# Patient Record
Sex: Female | Born: 1960 | Race: White | Hispanic: No | Smoking: Current every day smoker
Health system: Southern US, Community
[De-identification: ages and names within clinical notes are randomized; demographics above are authoritative.]

## PROBLEM LIST (undated history)

## (undated) DIAGNOSIS — M549 Dorsalgia, unspecified: Secondary | ICD-10-CM

## (undated) DIAGNOSIS — M1990 Unspecified osteoarthritis, unspecified site: Secondary | ICD-10-CM

## (undated) DIAGNOSIS — M79604 Pain in right leg: Secondary | ICD-10-CM

## (undated) DIAGNOSIS — E785 Hyperlipidemia, unspecified: Secondary | ICD-10-CM

## (undated) DIAGNOSIS — J449 Chronic obstructive pulmonary disease, unspecified: Secondary | ICD-10-CM

## (undated) DIAGNOSIS — M25559 Pain in unspecified hip: Secondary | ICD-10-CM

## (undated) DIAGNOSIS — F419 Anxiety disorder, unspecified: Secondary | ICD-10-CM

## (undated) DIAGNOSIS — R0600 Dyspnea, unspecified: Secondary | ICD-10-CM

## (undated) DIAGNOSIS — F329 Major depressive disorder, single episode, unspecified: Secondary | ICD-10-CM

## (undated) DIAGNOSIS — B009 Herpesviral infection, unspecified: Secondary | ICD-10-CM

## (undated) DIAGNOSIS — Z720 Tobacco use: Secondary | ICD-10-CM

## (undated) DIAGNOSIS — M5430 Sciatica, unspecified side: Secondary | ICD-10-CM

## (undated) DIAGNOSIS — F32A Depression, unspecified: Secondary | ICD-10-CM

## (undated) HISTORY — DX: Anxiety disorder, unspecified: F41.9

## (undated) HISTORY — DX: Hyperlipidemia, unspecified: E78.5

## (undated) HISTORY — DX: Dorsalgia, unspecified: M54.9

## (undated) HISTORY — DX: Herpesviral infection, unspecified: B00.9

## (undated) HISTORY — PX: BUNIONECTOMY: SHX129

## (undated) HISTORY — PX: COLONOSCOPY W/ POLYPECTOMY: SHX1380

## (undated) HISTORY — DX: Depression, unspecified: F32.A

## (undated) HISTORY — DX: Pain in unspecified hip: M25.559

## (undated) HISTORY — PX: ELBOW SURGERY: SHX618

## (undated) HISTORY — DX: Pain in right leg: M79.604

## (undated) HISTORY — DX: Tobacco use: Z72.0

## (undated) HISTORY — PX: NOVASURE ABLATION: SHX5394

## (undated) HISTORY — DX: Sciatica, unspecified side: M54.30

## (undated) HISTORY — DX: Major depressive disorder, single episode, unspecified: F32.9

## (undated) HISTORY — PX: RHYTIDECTOMY NECK / CHEEK / CHIN: SUR1286

## (undated) HISTORY — PX: TONSILLECTOMY: SUR1361

## (undated) HISTORY — PX: TUBAL LIGATION: SHX77

## (undated) HISTORY — PX: CATARACT EXTRACTION: SUR2

---

## 1998-01-04 ENCOUNTER — Other Ambulatory Visit: Admission: RE | Admit: 1998-01-04 | Discharge: 1998-01-04 | Payer: Self-pay | Admitting: Family Medicine

## 1998-11-09 ENCOUNTER — Other Ambulatory Visit: Admission: RE | Admit: 1998-11-09 | Discharge: 1998-11-09 | Payer: Self-pay | Admitting: Podiatry

## 2001-03-05 ENCOUNTER — Other Ambulatory Visit: Admission: RE | Admit: 2001-03-05 | Discharge: 2001-03-05 | Payer: Self-pay | Admitting: Family Medicine

## 2001-03-24 ENCOUNTER — Encounter: Payer: Self-pay | Admitting: Obstetrics and Gynecology

## 2001-03-24 ENCOUNTER — Ambulatory Visit (HOSPITAL_COMMUNITY): Admission: RE | Admit: 2001-03-24 | Discharge: 2001-03-24 | Payer: Self-pay | Admitting: Obstetrics and Gynecology

## 2001-05-07 ENCOUNTER — Encounter (INDEPENDENT_AMBULATORY_CARE_PROVIDER_SITE_OTHER): Payer: Self-pay | Admitting: Specialist

## 2001-05-07 ENCOUNTER — Ambulatory Visit (HOSPITAL_COMMUNITY): Admission: RE | Admit: 2001-05-07 | Discharge: 2001-05-07 | Payer: Self-pay | Admitting: Obstetrics and Gynecology

## 2002-05-06 ENCOUNTER — Other Ambulatory Visit: Admission: RE | Admit: 2002-05-06 | Discharge: 2002-05-06 | Payer: Self-pay | Admitting: Obstetrics and Gynecology

## 2002-05-19 ENCOUNTER — Encounter: Payer: Self-pay | Admitting: Obstetrics and Gynecology

## 2002-05-19 ENCOUNTER — Ambulatory Visit (HOSPITAL_COMMUNITY): Admission: RE | Admit: 2002-05-19 | Discharge: 2002-05-19 | Payer: Self-pay | Admitting: Obstetrics and Gynecology

## 2002-05-26 ENCOUNTER — Encounter: Payer: Self-pay | Admitting: Obstetrics and Gynecology

## 2002-05-26 ENCOUNTER — Encounter: Admission: RE | Admit: 2002-05-26 | Discharge: 2002-05-26 | Payer: Self-pay | Admitting: Obstetrics and Gynecology

## 2005-12-29 ENCOUNTER — Emergency Department (HOSPITAL_COMMUNITY): Admission: EM | Admit: 2005-12-29 | Discharge: 2005-12-29 | Payer: Self-pay | Admitting: Emergency Medicine

## 2006-06-30 ENCOUNTER — Emergency Department (HOSPITAL_COMMUNITY): Admission: EM | Admit: 2006-06-30 | Discharge: 2006-06-30 | Payer: Self-pay | Admitting: Emergency Medicine

## 2006-09-20 ENCOUNTER — Encounter (INDEPENDENT_AMBULATORY_CARE_PROVIDER_SITE_OTHER): Payer: Self-pay | Admitting: Obstetrics and Gynecology

## 2006-09-20 ENCOUNTER — Ambulatory Visit (HOSPITAL_COMMUNITY): Admission: RE | Admit: 2006-09-20 | Discharge: 2006-09-20 | Payer: Self-pay | Admitting: Obstetrics and Gynecology

## 2007-07-28 ENCOUNTER — Emergency Department (HOSPITAL_COMMUNITY): Admission: EM | Admit: 2007-07-28 | Discharge: 2007-07-29 | Payer: Self-pay | Admitting: Emergency Medicine

## 2007-07-30 ENCOUNTER — Ambulatory Visit: Payer: Self-pay | Admitting: Cardiovascular Disease

## 2007-08-18 ENCOUNTER — Encounter: Payer: Self-pay | Admitting: Cardiovascular Disease

## 2007-08-18 ENCOUNTER — Ambulatory Visit: Payer: Self-pay

## 2008-06-18 ENCOUNTER — Encounter: Payer: Self-pay | Admitting: Internal Medicine

## 2009-03-12 ENCOUNTER — Emergency Department (HOSPITAL_COMMUNITY): Admission: EM | Admit: 2009-03-12 | Discharge: 2009-03-12 | Payer: Self-pay | Admitting: Emergency Medicine

## 2009-05-17 ENCOUNTER — Encounter (INDEPENDENT_AMBULATORY_CARE_PROVIDER_SITE_OTHER): Payer: Self-pay | Admitting: *Deleted

## 2009-06-16 ENCOUNTER — Ambulatory Visit: Payer: Self-pay | Admitting: Gastroenterology

## 2009-06-16 DIAGNOSIS — K625 Hemorrhage of anus and rectum: Secondary | ICD-10-CM | POA: Insufficient documentation

## 2010-01-31 ENCOUNTER — Encounter (INDEPENDENT_AMBULATORY_CARE_PROVIDER_SITE_OTHER): Payer: Self-pay | Admitting: *Deleted

## 2010-02-12 ENCOUNTER — Encounter: Payer: Self-pay | Admitting: Obstetrics and Gynecology

## 2010-02-21 NOTE — Letter (Signed)
Summary: [facility] Instructions  [facility]  [address] [place], [place] [address]   Phone: [phone]  Fax: [phone]       [name]    49-Nov-1962    MRN: [id number]        Procedure Day /Date:FRIDAY 07/01/2009     Arrival Time:8AM     Procedure Time:9AM     Location of Procedure:                    X   [facility] (4th Floor)   PREPARATION FOR COLONOSCOPY WITH MOVIPREP   Starting 5 days prior to your procedure 06/26/2009 do not eat nuts, seeds, popcorn, corn, beans, peas,  salads, or any raw vegetables.  Do not take any fiber supplements (e.g. Metamucil, Citrucel, and Benefiber).  THE DAY BEFORE YOUR PROCEDURE         DATE:06/30/2009  LKG:[id number]  1.  Drink clear liquids the entire day-NO SOLID FOOD  2.  Do not drink anything colored red or purple.  Avoid juices with pulp.  No orange juice.  3.  Drink at least 64 oz. (8 glasses) of fluid/clear liquids during the day to prevent dehydration and help the prep work efficiently.  CLEAR LIQUIDS INCLUDE: Water Jello Ice Popsicles Tea (sugar ok, no milk/cream) Powdered fruit flavored drinks Coffee (sugar ok, no milk/cream) Gatorade Juice: apple, white grape, white cranberry  Lemonade Clear bullion, consomm, broth Carbonated beverages (any kind) Strained chicken noodle soup Hard Candy                             4.  In the morning, mix first dose of MoviPrep solution:    Empty 1 Pouch A and 1 Pouch B into the disposable container    Add lukewarm drinking water to the top line of the container. Mix to dissolve    Refrigerate (mixed solution should be used within 24 hrs)  5.  Begin drinking the prep at 5:00 p.m. The MoviPrep container is divided by 4 marks.   Every 15 minutes drink the solution down to the next mark (approximately 8 oz) until the full liter is complete.   6.  Follow completed prep with 16 oz of clear liquid of your choice (Nothing red or purple).  Continue to  drink clear liquids until bedtime.  7.  Before going to bed, mix second dose of MoviPrep solution:    Empty 1 Pouch A and 1 Pouch B into the disposable container    Add lukewarm drinking water to the top line of the container. Mix to dissolve    Refrigerate  THE DAY OF YOUR PROCEDURE      DATE: 07/01/2009 DAY: FRIDAY  Beginning at 4a.m. (5 hours before procedure):         1. Every 15 minutes, drink the solution down to the next mark (approx 8 oz) until the full liter is complete.  2. Follow completed prep with 16 oz. of clear liquid of your choice.    3. You may drink clear liquids until 7AM (2 HOURS BEFORE PROCEDURE).   MEDICATION INSTRUCTIONS  Unless otherwise instructed, you should take regular prescription medications with a small sip of water   as early as possible the morning of your procedure.        OTHER INSTRUCTIONS  You will need a responsible adult at least 49 years of age to accompany you and drive you  home.   This person must remain in the waiting room during your procedure.  Wear loose fitting clothing that is easily removed.  Leave jewelry and other valuables at home.  However, you may wish to bring a book to read or  an iPod/MP3 player to listen to music as you wait for your procedure to start.  Remove all body piercing jewelry and leave at home.  Total time from sign-in until discharge is approximately 2-3 hours.  You should go home directly after your procedure and rest.  You can resume normal activities the  day after your procedure.  The day of your procedure you should not:   Drive   Make legal decisions   Operate machinery   Drink alcohol   Return to work  You will receive specific instructions about eating, activities and medications before you leave.    The above instructions have been reviewed and explained to me by   _______________________    I fully understand and can verbalize these instructions  _____________________________ Date _________

## 2010-02-21 NOTE — Letter (Signed)
Summary: New Patient letter  [facility]  [address] [place], [place] [address]   Phone: [phone]  Fax: [phone]       05/17/2009 MRN: [id number]  [name] [address] [place], [place]  [address]  Dear Ms. Hoos,  Welcome to the Gastroenterology Division at [facility].    You are scheduled to see Dr. Arlyce Dice on 06/15/2009 at 9:00AM on the 3rd floor at [facility], [address].  We ask that you try to arrive at our office 15 minutes prior to your appointment time to allow for check-in.  We would like you to complete the enclosed self-administered evaluation form prior to your visit and bring it with you on the day of your appointment.  We will review it with you.  Also, please bring a complete list of all your medications or, if you prefer, bring the medication bottles and we will list them.  Please bring your insurance card so that we may make a copy of it.  If your insurance requires a referral to see a specialist, please bring your referral form from your primary care physician.  Co-payments are due at the time of your visit and may be paid by cash, check or credit card. *CO-PAY FOR MEDICAID IS $3.00     Your office visit will consist of a consult with your physician (includes a physical exam), any laboratory testing he/she may order, scheduling of any necessary diagnostic testing (e.g. x-ray, ultrasound, CT-scan), and scheduling of a procedure (e.g. Endoscopy, Colonoscopy) if required.  Please allow enough time on your schedule to allow for any/all of these possibilities.    If you cannot keep your appointment, please call [phone] to cancel or reschedule prior to your appointment date.  This allows [place] the opportunity to schedule an appointment for another patient in need of care.  If you do not cancel or reschedule by 5 p.m. the business day prior to your appointment date, you will be charged a $50.00 late cancellation/no-show fee.     Thank you for choosing [facility] for your medical needs.  We appreciate the opportunity to care for you.  Please visit [place] at our website  to learn more about our practice.                     Sincerely,                                                             The Gastroenterology Division

## 2010-02-21 NOTE — Letter (Signed)
Summary: Pre Visit No Show Letter  [facility]  [address] [place], [place] [address]   Phone: [phone]  Fax: [phone]        Jun 18, 2008 MRN: [id number]    [name] [address] [place], [place]  [address]    Dear Ms. Rigsby,   We have been unable to reach you by phone concerning the pre-procedure visit that you missed on 06/18/2008. For this reason,your procedure scheduled on 07/02/2008 has been cancelled. Our scheduling staff will gladly assist you with rescheduling your appointments at a more convenient time. Please call our office at [phone] between the hours of 8:00am and 5:00pm, press option #2 to reach an appointment scheduler. Please consider updating your contact numbers at this time so that we can reach you by phone in the future with schedule changes or results.    Thank you,    Wyona Almas RN [facility]

## 2010-02-21 NOTE — Assessment & Plan Note (Signed)
Summary: discuss colon//weight loss--ch.   History of Present Illness Visit Type: new patient  Primary GI MD: Melvia Heaps MD [facility] Primary Provider: [facility]  Requesting Provider: na Chief Complaint: Consult colon. Pt c/o weight loss and rectal bleeding  History of Present Illness:   [name] is a 50 year old white female referred at the request of Dr. Christell Constant for colonoscopy.  Family history is pertinent for her 54 year old sister who had colon cancer.  [name] has noted episodes of painless bright red blood per rectum consistenting of blood mixed with the stools and on the toilet tissue,  which she attributes to hemorrhoids.  She denies change in bowel habits, abdominal or rectal pain.   GI Review of Systems    Reports bloating, chest pain, and  loss of appetite.      Denies abdominal pain, acid reflux, belching, dysphagia with liquids, dysphagia with solids, heartburn, nausea, vomiting, vomiting blood, weight loss, and  weight gain.      Reports hemorrhoids and  rectal pain.     Denies anal fissure, black tarry stools, change in bowel habit, constipation, diarrhea, diverticulosis, fecal incontinence, heme positive stool, irritable bowel syndrome, jaundice, light color stool, liver problems, and  rectal bleeding.    Current Medications (verified): 1)  Simvastatin 80 Mg Tabs (Simvastatin) .... 1/2 Tablet At Bedtime 2)  Zoloft 100 Mg Tabs (Sertraline Hcl) .... 1/2 Once Daily 3)  Xanax 1 Mg Tabs (Alprazolam) .... 1/2 Every 8-12 Hours As Needed 4)  Aspirin 81 Mg Tbec (Aspirin) .... One Tablet By Mouth Once Daily  Allergies (verified): 1)  ! Codeine 2)  ! Demerol  Past History:  Past Medical History: Anxiety Disorder Depression Hyperlipidemia  Past Surgical History: 2 C-Section Right Arm Surgery  Tonsillectomy Surgery on Mouth x 2   Family History: Family History of Colon Cancer: SIster  Family History of Irritable Bowel Syndrome:Sister    Family History of Diabetes: Paternal and Maternal Side of family  Social History: Occupation: Unemployed Divorced 2 childern Patient currently smokes.  Alcohol Use - no Daily Caffeine Use: 12 glasses  daily  Illicit Drug Use - no Smoking Status:  current Drug Use:  no  Review of Systems       The patient complains of anxiety-new, blood in urine, change in vision, fatigue, headaches-new, hearing problems, itching, night sweats, shortness of breath, skin rash, sleeping problems, sore throat, thirst - excessive, urination - excessive, urine leakage, and vision changes.  The patient denies allergy/sinus, anemia, arthritis/joint pain, back pain, breast changes/lumps, confusion, cough, coughing up blood, depression-new, fainting, fever, heart murmur, heart rhythm changes, menstrual pain, muscle pains/cramps, nosebleeds, pregnancy symptoms, swelling of feet/legs, swollen lymph glands, thirst - excessive , urination - excessive , urination changes/pain, and voice change.         All other systems were reviewed and were negative   Vital Signs:  Patient profile:   50 year old female Height:      63 inches Weight:      137 pounds BMI:     24.36 BSA:     1.65 Pulse rate:   76 / minute Pulse rhythm:   regular BP sitting:   98 / 64  (left arm) Cuff size:   regular  Vitals Entered By: Ok Anis CMA (Jun 16, 2009 9:36 AM)  Physical Exam  Additional Exam:  on physical exam she is a healthy-appearing female  skin: anicteric HEENT: normocephalic; PEERLA; no nasal or pharyngeal abnormalities neck: supple nodes:  no cervical lymphadenopathy chest: clear to ausculatation and percussion heart: no murmurs, gallops, or rubs abd: soft, nontender; BS normoactive; no abdominal masses, tenderness, organomegaly rectal: deferred ext: no cynanosis, clubbing, edema skeletal: no deformities neuro: oriented x 3; no focal abnormalities    Impression & Recommendations:  Problem # 1:  FM HX  MALIGNANT NEOPLASM GASTROINTESTINAL TRACT (ICD-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  Patient's questions were answered.  Orders: Colonoscopy (Colon)  Problem # 2:  HEMORRHAGE OF RECTUM AND ANUS (ICD-569.3)  By history this is probably hemorrhoidal bleeding.  A more proximal colonic bleeding source should be ruled out.  Recommendations #1 colonoscopy  Orders: Colonoscopy (Colon)  Patient Instructions: 1)  Colonoscopy and Flexible Sigmoidoscopy brochure given.  2)  Conscious Sedation brochure given.  3)  Your Colonoscopy is scheduled for 07/01/2009 at 9am on the 4th floor 4)  We are sending in your MoviPrep to your pharmacy today 5)  CC Dr. Vernon Prey 6)  The medication list was reviewed and reconciled.  All changed / newly prescribed medications were explained.  A complete medication list was provided to the patient / caregiver. Prescriptions: MOVIPREP 100 GM  SOLR (PEG-KCL-NACL-NASULF-NA ASC-C) As per prep instructions.  #1 x 0   Entered by:   Merri Ray CMA (AAMA)   Authorized by:   Louis Meckel MD   Signed by:   Merri Ray CMA (AAMA) on 06/16/2009   Method used:   Electronically to        [facility] [phone]* (retail)       [address]       [place], [place]  [address]       Ph: [phone] or [phone]       Fax: [phone]   RxID:   [id number]

## 2010-02-21 NOTE — Letter (Signed)
Summary: Results Letter  [facility]  [address] [place], [place] [address]   Phone: [phone]  Fax: [phone]        Jun 16, 2009 MRN: [id number]    [name] [address] [place], [place]  [address]    Dear [name],  It is my pleasure to have treated you recently as a new patient in my office. I appreciate your confidence and the opportunity to participate in your care.  Since I do have a busy inpatient endoscopy schedule and office schedule, my office hours vary weekly. I am, however, available for emergency calls everyday through my office. If I am not available for an urgent office appointment, another one of our gastroenterologist will be able to assist you.  My well-trained staff are prepared to help you at all times. For emergencies after office hours, a physician from our Gastroenterology section is always available through my 24 hour answering service  Once again I welcome you as a new patient and I look forward to a happy and healthy relationship             Sincerely,  Louis Meckel MD  This letter has been electronically signed by your physician.  Appended Document: Results Letter letter mailed

## 2010-02-23 ENCOUNTER — Encounter: Payer: Self-pay | Admitting: Gastroenterology

## 2010-02-23 ENCOUNTER — Ambulatory Visit: Admit: 2010-02-23 | Payer: Self-pay | Admitting: Gastroenterology

## 2010-02-23 NOTE — Letter (Signed)
Summary: Pre Visit Letter Revised  [facility]  [address] [place], [place] [address]   Phone: [phone]  Fax: [phone]        01/31/2010 MRN: [id number] [name] [address] [place], [place]  [address]             Procedure Date:  03-09-10   Welcome to the Gastroenterology Division at [facility].    You are scheduled to see a nurse for your pre-procedure visit on 02-23-10 at 1:30p.m. on the 3rd floor at [facility], [address].  We ask that you try to arrive at our office 15 minutes prior to your appointment time to allow for check-in.  Please take a minute to review the attached form.  If you answer "Yes" to one or more of the questions on the first page, we ask that you call the person listed at your earliest opportunity.  If you answer "No" to all of the questions, please complete the rest of the form and bring it to your appointment.    Your nurse visit will consist of discussing your medical and surgical history, your immediate family medical history, and your medications.   If you are unable to list all of your medications on the form, please bring the medication bottles to your appointment and we will list them.  We will need to be aware of both prescribed and over the counter drugs.  We will need to know exact dosage information as well.    Please be prepared to read and sign documents such as consent forms, a financial agreement, and acknowledgement forms.  If necessary, and with your consent, a friend or relative is welcome to sit-in on the nurse visit with you.  Please bring your insurance card so that we may make a copy of it.  If your insurance requires a referral to see a specialist, please bring your referral form from your primary care physician.  No co-pay is required for this nurse visit.     If you cannot keep your appointment, please call [phone] to cancel or reschedule prior to your appointment date.  This allows  [place] the opportunity to schedule an appointment for another patient in need of care.    Thank you for choosing [facility] for your medical needs.  We appreciate the opportunity to care for you.  Please visit [place] at our website  to learn more about our practice.  Sincerely, The Gastroenterology Division

## 2010-03-01 NOTE — Letter (Signed)
Summary: Pre Visit Letter Revised  [facility]  [address] [place], [place] [address]   Phone: [phone]  Fax: [phone]        02/23/2010 MRN: [id number]  [name] [address] [place], [place]  [address]             Procedure Date:  03-23-10 10am            Dr Arlyce Dice - Direct Colon   Welcome to the Gastroenterology Division at [facility].    You are scheduled to see a nurse for your pre-procedure visit on 03-06-10 at 1pm on the 3rd floor at [facility], [address].  We ask that you try to arrive at our office 15 minutes prior to your appointment time to allow for check-in.  Please take a minute to review the attached form.  If you answer "Yes" to one or more of the questions on the first page, we ask that you call the person listed at your earliest opportunity.  If you answer "No" to all of the questions, please complete the rest of the form and bring it to your appointment.    Your nurse visit will consist of discussing your medical and surgical history, your immediate family medical history, and your medications.   If you are unable to list all of your medications on the form, please bring the medication bottles to your appointment and we will list them.  We will need to be aware of both prescribed and over the counter drugs.  We will need to know exact dosage information as well.    Please be prepared to read and sign documents such as consent forms, a financial agreement, and acknowledgement forms.  If necessary, and with your consent, a friend or relative is welcome to sit-in on the nurse visit with you.  Please bring your insurance card so that we may make a copy of it.  If your insurance requires a referral to see a specialist, please bring your referral form from your primary care physician.  No co-pay is required for this nurse visit.     If you cannot keep your appointment, please call [phone] to cancel or reschedule prior  to your appointment date.  This allows [place] the opportunity to schedule an appointment for another patient in need of care.    Thank you for choosing [facility] for your medical needs.  We appreciate the opportunity to care for you.  Please visit [place] at our website  to learn more about our practice.  Sincerely, The Gastroenterology Division

## 2010-03-06 ENCOUNTER — Encounter (INDEPENDENT_AMBULATORY_CARE_PROVIDER_SITE_OTHER): Payer: Self-pay | Admitting: *Deleted

## 2010-03-08 ENCOUNTER — Encounter (INDEPENDENT_AMBULATORY_CARE_PROVIDER_SITE_OTHER): Payer: Self-pay | Admitting: *Deleted

## 2010-03-09 ENCOUNTER — Other Ambulatory Visit: Payer: Self-pay | Admitting: Gastroenterology

## 2010-03-09 ENCOUNTER — Encounter: Payer: Self-pay | Admitting: Gastroenterology

## 2010-03-15 NOTE — Letter (Signed)
Summary: [facility] Instructions  [facility]  [address] [place], [place] [address]   Phone: [phone]  Fax: [phone]       [name]    1960/02/18    MRN: [id number]        Procedure Day Dorna Bloom:  Lenor Coffin  03/23/10     Arrival Time:  8:00AM     Procedure Time:  9:00AM     Location of Procedure:                    _ X_   [facility]oscopy Center (4th Floor)                      PREPARATION FOR COLONOSCOPY WITH MOVIPREP   Starting 5 days prior to your procedure 03/18/10 do not eat nuts, seeds, popcorn, corn, beans, peas,  salads, or any raw vegetables.  Do not take any fiber supplements (e.g. Metamucil, Citrucel, and Benefiber).  THE DAY BEFORE YOUR PROCEDURE         DATE: 03/22/10  DAY: WEDNESDAY  1.  Drink clear liquids the entire day-NO SOLID FOOD  2.  Do not drink anything colored red or purple.  Avoid juices with pulp.  No orange juice.  3.  Drink at least 64 oz. (8 glasses) of fluid/clear liquids during the day to prevent dehydration and help the prep work efficiently.  CLEAR LIQUIDS INCLUDE: Water Jello Ice Popsicles Tea (sugar ok, no milk/cream) Powdered fruit flavored drinks Coffee (sugar ok, no milk/cream) Gatorade Juice: apple, white grape, white cranberry  Lemonade Clear bullion, consomm, broth Carbonated beverages (any kind) Strained chicken noodle soup Hard Candy                             4.  In the morning, mix first dose of MoviPrep solution:    Empty 1 Pouch A and 1 Pouch B into the disposable container    Add lukewarm drinking water to the top line of the container. Mix to dissolve    Refrigerate (mixed solution should be used within 24 hrs)  5.  Begin drinking the prep at 5:00 p.m. The MoviPrep container is divided by 4 marks.   Every 15 minutes drink the solution down to the next mark (approximately 8 oz) until the full liter is complete.   6.  Follow completed prep with 16 oz of clear liquid of your choice (Nothing  red or purple).  Continue to drink clear liquids until bedtime.  7.  Before going to bed, mix second dose of MoviPrep solution:    Empty 1 Pouch A and 1 Pouch B into the disposable container    Add lukewarm drinking water to the top line of the container. Mix to dissolve    Refrigerate  THE DAY OF YOUR PROCEDURE      DATE: 03/23/10  DAY: THURSDAY  Beginning at 4:00AM (5 hours before procedure):         1. Every 15 minutes, drink the solution down to the next mark (approx 8 oz) until the full liter is complete.  2. Follow completed prep with 16 oz. of clear liquid of your choice.    3. You may drink clear liquids until 7:00AM (2 HOURS BEFORE PROCEDURE).   MEDICATION INSTRUCTIONS  Unless otherwise instructed, you should take regular prescription medications with a small sip of water   as early as possible the morning of your  procedure.        OTHER INSTRUCTIONS  You will need a responsible adult at least 49 years of age to accompany you and drive you home.   This person must remain in the waiting room during your procedure.  Wear loose fitting clothing that is easily removed.  Leave jewelry and other valuables at home.  However, you may wish to bring a book to read or  an iPod/MP3 player to listen to music as you wait for your procedure to start.  Remove all body piercing jewelry and leave at home.  Total time from sign-in until discharge is approximately 2-3 hours.  You should go home directly after your procedure and rest.  You can resume normal activities the  day after your procedure.  The day of your procedure you should not:   Drive   Make legal decisions   Operate machinery   Drink alcohol   Return to work  You will receive specific instructions about eating, activities and medications before you leave.    The above instructions have been reviewed and explained to me by   Karl Bales RN  March 09, 2010 10:47 AM    I fully understand and  can verbalize these instructions _____________________________ Date _________

## 2010-03-15 NOTE — Letter (Signed)
Summary: Pre Visit No Show Letter  [facility]  [address] [place], [place] [address]   Phone: [phone]  Fax: [phone]        March 06, 2010 MRN: [id number]    [name] [address] [place], [place]  [address]    Dear [name],   We have been unable to reach you by phone concerning the pre-procedure visit that you missed on Monday 03-06-10 . For this reason,your procedure scheduled on Thursday 03-23-10 has been cancelled. Our scheduling staff will gladly assist you with rescheduling your appointments at a more convenient time. Please call our office at [phone] between the hours of 8:00am and 5:00pm, press option #2 to reach an appointment scheduler. Please consider updating your contact numbers at this time so that we can reach you by phone in the future with schedule changes or results.    Thank you,    Sherren Kerns RN [facility]

## 2010-03-15 NOTE — Miscellaneous (Signed)
Summary: [facility] previsit.  Clinical Lists Changes  Observations: Added new observation of ALLERGY REV: Done (03/09/2010 10:17)    Pt already has Moviprep from prior cancelled procedure.  No rx sent to pharmacy

## 2010-03-23 ENCOUNTER — Other Ambulatory Visit: Payer: Self-pay | Admitting: Gastroenterology

## 2010-03-23 ENCOUNTER — Other Ambulatory Visit (AMBULATORY_SURGERY_CENTER): Payer: Medicaid Other | Admitting: Gastroenterology

## 2010-03-23 DIAGNOSIS — D126 Benign neoplasm of colon, unspecified: Secondary | ICD-10-CM

## 2010-03-23 DIAGNOSIS — Z800 Family history of malignant neoplasm of digestive organs: Secondary | ICD-10-CM

## 2010-03-23 DIAGNOSIS — K5730 Diverticulosis of large intestine without perforation or abscess without bleeding: Secondary | ICD-10-CM

## 2010-03-23 DIAGNOSIS — Z1211 Encounter for screening for malignant neoplasm of colon: Secondary | ICD-10-CM

## 2010-03-30 ENCOUNTER — Encounter: Payer: Self-pay | Admitting: Gastroenterology

## 2010-03-30 NOTE — Procedures (Addendum)
Summary: Colonoscopy  Patient: [name] Note: All result statuses are Final unless otherwise noted.  Tests: (1) Colonoscopy (COL)   COL Colonoscopy           DONE     [facility]     [address]     [place], [place]  [address]          COLONOSCOPY PROCEDURE REPORT          PATIENT:  Julie, Salazar  MR#:  [id number]     BIRTHDATE:  [birth year], 49 yrs. old  GENDER:  female          ENDOSCOPIST:  Barbette Hair. Arlyce Dice, MD     Referred by:  Rudi Heap, M.D.          PROCEDURE DATE:  03/23/2010     PROCEDURE:  Colonoscopy with snare polypectomy     ASA CLASS:  Class I     INDICATIONS:  1) Elevated Risk Screening  2) family history of     colon cancer Sister with colon Ca in 40's          MEDICATIONS:   Fentanyl 50 mcg IV, Versed 7 mg IV, Benadryl 25 mg     IV          DESCRIPTION OF PROCEDURE:   After the risks benefits and     alternatives of the procedure were thoroughly explained, informed     consent was obtained.  Digital rectal exam was performed and     revealed no abnormalities.   The [id number] [id number] endoscope was     introduced through the anus and advanced to the cecum, which was     identified by both the appendix and ileocecal valve, without     limitations.  The quality of the prep was excellent, using     MoviPrep.  The instrument was then slowly withdrawn as the colon     was fully examined.     <<PROCEDUREIMAGES>>          FINDINGS:  A sessile polyp was found in the descending colon. It     was 3 mm in size. Flat polyp Polyp was snared without cautery.     Retrieval was successful (see image7). snare polyp  Mild     diverticulosis was found in the sigmoid colon (see image8).  This     was otherwise a normal examination of the colon (see image2,     image4, image10, and image12).   Retroflexed views in the rectum     revealed no abnormalities.    The time to cecum =  5.0  minutes.     The scope was then withdrawn (time =  8.75  min) from the  patient     and the procedure completed.          COMPLICATIONS:  None          ENDOSCOPIC IMPRESSION:     1) 3 mm sessile polyp in the descending colon     2) Mild diverticulosis in the sigmoid colon     3) Otherwise normal examination     RECOMMENDATIONS:     1) Given your significant family history of colon cancer, you     should have a repeat colonoscopy in 5 years     2) Sedation with MAC for future procedures          REPEAT EXAM:   5 year(s) Colonoscopy  ______________________________     Barbette Hair Arlyce Dice, MD          CC:          n.     eSIGNED:   Barbette Hair. Rondia Higginbotham at 03/23/2010 09:59 AM          Zena Amos, [id number]  Note: An exclamation mark (!) indicates a result that was not dispersed into the flowsheet. Document Creation Date: 03/23/2010 10:01 AM _______________________________________________________________________  (1) Order result status: Final Collection or observation date-time: 03/23/2010 09:52 Requested date-time:  Receipt date-time:  Reported date-time:  Referring Physician:   Ordering Physician: Melvia Heaps [phone]) Specimen Source:  Source: Launa Grill Order Number: [id number] Lab site:   Appended Document: Colonoscopy     Procedures Next Due Date:    Colonoscopy: 03/2015

## 2010-04-04 NOTE — Letter (Signed)
Summary: Patient Notice- Polyp Results  [facility]  [address] [place], [place] [address]   Phone: [phone]  Fax: [phone]        March 30, 2010 MRN: [id number]    [name] [address] [place], [place]  [address]    Dear [name],  I am pleased to inform you that the colon polyp(s) removed during your recent colonoscopy was (were) found to be benign (no cancer detected) upon pathologic examination.  I recommend you have a repeat colonoscopy examination in 5_ years to look for recurrent polyps, as having colon polyps increases your risk for having recurrent polyps or even colon cancer in the future.  Should you develop new or worsening symptoms of abdominal pain, bowel habit changes or bleeding from the rectum or bowels, please schedule an evaluation with either your primary care physician or with me.  Additional information/recommendations:  __ No further action with gastroenterology is needed at this time. Please      follow-up with your primary care physician for your other healthcare      needs.  __ Please call [phone] to schedule a return visit to review your      situation.  __ Please keep your follow-up visit as already scheduled.  _x_ Continue treatment plan as outlined the day of your exam.  Please call us if you are having persistent problems or have questions about your condition that have not been fully answered at this time.  Sincerely,  Louis Meckel MD  This letter has been electronically signed by your physician.  Appended Document: Patient Notice- Polyp Results letter mailed

## 2010-06-06 NOTE — Consult Note (Signed)
NAMEJANESSA, [name] NO.:  [id number]   MEDICAL RECORD NO.:  [id number]          PATIENT TYPE:  EMS   LOCATION:  [facility]JO                         FACILITY:  [facility]   PHYSICIAN:  Vania Rea, M.D. DATE OF BIRTH:  September 21, 1960   DATE OF CONSULTATION:  07/29/2007  DATE OF DISCHARGE:                                 CONSULTATION   PHYSICIANS REQUESTING CONSULT:  Orlene Och, MD, and Paula Libra, MD.   REASON FOR CONSULTATION:  Chest pain:   IMPRESSION:  Atypical Chest Pain, resolved.  Normal Cardiac Enzymes and Normal EKG 9hrs after onset of pain.  Tobacco abuse.   RECOMMENDATION:  If patient cannot stay for inpatient evaluation, it is safe to discharge  her home with an early appointment for cardiology evaluation.   HISTORY OF PRESENT ILLNESS:  This is a 50 year old Caucasian lady with  no significant past medical history but who does smoke about 1 pack per  day and has done so for about 23 years.  Presented to the emergency room  with a history of sudden onset of chest pain 2-3 hours prior to arrival  which lasted about 10 minutes, radiated into her epigastrium, her jaw,  both axillae and her neck.  She described it as a sharp stabbing,  aggravated by exertion, not relieved by rest, associated with dizziness  and dyspnea.  She was treated prior to EMS evaluation with aspirin and  oxygen, resulting in stabilization.  In the emergency room the patient  was evaluated.  She had EKG which was normal sinus rhythm and her  cardiac enzyme was completely normal.  The original intention was to  admit the patient for cardiac rule-out; however, the patient was held in  the emergency room for 7 hours and she insisted that she wanted to go  home.  Her cardiac enzymes were there repeated, which was approximately  9 hours after the onset of the pain, and they were completely normal.  Since her EKG is normal, her cardiac enzymes are normal and she is  completely symptom-free  without any further treatment, we have advised  her to follow up with her cardiologist for further testing.  Additionally, the patient reports no pain with exertion.  She reports no  orthopnea or dyspnea, no PND.  The patient said she has had a similar  pain before but it was in her left lower quadrant.   PAST MEDICAL HISTORY:  None.   MEDICATIONS:  None.   ALLERGIES:  CODEINE causes itching, also DEMEROL.   FAMILY HISTORY:  Significant for cancer in her brothers but no family  history of coronary artery disease.   SOCIAL HISTORY:  As noted above, she denies alcohol use.   REVIEW OF SYSTEMS:  A 10-point review of systems other than noted above  was negative.   PHYSICAL EXAMINATION:  A pleasant Caucasian lady reclining in the  stretcher in no acute distress.  She has had no pain since she has been  in the emergency room.  VITAL SIGNS:  Her temperature is 98.9, pulse 63, respirations 18, blood  pressure 117/71.  She  is saturating at 99% on room air.  Her mucous membranes are pink and anicteric.  She is not dehydrated.  She has no cervical lymphadenopathy or thyromegaly.  CHEST:  Clear to auscultation bilaterally.  CARDIOVASCULAR:  Regular rhythm without murmur.  ABDOMEN:  Soft and nontender.  EXTREMITIES:  Without edema.   LABS:  Her white count is 13.5, hemoglobin 15.2, platelets 359.  Her  sodium is 138, potassium 4.0, chloride 104, BUN 8, creatinine 1.0,  glucose 98.      Vania Rea, M.D.  Electronically Signed     LC/MEDQ  D:  07/29/2007  T:  07/29/2007  Job:  [id number]   cc:   Paula Libra, MD  Orlene Och, MD

## 2010-06-06 NOTE — Op Note (Signed)
[name], [name]               ACCOUNT NO.:  [id number]   MEDICAL RECORD NO.:  [id number]          PATIENT TYPE:  AMB   LOCATION:  SDC                           FACILITY:  [facility]   PHYSICIAN:  Dois Davenport A. Rivard, M.D. DATE OF BIRTH:  April 12, 1960   DATE OF PROCEDURE:  09/20/2006  DATE OF DISCHARGE:                               OPERATIVE REPORT   PREOPERATIVE DIAGNOSIS:  Dysfunctional uterine bleeding.   POSTOPERATIVE DIAGNOSIS:  Dysfunctional uterine bleeding.   ANESTHESIA:  General, Dr. Jean Rosenthal, and paracervical block, Dr. Estanislado Pandy.   PROCEDURE:  Hysteroscopy D&C, endometrial ablation with NovaSure.   SURGEON:  Dr. Estanislado Pandy; no assistant.   ESTIMATED BLOOD LOSS:  Minimal.   PROCEDURE:  After being informed of the planned procedure with possible  complications including bleeding, infection and injury to uterus,  informed consent was obtained.  The patient was taken to OR #4 and given  general anesthesia with laryngeal mask.  She is placed in the lithotomy  position, prepped and draped in a sterile fashion, and her bladder is  emptied with an in-and-out Foley catheter.  GYN exam reveals an  anteverted uterus normal in size and shape and 2 normal adnexa.  A  weighted speculum is inserted.  Anterior lip of the cervix is grasped  with a tenaculum forceps, and we proceed with a paracervical block using  Novocain 1% 18 mL in the usual fashion.  Uterus is sounded at 9 cm, and  cervix is sounded at 3 cm for a cavity length of 6 cm.  The cervix is  then dilated using Hegar dilator until #29 which allows easy entry of a  Optiscope hysteroscope. The cavity is distended with sorbitol 3% at a  maximum pressure of 90 mmHg.  The cavity appears to be normal.  Both  tubal ostia are visualized and normal.  We do not identify an  endometrial polyp.  Hysteroscope is removed, and a sharp curette is used  to do endometrial curettings, but before endometrial curettings, we  washed out the cavity with LR  for 2 minutes.  The NovaSure is then  inserted inside the cavity and deployed.  The cavity width is 3 cm.  Testing is adequate for cavity integrity, and we proceed with NovaSure  endometrial ablation at a power of 99 for a time of 1 minute 15 seconds.  NovaSure is then retracted and removed.  Hysteroscope is reinserted to  visualize a completely blanched cavity.  Instruments are then removed,  and we note a laceration on the cervix at the site of the tenaculum  forceps, which was repaired with a figure-of-eight stitch of 3-0  chromic.  Instrument and sponge count is complete x2.  Estimated blood  loss is minimal.  Water deficit is 20 mL.  The procedure is very well  tolerated by the patient who is taken to recovery room in a well and  stable condition.   SPECIMEN:  Endometrial curettings sent to pathology.     Crist Fat Rivard, M.D.  Electronically Signed    SAR/MEDQ  D:  09/20/2006  T:  09/21/2006  Job:  [id number] 

## 2010-06-06 NOTE — Assessment & Plan Note (Signed)
[facility]                            CARDIOLOGY OFFICE NOTE   NAME:[name], [name]                      MRN:          [id number]  DATE:07/30/2007                            DOB:          [birth year]    A 50 year old patient referred from [facility] and [facility] for chest pain.   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  The patient was seen in the emergency room 2-3 days ago for chest pain.  The chest pain was atypical.  It started in her epigastric area, rose up  into her chest, and went all the way up into her jaw and her right arm.  There was some associated diaphoresis.  The pain lasted 10 or 15  minutes.  She went to the ER, she ruled out.  There were no acute  changes on her chest x-ray or EKG.  There was a question about some  gallbladder problems and about 2 months ago, she had a CT scan  apparently.  I do not have these results.  She does not describe typical  biliary colic; however, at the time of her chest pain, she did describe  nausea.  There was 1 episode of vomiting.  There was no diarrhea.  There  were no one else sick in the area.  There were no other flu symptoms.   She does describe some myalgias.  Interestingly, some of her symptoms  seem to coincide with the starting of an appetite suppressant.   It is not clear to me what it is.  She said that it is a drug that they  put cancer patients on.  She will call her son at home and see if he can  read the name of the drug over the phone.   The patient has not had a previous cardiac workup or stress test.  There  is no previous history of coronary disease.  She is a nondiabetic.  No  evidence of hypertension.  She does smoke a pack a day since her early  67s.   PAST MEDICAL HISTORY:  Fairly benign.  She smokes.  She has had  abdominal pain of unknown etiology in the past.  She has not had  previous surgery.   FAMILY  HISTORY:  Unremarkable.   The patient is divorced.  She has 2 children; 12 and 16.  She currently  is not working.  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  She is active  during the day.   She smokes a pack a day and denies alcohol intake.   ALLERGIES AND MEDICATIONS:  She has no known allergies and takes no  regular meds.   PHYSICAL EXAMINATION:  GENERAL:  Her exam is remarkable for a bronchitic  female in no distress.  VITAL SIGNS:  Blood pressure is 130/70; pulse 69, regular; respiratory  rate 14, afebrile; affect appropriate.  HEENT:  Unremarkable.  NECK:  Carotids are normal without bruits.  No lymphadenopathy.  No  thyromegaly.  No JVP elevation.  LUNGS:  Good diaphragmatic motion.  No wheezing.  S1 and S2.  HEART:  Normal heart sounds.  PMI normal.  ABDOMEN:  Benign.  Bowel sounds positive.  No right upper quadrant pain.  No hepatosplenomegaly or hepatojugular reflux.  EXTREMITIES:  Distal pulses are intact.  No edema.  NEURO:  Nonfocal.  SKIN:  Warm and dry.  MUSCULOSKELETAL:  No muscular weakness.   DIAGNOSTIC IMAGING:  EKG shows sinus rhythm with right superior axis  consistent with probable mild COPD-type changes.   IMPRESSION:  1. Chest pain, atypical, and a long-time smoker.  She would be a good      candidate for stress echo.  We will schedule this for later in the      week or early next week.  2. Smoking cessation.  I spent less than 10 minutes counseling the      patient regarding her risks of lung cancer.  She already appears to      have some vocal cord irritation from her long smoking.  She will      follow up with [facility].  She would      appear to be a reasonable candidate for Wellbutrin or Chantix.  3. Health maintenance.  The patient needs to follow up with the      [facility] to get a fasting lipid profile.      I would also wonder if she needs a gallbladder ultrasound or      further workup for  biliary colic.  4. Recent institution of a weight pill.  I suspect that a lot of her      symptoms are started with this, and of note, she has had abdominal      cramping with it.  We will get the results of this medicine.      However, I advised her to stop taking it since a lot of her      symptoms seem to coincide with the initiation of this drug.   As long as the patient's stress echo is normal, we will see her on an as-  needed basis.     Noralyn Pick. Eden Emms, MD, [facility]  Electronically Signed    PCN/MedQ  DD: 07/30/2007  DT: 07/30/2007  Job #: [id number]

## 2010-06-09 NOTE — Op Note (Signed)
[facility]  Patient:    [name], [name] Visit Number: [id number] MRN: [id number]          Service Type: DSU Location: [facility] Attending Physician:  Esmeralda Arthur Dictated by:   Silverio Lay, M.D. Proc. Date: 05/07/01 Admit Date:  05/07/2001                             Operative Report  PREOPERATIVE DIAGNOSIS:       Dysfunctional uterine bleeding with endometrial polyp on sonohystogram.  POSTOPERATIVE DIAGNOSIS:      Dysfunctional uterine bleeding with endometrial polyp on sonohystogram.  PROCEDURE:                    Hysteroscopy with resection of polyp, dilation and curettage.  SURGEON:                      Silverio Lay, M.D.  ANESTHESIA:                   General.  ESTIMATED BLOOD LOSS:         Minimal.  DESCRIPTION OF PROCEDURE:     After being informed of the planned procedure with the possible complications including bleeding, infection, uterine perforation with the possibility of organ injury and the necessity of laparoscopy or laparotomy, informed consent was obtained.  The patient was taken to OR #3 and given general anesthesia with laryngeal mask.  She was placed in the lithotomy position, prepped and draped in sterile fashion and her bladder was emptied with an in-and-out catheter.  GYN exam revealed an anteverted, normal volume uterus and two normal adnexa.  A weighted speculum was inserted.  The anterior lip of the cervix was grasped with tenaculum forceps.  The uterus was sounded at 9.5 cm.  We proceeded with dilation of the cervix with Hegar dilator at #33, which allowed easy entry of the operative hysteroscope under direct visualization with perfusion of sorbitol at 90 mmHg maximum pressure.  Visualization of the intrauterine cavity revealed a thickened endometrium overall with a right cornual polyp measuring approximately 1.5 cm.  The tubal ostia were impossible to visualize.  Because of the fullness of the endometrium,  we had to remove the hysteroscope, proceed with a first pass curettage using a sharp curet and continue with the procedure.  We were then able to visualize the cavity and resect the polyp using the resectoscope double looped.  At the end of the procedure, pressure was reduced to 60 mmHg to visualize any bleeding sites, which were cauterized. We were then able to visualize both tubal ostia.  The overall view of the cavity was then completely normal.  The instruments were removed.  Instrument and sponge counts were complete x2.  Estimated blood loss was minimal.  Fluid deficit was -40 cc.  The procedure was very well tolerated by the patient, who was taken to the recovery room in well and stable condition. Dictated by:   Silverio Lay, M.D. Attending Physician:  Esmeralda Arthur DD:  05/07/01 TD:  05/07/01 Job: [id number]

## 2010-10-19 LAB — CBC
HCT: 44.1
Hemoglobin: 15.2 — ABNORMAL HIGH
MCHC: 34.5
MCV: 97
Platelets: 359
RBC: 4.55
RDW: 15
WBC: 13.5 — ABNORMAL HIGH

## 2010-10-19 LAB — DIFFERENTIAL
Basophils Absolute: 0.1
Basophils Relative: 1
Eosinophils Absolute: 0.1
Eosinophils Relative: 1
Lymphocytes Relative: 28
Lymphs Abs: 3.7
Monocytes Absolute: 0.9
Monocytes Relative: 7
Neutro Abs: 8.7 — ABNORMAL HIGH
Neutrophils Relative %: 64

## 2010-10-19 LAB — POCT I-STAT, CHEM 8
BUN: 8
Calcium, Ion: 1.16
Chloride: 104
Creatinine, Ser: 1
Glucose, Bld: 98
HCT: 46
Hemoglobin: 15.6 — ABNORMAL HIGH
Potassium: 4
Sodium: 138
TCO2: 27

## 2010-10-19 LAB — CK TOTAL AND CKMB (NOT AT ARMC)
CK, MB: 1.3
Relative Index: INVALID
Total CK: 85

## 2010-10-19 LAB — TROPONIN I: Troponin I: <0.01

## 2010-10-19 LAB — POCT CARDIAC MARKERS
CKMB, poc: <1 — ABNORMAL LOW
Myoglobin, poc: 34.3
Operator id: 277751
Troponin i, poc: <0.05

## 2010-11-03 LAB — CBC
HCT: 39.3
Hemoglobin: 13.8
MCHC: 35
MCV: 94
Platelets: 324
RBC: 4.19
RDW: 13.8
WBC: 10.6 — ABNORMAL HIGH

## 2012-04-01 ENCOUNTER — Encounter: Payer: Medicaid Other | Admitting: Obstetrics & Gynecology

## 2012-04-08 ENCOUNTER — Encounter: Payer: Medicaid Other | Admitting: Obstetrics & Gynecology

## 2012-04-14 ENCOUNTER — Other Ambulatory Visit: Payer: Self-pay | Admitting: Obstetrics & Gynecology

## 2012-04-14 ENCOUNTER — Ambulatory Visit (INDEPENDENT_AMBULATORY_CARE_PROVIDER_SITE_OTHER): Payer: Medicaid Other | Admitting: General Practice

## 2012-04-14 ENCOUNTER — Other Ambulatory Visit: Payer: Self-pay | Admitting: General Practice

## 2012-04-14 ENCOUNTER — Ambulatory Visit (INDEPENDENT_AMBULATORY_CARE_PROVIDER_SITE_OTHER): Payer: Medicaid Other

## 2012-04-14 ENCOUNTER — Encounter: Payer: Self-pay | Admitting: General Practice

## 2012-04-14 VITALS — BP 113/68 | HR 60 | Temp 96.7°F | Ht 64.0 in | Wt 158.0 lb

## 2012-04-14 DIAGNOSIS — R109 Unspecified abdominal pain: Secondary | ICD-10-CM

## 2012-04-14 DIAGNOSIS — R52 Pain, unspecified: Secondary | ICD-10-CM

## 2012-04-14 DIAGNOSIS — F411 Generalized anxiety disorder: Secondary | ICD-10-CM

## 2012-04-14 DIAGNOSIS — M5430 Sciatica, unspecified side: Secondary | ICD-10-CM

## 2012-04-14 DIAGNOSIS — M5431 Sciatica, right side: Secondary | ICD-10-CM

## 2012-04-14 DIAGNOSIS — R300 Dysuria: Secondary | ICD-10-CM

## 2012-04-14 LAB — POCT UA - MICROSCOPIC ONLY
Bacteria, U Microscopic: NEGATIVE
Casts, Ur, LPF, POC: NEGATIVE
Crystals, Ur, HPF, POC: NEGATIVE
Mucus, UA: NEGATIVE
WBC, Ur, HPF, POC: NEGATIVE
Yeast, UA: NEGATIVE

## 2012-04-14 MED ORDER — KETOROLAC TROMETHAMINE 30 MG/ML IJ SOLN: 30.0000 mg | Freq: Once | INTRAMUSCULAR | Status: DC

## 2012-04-14 MED ORDER — ALPRAZOLAM 1 MG PO TABS: 1.0000 mg | ORAL_TABLET | Freq: Two times a day (BID) | ORAL | Status: DC | PRN

## 2012-04-14 MED ORDER — KETOROLAC TROMETHAMINE 30 MG/ML IJ SOLN
30.0000 mg | Freq: Once | INTRAMUSCULAR | Status: AC
  Administered 2012-04-14: 30 mg via INTRAMUSCULAR

## 2012-04-14 NOTE — Patient Instructions (Addendum)
Sciatica Sciatica is pain, weakness, numbness, or tingling along the path of the sciatic nerve. The nerve starts in the lower back and runs down the back of each leg. The nerve controls the muscles in the lower leg and in the back of the knee, while also providing sensation to the back of the thigh, lower leg, and the sole of your foot. Sciatica is a symptom of another medical condition. For instance, nerve damage or certain conditions, such as a herniated disk or bone spur on the spine, pinch or put pressure on the sciatic nerve. This causes the pain, weakness, or other sensations normally associated with sciatica. Generally, sciatica only affects one side of the body. CAUSES   Herniated or slipped disc.  Degenerative disk disease.  A pain disorder involving the narrow muscle in the buttocks (piriformis syndrome).  Pelvic injury or fracture.  Pregnancy.  Tumor (rare). SYMPTOMS  Symptoms can vary from mild to very severe. The symptoms usually travel from the low back to the buttocks and down the back of the leg. Symptoms can include:  Mild tingling or dull aches in the lower back, leg, or hip.  Numbness in the back of the calf or sole of the foot.  Burning sensations in the lower back, leg, or hip.  Sharp pains in the lower back, leg, or hip.  Leg weakness.  Severe back pain inhibiting movement. These symptoms may get worse with coughing, sneezing, laughing, or prolonged sitting or standing. Also, being overweight may worsen symptoms. DIAGNOSIS  Your caregiver will perform a physical exam to look for common symptoms of sciatica. He or she may ask you to do certain movements or activities that would trigger sciatic nerve pain. Other tests may be performed to find the cause of the sciatica. These may include:  Blood tests.  X-rays.  Imaging tests, such as an MRI or CT scan. TREATMENT  Treatment is directed at the cause of the sciatic pain. Sometimes, treatment is not necessary  and the pain and discomfort goes away on its own. If treatment is needed, your caregiver may suggest:  Over-the-counter medicines to relieve pain.  Prescription medicines, such as anti-inflammatory medicine, muscle relaxants, or narcotics.  Applying heat or ice to the painful area.  Steroid injections to lessen pain, irritation, and inflammation around the nerve.  Reducing activity during periods of pain.  Exercising and stretching to strengthen your abdomen and improve flexibility of your spine. Your caregiver may suggest losing weight if the extra weight makes the back pain worse.  Physical therapy.  Surgery to eliminate what is pressing or pinching the nerve, such as a bone spur or part of a herniated disk. HOME CARE INSTRUCTIONS   Only take over-the-counter or prescription medicines for pain or discomfort as directed by your caregiver.  Apply ice to the affected area for 20 minutes, 3 4 times a day for the first 48 72 hours. Then try heat in the same way.  Exercise, stretch, or perform your usual activities if these do not aggravate your pain.  Attend physical therapy sessions as directed by your caregiver.  Keep all follow-up appointments as directed by your caregiver.  Do not wear high heels or shoes that do not provide proper support.  Check your mattress to see if it is too soft. A firm mattress may lessen your pain and discomfort. SEEK IMMEDIATE MEDICAL CARE IF:   You lose control of your bowel or bladder (incontinence).  You have increasing weakness in the lower back,   pelvis, buttocks, or legs.  You have redness or swelling of your back.  You have a burning sensation when you urinate.  You have pain that gets worse when you lie down or awakens you at night.  Your pain is worse than you have experienced in the past.  Your pain is lasting longer than 4 weeks.  You are suddenly losing weight without reason. MAKE SURE YOU:  Understand these  instructions.  Will watch your condition.  Will get help right away if you are not doing well or get worse. Document Released: 01/02/2001 Document Revised: 07/10/2011 Document Reviewed: 05/20/2011 [facility] Patient Information 2013 [place], [place]. Dysuria Dysuria is the medical term for pain with urination. There are many causes for dysuria, but urinary tract infection is the most common. If a urinalysis was performed it can show that there is a urinary tract infection. A urine culture confirms that you or your child is sick. You will need to follow up with a healthcare provider because:  If a urine culture was done you will need to know the culture results and treatment recommendations.  If the urine culture was positive, you or your child will need to be put on antibiotics or know if the antibiotics prescribed are the right antibiotics for your urinary tract infection.  If the urine culture is negative (no urinary tract infection), then other causes may need to be explored or antibiotics need to be stopped. Today laboratory work may have been done and there does not seem to be an infection. If cultures were done they will take at least 24 to 48 hours to be completed. Today x-rays may have been taken and they read as normal. No cause can be found for the problems. The x-rays may be re-read by a radiologist and you will be contacted if additional findings are made. You or your child may have been put on medications to help with this problem until you can see your primary caregiver. If the problems get better, see your primary caregiver if the problems return. If you were given antibiotics (medications which kill germs), take all of the mediations as directed for the full course of treatment.  If laboratory work was done, you need to find the results. Leave a telephone number where you can be reached. If this is not possible, make sure you find out how you are to get test results. HOME CARE  INSTRUCTIONS   Drink lots of fluids. For adults, drink eight, 8 ounce glasses of clear juice or water a day. For children, replace fluids as suggested by your caregiver.  Empty the bladder often. Avoid holding urine for long periods of time.  After a bowel movement, women should cleanse front to back, using each tissue only once.  Empty your bladder before and after sexual intercourse.  Take all the medicine given to you until it is gone. You may feel better in a few days, but TAKE ALL MEDICINE.  Avoid caffeine, tea, alcohol and carbonated beverages, because they tend to irritate the bladder.  In men, alcohol may irritate the prostate.  Only take over-the-counter or prescription medicines for pain, discomfort, or fever as directed by your caregiver.  If your caregiver has given you a follow-up appointment, it is very important to keep that appointment. Not keeping the appointment could result in a chronic or permanent injury, pain, and disability. If there is any problem keeping the appointment, you must call back to this facility for assistance. SEEK IMMEDIATE MEDICAL CARE  IF:   Back pain develops.  A fever develops.  There is nausea (feeling sick to your stomach) or vomiting (throwing up).  Problems are no better with medications or are getting worse. MAKE SURE YOU:   Understand these instructions.  Will watch your condition.  Will get help right away if you are not doing well or get worse. Document Released: 10/07/2003 Document Revised: 04/02/2011 Document Reviewed: 08/14/2007 [facility] Patient Information 2013 [place], [place].

## 2012-04-14 NOTE — Progress Notes (Signed)
  Subjective:    Patient ID: [name], female    DOB: June 23, 1960, 52 y.o.   MRN: [id number]  HPI Patient presents today with burning, stinging, sharp pain that radiates from lower back down right leg. Reports taking tylenol or applying aleve, tylenol and applying a topical cream without relief. Also having a burning in neck area. Patient reports having this similar pain before. Denies pain any similar pain in other parts of body.  Patient also reports pelvic area pain, which has been present for one month. Patient was given one dose of medication without relief. Patient was scheduled for annual pap, but missed appointment. Patient reports she is going today for rescheduling.   Patient reports feeling very anxious and that people are starring at her all the time. Reports increased nervousness since xanax decreased to 0.5mg  every 12-24 hours prn. Reports when taking 1mg  q 12-24 hours, prn the anxiety and nervousness was relieved. Denies thoughts of suicidal ideations.    Review of Systems  Constitutional: Negative for fever and chills.  HENT: Positive for neck pain.   Respiratory: Negative for chest tightness and shortness of breath.   Cardiovascular: Negative for chest pain and palpitations.  Gastrointestinal: Negative for diarrhea and abdominal distention.  Genitourinary: Positive for dysuria, flank pain, vaginal pain and pelvic pain. Negative for hematuria, vaginal discharge and difficulty urinating.       Pain with sexual intercourse, no bleeding  Neurological: Negative for dizziness and light-headedness.  Psychiatric/Behavioral: The patient is nervous/anxious.        Objective:   Physical Exam  Constitutional: She appears well-developed and well-nourished.  HENT:  Head: Normocephalic and atraumatic.  Neck:  Decreased range of motion due to neck pain  Cardiovascular: Normal rate, regular rhythm and normal heart sounds.   No murmur heard. Pulmonary/Chest: Effort normal and  breath sounds normal.  Abdominal: Soft. Bowel sounds are normal. She exhibits no distension.  Left pelvic pain upon palpation  Lymphadenopathy:    She has no cervical adenopathy.   Results for orders placed in visit on 04/14/12  POCT UA - MICROSCOPIC ONLY      Result Value Range   WBC, Ur, HPF, POC neg     RBC, urine, microscopic 5-10     Bacteria, U Microscopic neg     Mucus, UA neg     Epithelial cells, urine per micros occ     Crystals, Ur, HPF, POC neg     Casts, Ur, LPF, POC neg     Yeast, UA neg     [facility] reading (PRIMARY) by Ruthell Rummage, FNP-C, moderate stool noted.                                       Assessment & Plan:  Take medications as prescribed Increase fluid intake Reschedule appointment for pap  Raymon Mutton, FNP-C

## 2012-04-15 ENCOUNTER — Telehealth: Payer: Self-pay | Admitting: General Practice

## 2012-04-15 ENCOUNTER — Encounter: Payer: Self-pay | Admitting: Obstetrics & Gynecology

## 2012-04-15 ENCOUNTER — Ambulatory Visit (INDEPENDENT_AMBULATORY_CARE_PROVIDER_SITE_OTHER): Payer: Medicaid Other | Admitting: Obstetrics & Gynecology

## 2012-04-15 VITALS — BP 109/71 | HR 84 | Resp 16 | Ht 63.0 in | Wt 160.0 lb

## 2012-04-15 DIAGNOSIS — N952 Postmenopausal atrophic vaginitis: Secondary | ICD-10-CM | POA: Insufficient documentation

## 2012-04-15 DIAGNOSIS — N949 Unspecified condition associated with female genital organs and menstrual cycle: Secondary | ICD-10-CM

## 2012-04-15 DIAGNOSIS — R102 Pelvic and perineal pain unspecified side: Secondary | ICD-10-CM | POA: Insufficient documentation

## 2012-04-15 DIAGNOSIS — Z124 Encounter for screening for malignant neoplasm of cervix: Secondary | ICD-10-CM

## 2012-04-15 DIAGNOSIS — Z1151 Encounter for screening for human papillomavirus (HPV): Secondary | ICD-10-CM

## 2012-04-15 DIAGNOSIS — Z01419 Encounter for gynecological examination (general) (routine) without abnormal findings: Secondary | ICD-10-CM

## 2012-04-15 MED ORDER — ESTRADIOL-NORETHINDRONE ACET 0.05-0.14 MG/DAY TD PTTW: 1.0000 | MEDICATED_PATCH | TRANSDERMAL | Status: DC

## 2012-04-15 NOTE — Telephone Encounter (Signed)
Wants results of x ray taken yesterday.

## 2012-04-15 NOTE — Progress Notes (Signed)
Patient ID: [name], female   DOB: [birth year], 52 y.o.   MRN: [id number]  Chief Complaint  Patient presents with  . Gynecologic Exam    Pt has pain LLQ-dysuria-painful intercourse   HPI [name] is a 52 y.o. female.  Hot flashes for 1 year,  Vaginal burning, and LLQ pain for a few weeks.No obstetric history on file. G3P3, No LMP recorded. Patient has had an ablation.  HPI  Past Medical History  Diagnosis Date  . Anxiety   . Depression   . Hyperlipidemia     Past Surgical History  Procedure Laterality Date  . Tubal ligation    . Cataract extraction    . Tonsillectomy    . Cesarean section    . Bunionectomy    . Elbow surgery    . Rhytidectomy neck / cheek / chin    . Novasure ablation    . Colonoscopy w/ polypectomy    ablation 5 years ago  Family History  Problem Relation Age of Onset  . Brain cancer Mother   . Cancer - Other Father     Abdominal   . Colon cancer Sister   . Colon cancer Paternal Grandmother     Social History History  Substance Use Topics  . Smoking status: Current Every Day Smoker -- 1.00 packs/day    Types: Cigarettes    Start date: 01/23/1976  . Smokeless tobacco: Not on file  . Alcohol Use: No    Allergies  Allergen Reactions  . Codeine   . Meperidine Hcl     Current Outpatient Prescriptions  Medication Sig Dispense Refill  . ALPRAZolam (XANAX) 1 MG tablet Take 1 tablet (1 mg total) by mouth 2 (two) times daily as needed for anxiety.  60 tablet  2  . omeprazole (PRILOSEC) 40 MG capsule Take 40 mg by mouth daily.      . simvastatin (ZOCOR) 40 MG tablet Take 40 mg by mouth every evening.       No current facility-administered medications for this visit.    Review of Systems Review of Systems  Constitutional: Negative.   Gastrointestinal: Positive for abdominal pain (llq) and constipation. Negative for nausea, diarrhea, blood in stool and abdominal distention.  Genitourinary: Positive for vaginal pain ( burning, esp  with intercourse). Negative for vaginal bleeding, vaginal discharge and menstrual problem.    Blood pressure 109/71, pulse 84, resp. rate 16, height 5\' 3"  (1.6 m), weight 160 lb (72.576 kg).  Physical Exam Physical Exam  Constitutional: She is oriented to person, place, and time. She appears well-developed. No distress.  Pulmonary/Chest:  Breasts NT no masses  Abdominal: Soft. She exhibits no mass. There is tenderness (mild llq). There is no guarding.  Genitourinary: No vaginal discharge found.  Mild vaginal atrophy, cervix normal, pap done, uterus nl, no mass  Neurological: She is alert and oriented to person, place, and time.  Skin: Skin is warm and dry.  Psychiatric: She has a normal mood and affect. Her behavior is normal.    Data Reviewed   Assessment    LLQ pain, menopausal sx, vaginal atrophy      Plan    Requests hormone patch-Combipatch. Pelvic US, RTC to review    screening mammogram    Kristiane Morsch 04/15/2012, 1:50 PM

## 2012-04-15 NOTE — Patient Instructions (Signed)

## 2012-04-16 ENCOUNTER — Telehealth: Payer: Self-pay | Admitting: *Deleted

## 2012-04-16 ENCOUNTER — Other Ambulatory Visit: Payer: Self-pay | Admitting: General Practice

## 2012-04-16 DIAGNOSIS — M5431 Sciatica, right side: Secondary | ICD-10-CM

## 2012-04-16 MED ORDER — TRAMADOL HCL 50 MG PO TABS: 50.0000 mg | ORAL_TABLET | Freq: Three times a day (TID) | ORAL | Status: DC | PRN

## 2012-04-16 NOTE — Telephone Encounter (Signed)
Notified patient of normal results. Patient saw her gyn yesterday and they ordered an [place] for abd pain. Would like referral to neuro for her hip pain. Also wants to know if she can have some type of low dose pain med until she sees neuro.

## 2012-04-18 ENCOUNTER — Ambulatory Visit (HOSPITAL_COMMUNITY): Payer: Medicaid Other

## 2012-04-18 ENCOUNTER — Other Ambulatory Visit (HOSPITAL_COMMUNITY): Payer: Medicaid Other

## 2012-04-18 LAB — PAP IG (IMAGE GUIDED)

## 2012-04-18 LAB — HERPES SIMPLEX VIRUS(HSV) DNA BY PCR
HSV 1 DNA: NOT DETECTED
HSV 2 DNA: NOT DETECTED

## 2012-04-21 ENCOUNTER — Ambulatory Visit (HOSPITAL_COMMUNITY)
Admission: RE | Admit: 2012-04-21 | Discharge: 2012-04-21 | Disposition: A | Discharge status: Home / Self Care | Payer: Medicaid Other | Source: Ambulatory Visit | Attending: Obstetrics & Gynecology | Admitting: Obstetrics & Gynecology

## 2012-04-21 ENCOUNTER — Telehealth: Payer: Self-pay | Admitting: General Practice

## 2012-04-21 ENCOUNTER — Ambulatory Visit (HOSPITAL_COMMUNITY): Payer: Medicaid Other

## 2012-04-21 DIAGNOSIS — R102 Pelvic and perineal pain: Secondary | ICD-10-CM

## 2012-04-21 DIAGNOSIS — Z01419 Encounter for gynecological examination (general) (routine) without abnormal findings: Secondary | ICD-10-CM

## 2012-04-21 DIAGNOSIS — N949 Unspecified condition associated with female genital organs and menstrual cycle: Secondary | ICD-10-CM | POA: Insufficient documentation

## 2012-04-21 DIAGNOSIS — N952 Postmenopausal atrophic vaginitis: Secondary | ICD-10-CM

## 2012-04-21 DIAGNOSIS — IMO0002 Reserved for concepts with insufficient information to code with codable children: Secondary | ICD-10-CM | POA: Insufficient documentation

## 2012-04-21 DIAGNOSIS — Z1231 Encounter for screening mammogram for malignant neoplasm of breast: Secondary | ICD-10-CM | POA: Insufficient documentation

## 2012-04-21 NOTE — Telephone Encounter (Signed)
Debbi, Can you take care of this? Thank you

## 2012-04-21 NOTE — Telephone Encounter (Signed)
Referral was sent to [facility].  Pending appt.

## 2012-04-22 LAB — STD SCREENING PANEL/HIGH RISK
Candida species: NOT DETECTED
Chlamydia trachomatis, NAA: NOT DETECTED
Gardnerella vaginalis: DETECTED — AB
Mycoplasma genitalium, NAA: NOT DETECTED
Neisseria gonorrhoeae, NAA: NOT DETECTED
Trichomonas vaginalis: NOT DETECTED

## 2012-04-29 ENCOUNTER — Encounter: Payer: Self-pay | Admitting: *Deleted

## 2012-04-29 ENCOUNTER — Ambulatory Visit: Payer: Medicaid Other | Admitting: Obstetrics & Gynecology

## 2012-05-06 ENCOUNTER — Encounter: Payer: Self-pay | Admitting: Obstetrics & Gynecology

## 2012-05-06 ENCOUNTER — Ambulatory Visit (INDEPENDENT_AMBULATORY_CARE_PROVIDER_SITE_OTHER): Payer: Medicaid Other | Admitting: Obstetrics & Gynecology

## 2012-05-06 ENCOUNTER — Encounter: Payer: Self-pay | Admitting: Neurology

## 2012-05-06 ENCOUNTER — Ambulatory Visit (INDEPENDENT_AMBULATORY_CARE_PROVIDER_SITE_OTHER): Payer: Medicaid Other | Admitting: Neurology

## 2012-05-06 VITALS — BP 113/72 | HR 71 | Resp 16 | Ht 64.0 in | Wt 159.0 lb

## 2012-05-06 VITALS — BP 107/77 | HR 73 | Ht 64.0 in | Wt 159.0 lb

## 2012-05-06 DIAGNOSIS — F419 Anxiety disorder, unspecified: Secondary | ICD-10-CM | POA: Insufficient documentation

## 2012-05-06 DIAGNOSIS — M5430 Sciatica, unspecified side: Secondary | ICD-10-CM | POA: Insufficient documentation

## 2012-05-06 DIAGNOSIS — F32A Depression, unspecified: Secondary | ICD-10-CM | POA: Insufficient documentation

## 2012-05-06 DIAGNOSIS — E785 Hyperlipidemia, unspecified: Secondary | ICD-10-CM

## 2012-05-06 DIAGNOSIS — F411 Generalized anxiety disorder: Secondary | ICD-10-CM

## 2012-05-06 DIAGNOSIS — N949 Unspecified condition associated with female genital organs and menstrual cycle: Secondary | ICD-10-CM

## 2012-05-06 DIAGNOSIS — F329 Major depressive disorder, single episode, unspecified: Secondary | ICD-10-CM

## 2012-05-06 DIAGNOSIS — M25559 Pain in unspecified hip: Secondary | ICD-10-CM | POA: Insufficient documentation

## 2012-05-06 DIAGNOSIS — R102 Pelvic and perineal pain: Secondary | ICD-10-CM

## 2012-05-06 DIAGNOSIS — M79604 Pain in right leg: Secondary | ICD-10-CM | POA: Insufficient documentation

## 2012-05-06 DIAGNOSIS — M79609 Pain in unspecified limb: Secondary | ICD-10-CM

## 2012-05-06 DIAGNOSIS — M549 Dorsalgia, unspecified: Secondary | ICD-10-CM

## 2012-05-06 LAB — POCT URINALYSIS DIPSTICK
Bilirubin, UA: NEGATIVE
Glucose, UA: NEGATIVE
Ketones, UA: NEGATIVE
Leukocytes, UA: NEGATIVE
Nitrite, UA: NEGATIVE
Spec Grav, UA: 1.01
Urobilinogen, UA: 0.2
pH, UA: 6.5

## 2012-05-06 MED ORDER — MELOXICAM 7.5 MG PO TABS: 7.5000 mg | ORAL_TABLET | Freq: Two times a day (BID) | ORAL | Status: DC

## 2012-05-06 NOTE — Progress Notes (Signed)
[name] is a 52 years old right-handed Caucasian female, referred by her primary care physician Dr. Rudi Heap, for evaluation of low back pain, right hip pain  She had past medical history of depression anxiety, 5 weeks ago, without clear trigger event, she began to suffer severe right hip pain, the pain is so intense, she is to the point of throwing up, she also complains of right low back pain, radiating pain to the right hip, radiating pain to her right posterior thigh and, involving the right first and second toes she denies left leg similar involvement,  She denies weakness, she denies bowel and bladder incontinence, she has some gait difficulty because of the pain,  Review of Systems  Out of a complete 14 system review, the patient complains of only the following symptoms, and all other reviewed systems are negative.   Constitutional:   Fatigue Cardiovascular:  Palpation Ear/Nose/Throat:  ringing in ears Skin: Rash, birthmark, itching, moles Eyes: Blurry vision Respiratory: Shortness of breath snoring, Gastroitestinal: N/A    Hematology/Lymphatic:  N/A Endocrine:  Feeling hot, cold, increased thirst  Musculoskeletal: Joint pain, achy muscles  Allergy/Immunology: N/A Neurological: Headaches, numbness, weakness, slurred speech, dizziness, snoring, restless leg  Psychiatric:    Anxiety, decreased energy, change in appetite  PHYSICAL EXAMINATOINS:  Generalized: In no acute distress  Neck: Supple, no carotid bruits   Cardiac: Regular rate rhythm  Pulmonary: Clear to auscultation bilaterally  Musculoskeletal: No deformity  Neurological examination  Mentation: Alert oriented to time, place, history taking, and causual conversation  Cranial nerve II-XII: Pupils were equal round reactive to light extraocular movements were full, visual field were full on confrontational test. facial sensation and strength were normal. hearing was intact to finger rubbing bilaterally. Uvula  tongue midline.  head turning and shoulder shrug and were normal and symmetric.Tongue protrusion into cheek strength was normal.  Motor: normal tone, bulk and strength. Straight leg testing was positive on the right leg  Sensory: Intact to fine touch, pinprick, preserved vibratory sensation, and proprioception at toes.  Coordination: Normal finger to nose, heel-to-shin bilaterally there was no truncal ataxia  Gait: Rising up from seated position without assistance, normal stance, without trunk ataxia, moderate stride, good arm swing, smooth turning, able to perform tiptoe, and heel walking without difficulty.   Romberg signs: Negative,   Deep tendon reflexes: Brachioradialis 2/2, biceps 2/2, triceps 2/2, patellar 2/2, Achilles 2/2, plantar responses were flexor bilaterally.  Assessment and plan  52 years old right-handed Caucasian female, with five-weeks history of right-sided low back pain, radiating pain to right hip, right posterior neck, most consistent with a right L5 radiculopathy. Straight leg testing was positive.  1. MRI of lumbar 2. EMG nerve conduction study. 3. Mobic as needed

## 2012-05-06 NOTE — Patient Instructions (Signed)
Pelvic Pain, Female Female pelvic pain can be caused by many different things and start from a variety of places. Pelvic pain refers to pain that is located in the lower half of the abdomen and between your hips. The pain may occur over a short period of time (acute) or may be reoccurring (chronic). The cause of pelvic pain may be related to disorders affecting the female reproductive organs (gynecologic), but it may also be related to the bladder, kidney stones, an intestinal complication, or muscle or skeletal problems. Getting help right away for pelvic pain is important, especially if there has been severe, sharp, or a sudden onset of unusual pain. It is also important to get help right away because some types of pelvic pain can be life threatening.  CAUSES  Below are only some of the causes of pelvic pain. The causes of pelvic pain can be in one of several categories.   Gynecologic.  Pelvic inflammatory disease.  Sexually transmitted infection.  Ovarian cyst or a twisted ovarian ligament (ovarian torsion).  Uterine lining that grows outside the uterus (endometriosis).  Fibroids, cysts, or tumors.  Ovulation.  Pregnancy.  Pregnancy that occurs outside the uterus (ectopic pregnancy).  Miscarriage.  Labor.  Abruption of the placenta or ruptured uterus.  Infection.  Uterine infection (endometritis).  Bladder infection.  Diverticulitis.  Miscarriage related to a uterine infection (septic abortion).  Bladder.  Inflammation of the bladder (cystitis).  Kidney stone(s).  Gastrointenstinal.  Constipation.  Diverticulitis.  Neurologic.  Trauma.  Feeling pelvic pain because of mental or emotional causes (psychosomatic).  Cancers of the bowel or pelvis. EVALUATION  Your caregiver will want to take a careful history of your concerns. This includes recent changes in your health, a careful gynecologic history of your periods (menses), and a sexual history. Obtaining  your family history and medical history is also important. Your caregiver may suggest a pelvic exam. A pelvic exam will help identify the location and severity of the pain. It also helps in the evaluation of which organ system may be involved. In order to identify the cause of the pelvic pain and be properly treated, your caregiver may order tests. These tests may include:   A pregnancy test.  Pelvic ultrasonography.  An X-ray exam of the abdomen.  A urinalysis or evaluation of vaginal discharge.  Blood tests. HOME CARE INSTRUCTIONS   Only take over-the-counter or prescription medicines for pain, discomfort, or fever as directed by your caregiver.   Rest as directed by your caregiver.   Eat a balanced diet.   Drink enough fluids to make your urine clear or pale yellow, or as directed.   Avoid sexual intercourse if it causes pain.   Apply warm or cold compresses to the lower abdomen depending on which one helps the pain.   Avoid stressful situations.   Keep a journal of your pelvic pain. Write down when it started, where the pain is located, and if there are things that seem to be associated with the pain, such as food or your menstrual cycle.  Follow up with your caregiver as directed.  SEEK MEDICAL CARE IF:  Your medicine does not help your pain.  You have abnormal vaginal discharge. SEEK IMMEDIATE MEDICAL CARE IF:   You have heavy bleeding from the vagina.   Your pelvic pain increases.   You feel lightheaded or faint.   You have chills.   You have pain with urination or blood in your urine.   You have uncontrolled   diarrhea or vomiting.   You have a fever or persistent symptoms for more than 3 days.  You have a fever and your symptoms suddenly get worse.   You are being physically or sexually abused.  MAKE SURE YOU:  Understand these instructions.  Will watch your condition.  Will get help if you are not doing well or get worse. Document  Released: 12/06/2003 Document Revised: 07/10/2011 Document Reviewed: 04/30/2011 [employer] Patient Information 2013 [employer].  

## 2012-05-06 NOTE — Progress Notes (Signed)
  Subjective:    Patient ID: [name], female    DOB: Feb 22, 1960, 52 y.o.   MRN: [id number]  HPI LLQ pain is better but still some burning and she wants her urine checked.     Review of Systems  Constitutional: Negative for fever.  Genitourinary: Positive for urgency. Negative for hematuria and vaginal discharge.   Some dysuria    Objective:   Physical Exam  Constitutional: She appears well-nourished. No distress.  Psychiatric: She has a normal mood and affect. Her behavior is normal.         RADIOLOGY REPORT*  Clinical Data: Pelvic pain with painful intercourse. Prior  ablation. Tubal ligation.  TRANSABDOMINAL AND TRANSVAGINAL ULTRASOUND OF PELVIS  Technique: Both transabdominal and transvaginal ultrasound  examinations of the pelvis were performed. Transabdominal technique  was performed for global imaging of the pelvis including uterus,  ovaries, adnexal regions, and pelvic cul-de-sac.  It was necessary to proceed with endovaginal exam following the  transabdominal exam to visualize the uterus, ovaries, and adnexa .  Comparison: CT 04/22/2007; prior report not available  Findings:  Uterus: 6.9 x 3.6 x 4.4 cm. Normal in morphology.  Endometrium: Partially obscured by overlying bowel gas. Normal in  its visualized portions, 4 mm.  Right ovary: 2.8 x 1.1 x 1.5 cm. Normal in morphology.  Left ovary: 3.2 x 1.1 x 1.6 cm. Only seen transabdominally. Normal  in morphology.  Other findings: Trace free pelvic fluid is likely physiologic.  IMPRESSION:  No acute process or explanation for pelvic pain.  Original Report Authenticated By: Jeronimo Greaves, M.D.   Assessment & Plan:  Nl [place], pelvic sx may be urinary. UA today. Consider urology f/u if sx persist   Adam Phenix, MD 05/06/2012

## 2012-05-06 NOTE — Addendum Note (Signed)
Addended by: Arne Cleveland on: 05/06/2012 03:33 PM   Modules accepted: Orders

## 2012-05-08 ENCOUNTER — Encounter: Payer: Medicaid Other | Admitting: Neurology

## 2012-05-08 LAB — CULTURE, URINE COMPREHENSIVE
Colony Count: NO GROWTH
Organism ID, Bacteria: NO GROWTH

## 2012-05-15 ENCOUNTER — Ambulatory Visit (INDEPENDENT_AMBULATORY_CARE_PROVIDER_SITE_OTHER): Payer: Medicaid Other | Admitting: Neurology

## 2012-05-15 ENCOUNTER — Encounter (INDEPENDENT_AMBULATORY_CARE_PROVIDER_SITE_OTHER): Payer: Medicaid Other

## 2012-05-15 DIAGNOSIS — F32A Depression, unspecified: Secondary | ICD-10-CM

## 2012-05-15 DIAGNOSIS — R209 Unspecified disturbances of skin sensation: Secondary | ICD-10-CM

## 2012-05-15 DIAGNOSIS — F329 Major depressive disorder, single episode, unspecified: Secondary | ICD-10-CM

## 2012-05-15 DIAGNOSIS — M79604 Pain in right leg: Secondary | ICD-10-CM

## 2012-05-15 DIAGNOSIS — E785 Hyperlipidemia, unspecified: Secondary | ICD-10-CM

## 2012-05-15 DIAGNOSIS — F419 Anxiety disorder, unspecified: Secondary | ICD-10-CM

## 2012-05-15 DIAGNOSIS — Z0289 Encounter for other administrative examinations: Secondary | ICD-10-CM

## 2012-05-15 DIAGNOSIS — M549 Dorsalgia, unspecified: Secondary | ICD-10-CM

## 2012-05-15 DIAGNOSIS — M79609 Pain in unspecified limb: Secondary | ICD-10-CM

## 2012-05-15 MED ORDER — HYDROCODONE-IBUPROFEN 5-200 MG PO TABS: 1.0000 | ORAL_TABLET | Freq: Three times a day (TID) | ORAL | Status: DC | PRN

## 2012-05-15 NOTE — Procedures (Signed)
History: 52 year old female, without trigger presenting with acute right-sided low back pain, radiating pain into right buttock, shooting along the right lateral thigh, lateral, top of her right foot, involving first 2 toes,  Examination: Bilateral lower extremity motor strength is normal, deep tendon reflexes were normal and symmetric, including bilateral ankle reflexes,  Nerve conduction study:  Bilateral peroneal sensory responses were normal. Bilateral peroneal EDB motor responses were normal. Bilateral tibial motor responses were normal. Bilateral tibial H. reflexes were present and symmetric.  Electromyography:  Selected needle examination was performed at right lower extremity muscles, and right lumbosacral paraspinal muscles.  Needle examination of right tibialis anterior, tibialis posterior, medial gastrocnemius, peroneal longus, vastus lateralis, flexor digitorum longus, biceps femoris short head,gluteus medius  was normal.  There was no spontaneous activity at right lumbosacral paraspinal muscles, right L3,  L4, 5, S1.   IN CONCLUSION: This is a normal study. There is no electrodiagnostic evidence of large fiber peripheral neuropathy, right lower extremity neuropathy, or right lumbosacral radiculopathy. But the symptoms suggest acute right L5 radiculopathy, MRI lumbar is pending.

## 2012-05-19 ENCOUNTER — Telehealth: Payer: Self-pay

## 2012-05-19 NOTE — Telephone Encounter (Signed)
Ok or come in for xananx sample

## 2012-05-19 NOTE — Telephone Encounter (Signed)
Patient called front desk requesting that a Rx for [facility].  Okay to prescribe?  Please Advise.  Thank you.

## 2012-05-19 NOTE — Telephone Encounter (Signed)
Message copied by Lucille Passy C on Mon May 19, 2012 10:30 AM ------      Message from: Philipp Ovens R      Created: Mon May 19, 2012 10:25 AM      Contact: patient       Pt needs Xanax for [facility] tomorrow. [facility] [place] ------

## 2012-05-20 DIAGNOSIS — M545 Low back pain, unspecified: Secondary | ICD-10-CM

## 2012-05-23 ENCOUNTER — Telehealth: Payer: Self-pay | Admitting: Neurology

## 2012-05-23 DIAGNOSIS — M5416 Radiculopathy, lumbar region: Secondary | ICD-10-CM

## 2012-05-23 NOTE — Telephone Encounter (Signed)
I have called her, MRI lumbar showed L5-S1: disc bulging, severe right facet arthropathy, with severe right foraminal stenosis; potential impingement upon the exiting right L5 nerve root   Refer to pt, and neurosurgeon.

## 2012-06-04 ENCOUNTER — Telehealth: Payer: Self-pay | Admitting: Neurology

## 2012-06-04 MED ORDER — GABAPENTIN 300 MG PO CAPS: 600.0000 mg | ORAL_CAPSULE | Freq: Three times a day (TID) | ORAL | Status: DC

## 2012-06-04 NOTE — Telephone Encounter (Signed)
Called patient back about (Mobic). Patient states she is taking twice daily. And Mobic is not helping. Patient went to [place] today neurosurgical today and they told her to call Dr.Yan about Mobic.

## 2012-06-04 NOTE — Telephone Encounter (Signed)
I have called her, she still complains of right low back pain, right hip pain, buttock pain, burning, takes her breath away, mobic prn does not help.  She was seen by neurosurgeon today, epidural injection is planned first, if not effective then considering surgery.  MRI lumbar showed   1. At L5-S1: disc bulging, severe right facet arthropathy, with severe right foraminal stenosis; potential impingement upon the exiting right L5 nerve root. 2. Note enumeration scheme. Lumbarization of S1 with transitional features. There is a S1-2 intervertebral disc on sagittal views. Consider correlation with plain film xray if surgical intervention is planned.  Her symptoms is suggestive of right L5 radiculopathy,   I called in gabapentin 300mg  ii tid.

## 2012-06-04 NOTE — Telephone Encounter (Signed)
Called patient back and to see about RX. (Mobic) PRN. Patient states

## 2012-06-05 ENCOUNTER — Ambulatory Visit: Payer: Medicaid Other

## 2012-06-19 ENCOUNTER — Ambulatory Visit: Payer: Self-pay | Admitting: Neurology

## 2012-06-26 NOTE — Telephone Encounter (Signed)
Informational only 

## 2012-06-30 ENCOUNTER — Telehealth: Payer: Self-pay

## 2012-06-30 NOTE — Telephone Encounter (Signed)
Message copied by Faythe Casa on Mon Jun 30, 2012 10:49 AM ------      Message from: Adam Phenix      Created: Sat Jun 28, 2012  3:10 PM       LSIL pap, needs colposcopy ------

## 2012-07-02 ENCOUNTER — Telehealth: Payer: Self-pay | Admitting: *Deleted

## 2012-07-02 NOTE — Telephone Encounter (Signed)
Spoke to pt adv abnormal PAP - need Colpo - made appt for [place] office since no Colpo machine in [place] - she will be in for Colpo on 07/16/12 at 9:30am

## 2012-07-07 ENCOUNTER — Ambulatory Visit: Payer: Self-pay | Admitting: Neurology

## 2012-07-16 ENCOUNTER — Encounter: Payer: Medicaid Other | Admitting: Obstetrics & Gynecology

## 2012-07-23 ENCOUNTER — Encounter: Payer: Medicaid Other | Admitting: Obstetrics & Gynecology

## 2012-07-24 ENCOUNTER — Telehealth: Payer: Self-pay | Admitting: *Deleted

## 2012-07-24 NOTE — Telephone Encounter (Signed)
Called Pt she adv her sister is in bad health and has had to take care of her but has not had time to schedule - I explained importance of making appt soon - Pt scheduled appt for August 06, 2012 - she understands she needs to have this done and will make back up plans if her sister needs assistance during this time.

## 2012-08-05 ENCOUNTER — Telehealth: Payer: Self-pay

## 2012-08-05 NOTE — Telephone Encounter (Signed)
Pt. Aware of appointment Aug. 6 at 1:15p with Dr. Marice Potter for colpo at the [facility] clinic in [place].

## 2012-08-05 NOTE — Telephone Encounter (Signed)
Pt. Called to cancel colpo appointment due to caring for her sister.Pt. Would like to be seen at the [facility] office instead of [place] due to its closer for her. Patient goes on vacation and will not be back until august 1st. Once appointment is made for [place] office will let her know.

## 2012-08-06 ENCOUNTER — Encounter: Payer: Medicaid Other | Admitting: Obstetrics & Gynecology

## 2012-08-08 ENCOUNTER — Other Ambulatory Visit: Payer: Self-pay | Admitting: Family Medicine

## 2012-08-08 DIAGNOSIS — Z760 Encounter for issue of repeat prescription: Secondary | ICD-10-CM

## 2012-08-08 MED ORDER — ALPRAZOLAM 1 MG PO TABS: 1.0000 mg | ORAL_TABLET | Freq: Two times a day (BID) | ORAL | Status: DC | PRN

## 2012-08-19 ENCOUNTER — Ambulatory Visit (INDEPENDENT_AMBULATORY_CARE_PROVIDER_SITE_OTHER): Payer: Medicaid Other | Admitting: Family Medicine

## 2012-08-19 VITALS — BP 124/64 | HR 72 | Temp 97.1°F | Ht 63.0 in | Wt 160.0 lb

## 2012-08-19 DIAGNOSIS — F411 Generalized anxiety disorder: Secondary | ICD-10-CM

## 2012-08-19 DIAGNOSIS — Z760 Encounter for issue of repeat prescription: Secondary | ICD-10-CM

## 2012-08-19 DIAGNOSIS — B852 Pediculosis, unspecified: Secondary | ICD-10-CM

## 2012-08-19 MED ORDER — ALPRAZOLAM 1 MG PO TABS: 1.0000 mg | ORAL_TABLET | Freq: Two times a day (BID) | ORAL | Status: DC | PRN

## 2012-08-19 MED ORDER — PERMETHRIN 1 % EX LOTN: TOPICAL_LOTION | Freq: Once | CUTANEOUS | Status: DC

## 2012-08-19 NOTE — Patient Instructions (Signed)

## 2012-08-19 NOTE — Progress Notes (Signed)
  Subjective:    Patient ID: [name], female    DOB: 08/01/1960, 52 y.o.   MRN: [id number]  HPI This 52 y.o. female presents for evaluation of lice exposure.  She states that Her boyfriends family has come from [place] and there are 9 of them and each Of them have lice.  She has not been experiencing any itching or noticed nits .   Review of Systems C/o anxiety No chest pain, SOB, HA, dizziness, vision change, N/V, diarrhea, constipation, dysuria, urinary urgency or frequency, myalgias, arthralgias or rash.     Objective:   Physical Exam Vital signs noted  Well developed well nourished female.  HEENT - Head atraumatic Normocephalic, no                 Eyes - PERRLA, Conjuctiva - clear Sclera- Clear EOMI                Ears - EAC's Wnl TM's Wnl Gross Hearing WNL                Nose - Nares patent                 Throat - oropharanx wnl Respiratory - Lungs CTA bilateral Cardiac - RRR S1 and S2 without murmur GI - Abdomen soft Nontender and bowel sounds active x 4 Extremities - No edema. Neuro - Grossly intact.       Assessment & Plan:  Medicine refill - Plan: ALPRAZolam (XANAX) 1 MG tablet, One to two po bid prn anxiety for next few days And then she needs to wean herself back down to where she is only taking one half of the xanax and then  Even weaning down to using xanax for anxiety prn.  Discussed she also would do better if she were to start A long acting SSRI like serrtraline and she wants to wait for now. Discussed if not getting any better then follow up.  Lice - Plan: permethrin (PERMETHRIN LICE TREATMENT) 1 % lotion and apply from head to toe for 10 minutes then Rinse of.  Anxiety state, unspecified - Plan: ALPRAZolam (XANAX) 1 MG tablet as above then titrate down.

## 2012-08-27 ENCOUNTER — Other Ambulatory Visit (HOSPITAL_COMMUNITY)
Admission: RE | Admit: 2012-08-27 | Discharge: 2012-08-27 | Disposition: A | Discharge status: Home / Self Care | Payer: Medicaid Other | Source: Ambulatory Visit | Attending: Obstetrics & Gynecology | Admitting: Obstetrics & Gynecology

## 2012-08-27 ENCOUNTER — Encounter: Payer: Self-pay | Admitting: Obstetrics & Gynecology

## 2012-08-27 ENCOUNTER — Ambulatory Visit (INDEPENDENT_AMBULATORY_CARE_PROVIDER_SITE_OTHER): Payer: Medicaid Other | Admitting: Obstetrics & Gynecology

## 2012-08-27 VITALS — BP 108/76 | HR 78 | Temp 97.5°F | Ht 62.0 in | Wt 155.3 lb

## 2012-08-27 DIAGNOSIS — F17200 Nicotine dependence, unspecified, uncomplicated: Secondary | ICD-10-CM

## 2012-08-27 DIAGNOSIS — R87612 Low grade squamous intraepithelial lesion on cytologic smear of cervix (LGSIL): Secondary | ICD-10-CM | POA: Insufficient documentation

## 2012-08-27 DIAGNOSIS — R319 Hematuria, unspecified: Secondary | ICD-10-CM

## 2012-08-27 LAB — POCT URINALYSIS DIP (DEVICE)
Bilirubin Urine: NEGATIVE
Glucose, UA: NEGATIVE mg/dL
Ketones, ur: NEGATIVE mg/dL
Leukocytes, UA: NEGATIVE
Nitrite: NEGATIVE
Protein, ur: NEGATIVE mg/dL
Specific Gravity, Urine: 1.01 (ref 1.005–1.030)
Urobilinogen, UA: 1 mg/dL (ref 0.0–1.0)
pH: 6 (ref 5.0–8.0)

## 2012-08-27 LAB — POCT PREGNANCY, URINE: Preg Test, Ur: NEGATIVE

## 2012-08-27 NOTE — Progress Notes (Signed)
  Subjective:    Patient ID: [name], female    DOB: 1960-11-27, 52 y.o.   MRN: [id number]  HPI  52 y o SW smoker who had a LGSIL pap last month at the [place] office. She is here today for a colposcopy.    Review of Systems     Objective:   Physical Exam  UPT negative, consent signed, time out done Cervix prepped with acetic acid. Transformation zone seen in its entirety. Colpo adequate with the use of the endocervical speculum. Entirely normal portio. ECC obtained. She tolerated the procedure well.       Assessment & Plan:  LGSIL with normal colpo. If ECC negative, repeat pap in 6 months. I have encouraged her to decrease or stop smoking due to its risks including cervical cancer.

## 2012-08-27 NOTE — Addendum Note (Signed)
Addended by: Allie Bossier on: 08/27/2012 01:50 PM   Modules accepted: Orders

## 2012-09-02 ENCOUNTER — Encounter: Payer: Self-pay | Admitting: *Deleted

## 2012-09-09 ENCOUNTER — Telehealth: Payer: Self-pay | Admitting: *Deleted

## 2012-09-09 NOTE — Telephone Encounter (Signed)
Pt left message requesting test results.  I returned the call and left message that her test results were normal. She may call back if she has additional questions.

## 2012-09-12 ENCOUNTER — Ambulatory Visit: Payer: Self-pay | Admitting: Neurology

## 2012-10-13 ENCOUNTER — Ambulatory Visit (INDEPENDENT_AMBULATORY_CARE_PROVIDER_SITE_OTHER): Payer: Medicaid Other | Admitting: Family Medicine

## 2012-10-13 VITALS — BP 94/65 | HR 61 | Temp 97.6°F | Ht 63.0 in | Wt 150.0 lb

## 2012-10-13 DIAGNOSIS — J029 Acute pharyngitis, unspecified: Secondary | ICD-10-CM

## 2012-10-13 DIAGNOSIS — M5430 Sciatica, unspecified side: Secondary | ICD-10-CM

## 2012-10-13 DIAGNOSIS — M5432 Sciatica, left side: Secondary | ICD-10-CM

## 2012-10-13 DIAGNOSIS — F17200 Nicotine dependence, unspecified, uncomplicated: Secondary | ICD-10-CM

## 2012-10-13 DIAGNOSIS — Z720 Tobacco use: Secondary | ICD-10-CM

## 2012-10-13 DIAGNOSIS — B009 Herpesviral infection, unspecified: Secondary | ICD-10-CM | POA: Insufficient documentation

## 2012-10-13 LAB — POCT RAPID STREP A (OFFICE): Rapid Strep A Screen: NEGATIVE

## 2012-10-13 MED ORDER — ACYCLOVIR 800 MG PO TABS: 800.0000 mg | ORAL_TABLET | Freq: Every day | ORAL | Status: DC

## 2012-10-13 MED ORDER — MUPIROCIN 2 % EX OINT: TOPICAL_OINTMENT | Freq: Three times a day (TID) | CUTANEOUS | Status: DC

## 2012-10-13 NOTE — Progress Notes (Signed)
Patient ID: [name], female   DOB: 1960-07-15, 52 y.o.   MRN: [id number] SUBJECTIVE: CC: Chief Complaint  Patient presents with  . Sore Throat  . Headache    HPI: Son was sick first. Has a history of fever blisters on her lips. Has sores coming up in the nose.  Sorethroat.fever, chills. With headache. Burning in the back of the neck. No stiff neck. Mouth is hot. Feels like the flu. Tongue feels like it is coated.  Sciatica flared up   Past Medical History  Diagnosis Date  . Anxiety   . Depression   . Hyperlipidemia   . Sciatica   . Back pain   . Right leg pain   . Hip pain    Past Surgical History  Procedure Laterality Date  . Tubal ligation    . Cataract extraction    . Tonsillectomy    . Cesarean section    . Bunionectomy    . Elbow surgery    . Rhytidectomy neck / cheek / chin    . Novasure ablation    . Colonoscopy w/ polypectomy     History   Social History  . Marital Status: Divorced    Spouse Name: N/A    Number of Children: N/A  . Years of Education: N/A   Occupational History  . Not on file.   Social History Main Topics  . Smoking status: Current Every Day Smoker -- 1.00 packs/day    Types: Cigarettes    Start date: 01/23/1976  . Smokeless tobacco: Not on file  . Alcohol Use: No  . Drug Use: No  . Sexual Activity: Yes   Other Topics Concern  . 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    Family History  Problem Relation Age of Onset  . Brain cancer Mother   . Cancer - Other Father     Abdominal   . Colon cancer Sister   . Colon cancer Paternal Grandmother   . Colon cancer Sister    Current Outpatient Prescriptions on File Prior to Visit  Medication Sig Dispense Refill  . ALPRAZolam (XANAX) 1 MG tablet Take 1 tablet (1 mg total) by mouth 2 (two) times daily as needed for anxiety.  60 tablet  2  . hydrocodone-ibuprofen (VICOPROFEN) 5-200 MG per  tablet Take 1 tablet by mouth every 8 (eight) hours as needed for pain.  60 tablet  3  . omeprazole (PRILOSEC) 40 MG capsule Take 40 mg by mouth daily.      . simvastatin (ZOCOR) 40 MG tablet Take 40 mg by mouth every evening.       No current facility-administered medications on file prior to visit.   Allergies  Allergen Reactions  . Codeine Itching  . Meperidine Hcl     There is no immunization history on file for this patient. Prior to Admission medications   Medication Sig Start Date End Date Taking? Authorizing Provider  ALPRAZolam Prudy Feeler) 1 MG tablet Take 1 tablet (1 mg total) by mouth 2 (two) times daily as needed for anxiety. 08/19/12  Yes Deatra Canter, FNP  hydrocodone-ibuprofen (VICOPROFEN) 5-200 MG per tablet Take 1 tablet by mouth every 8 (eight) hours as needed for pain. 05/15/12  Yes Levert Feinstein, MD  omeprazole (PRILOSEC) 40 MG capsule Take 40 mg by mouth daily.   Yes Historical Provider, MD  simvastatin (  ZOCOR) 40 MG tablet Take 40 mg by mouth every evening.   Yes Historical Provider, MD    ROS: As above in the HPI. All other systems are stable or negative.  OBJECTIVE: APPEARANCE:  Patient in no acute distress.The patient appeared well nourished and normally developed. Acyanotic. Waist: VITAL SIGNS:BP 94/65  Pulse 61  Temp(Src) 97.6 F (36.4 C) (Oral)  Ht 5\' 3"  (1.6 m)  Wt 150 lb (68.04 kg)  BMI 26.58 kg/m2 WF congested nasally  SKIN: warm and  Dry without overt rashes, tattoos and scars  HEAD and Neck: without JVD, Head and scalp: normal Eyes:No scleral icterus. Fundi normal, eye movements normal. Ears: Auricle normal, canal normal, Tympanic membranes normal, insufflation normal. Nose: left nostril medial wall, has  Several small blisters. Bilateral rhinorrhea Throat: hyperemic Neck & thyroid: normal  CHEST & LUNGS: Chest wall: normal Lungs: Clear  [facility]S: Reveals the PMI to be normally located. Regular rhythm, First and Second Heart sounds are  normal,  absence of murmurs, rubs or gallops. Peripheral vasculature: Radial pulses: normal Dorsal pedis pulses: normal Posterior pulses: normal  ABDOMEN:  Appearance: normal Benign, no organomegaly, no masses, no Abdominal Aortic enlargement. No Guarding , no rebound. No Bruits. Bowel sounds: normal  RECTAL: N/A GU: N/A  EXTREMETIES: nonedematous. Left sciatic notch trigger.  NEUROLOGIC: oriented to time,place and person; nonfocal. Strength is normal Sensory is normal Reflexes are normal Cranial Nerves are normal.  ASSESSMENT: Acute pharyngitis - most likely viral - Plan: POCT rapid strep A  HSV-1 infection - Plan: acyclovir (ZOVIRAX) 800 MG tablet, mupirocin ointment (BACTROBAN) 2 %  Tobacco user  Sciatica, left  Discussed with patient the potential risks if this is a herpetic eruption in the left nostril. Will aggressively treat this. She has had minor HSV 1 eruption on the lips with URIs.  PLAN:  Smoking cessation counseling for 7 minutes and Rx nicotine patches.Rx photo in media section Orders Placed This Encounter  Procedures  . POCT rapid strep A    Results for orders placed in visit on 10/13/12  POCT RAPID STREP A (OFFICE)      Result Value Range   Rapid Strep A Screen Negative  Negative    Meds ordered this encounter  Medications  . acyclovir (ZOVIRAX) 800 MG tablet    Sig: Take 1 tablet (800 mg total) by mouth 5 (five) times daily.    Dispense:  35 tablet    Refill:  0  . mupirocin ointment (BACTROBAN) 2 %    Sig: Apply topically 3 (three) times daily.    Dispense:  22 g    Refill:  0   Hand washing. contagipousness discussed. Apply the bactroban into the nostril as directed.  We need to deal with the priority problems today.  Return in about 3 days (around 10/16/2012) for Recheck medical problems.  Will deal with the sciatica in 3 days.  Zyiah Withington P. Modesto Charon, M.D.

## 2012-10-14 ENCOUNTER — Emergency Department (HOSPITAL_COMMUNITY)
Admission: EM | Admit: 2012-10-14 | Discharge: 2012-10-15 | Disposition: A | Discharge status: Home / Self Care | Payer: Medicaid Other | Attending: Emergency Medicine | Admitting: Emergency Medicine

## 2012-10-14 ENCOUNTER — Encounter (HOSPITAL_COMMUNITY): Payer: Self-pay

## 2012-10-14 DIAGNOSIS — F329 Major depressive disorder, single episode, unspecified: Secondary | ICD-10-CM | POA: Insufficient documentation

## 2012-10-14 DIAGNOSIS — Z8639 Personal history of other endocrine, nutritional and metabolic disease: Secondary | ICD-10-CM | POA: Insufficient documentation

## 2012-10-14 DIAGNOSIS — G8929 Other chronic pain: Secondary | ICD-10-CM | POA: Insufficient documentation

## 2012-10-14 DIAGNOSIS — F411 Generalized anxiety disorder: Secondary | ICD-10-CM | POA: Insufficient documentation

## 2012-10-14 DIAGNOSIS — F3289 Other specified depressive episodes: Secondary | ICD-10-CM | POA: Insufficient documentation

## 2012-10-14 DIAGNOSIS — Z862 Personal history of diseases of the blood and blood-forming organs and certain disorders involving the immune mechanism: Secondary | ICD-10-CM | POA: Insufficient documentation

## 2012-10-14 DIAGNOSIS — M549 Dorsalgia, unspecified: Secondary | ICD-10-CM | POA: Insufficient documentation

## 2012-10-14 DIAGNOSIS — Z8619 Personal history of other infectious and parasitic diseases: Secondary | ICD-10-CM | POA: Insufficient documentation

## 2012-10-14 DIAGNOSIS — Z79899 Other long term (current) drug therapy: Secondary | ICD-10-CM | POA: Insufficient documentation

## 2012-10-14 DIAGNOSIS — Z7982 Long term (current) use of aspirin: Secondary | ICD-10-CM | POA: Insufficient documentation

## 2012-10-14 DIAGNOSIS — F17200 Nicotine dependence, unspecified, uncomplicated: Secondary | ICD-10-CM | POA: Insufficient documentation

## 2012-10-14 DIAGNOSIS — J069 Acute upper respiratory infection, unspecified: Secondary | ICD-10-CM | POA: Insufficient documentation

## 2012-10-14 LAB — URINALYSIS, ROUTINE W REFLEX MICROSCOPIC
Bilirubin Urine: NEGATIVE
Glucose, UA: NEGATIVE mg/dL
Ketones, ur: NEGATIVE mg/dL
Leukocytes, UA: NEGATIVE
Nitrite: NEGATIVE
Protein, ur: NEGATIVE mg/dL
Specific Gravity, Urine: 1.004 — ABNORMAL LOW (ref 1.005–1.030)
Urobilinogen, UA: 0.2 mg/dL (ref 0.0–1.0)
pH: 6 (ref 5.0–8.0)

## 2012-10-14 LAB — URINE MICROSCOPIC-ADD ON

## 2012-10-14 MED ORDER — KETOROLAC TROMETHAMINE 60 MG/2ML IM SOLN
60.0000 mg | Freq: Once | INTRAMUSCULAR | Status: AC
  Administered 2012-10-14: 60 mg via INTRAMUSCULAR
  Filled 2012-10-14: qty 2

## 2012-10-14 MED ORDER — PREDNISONE 20 MG PO TABS: ORAL_TABLET | ORAL | Status: DC

## 2012-10-14 MED ORDER — DIAZEPAM 5 MG PO TABS: 5.0000 mg | ORAL_TABLET | Freq: Four times a day (QID) | ORAL | Status: DC | PRN

## 2012-10-14 NOTE — ED Notes (Addendum)
Pt reports mid back pain radiating to her lower back started yesterday, pt reports she woke to day with the pain radiating to her bilateral flank area, sore throat, blisters in her nose, and a headache x3 days. Pt seen at her PCP yesterday and was given Acyclovir for the blisters in her nose. Pt has a hx of chronic back pain but was unable to see Dr. Jordan Likes today to get her Norco 5/325 mg refilled. Pt is also out of her Xanax 1 mg

## 2012-10-14 NOTE — ED Provider Notes (Signed)
CSN: [id number]     Arrival date & time 10/14/12  1929 History   First MD Initiated Contact with Patient 10/14/12 2255     Chief Complaint  Patient presents with  . Back Pain  . Sore Throat   (Consider location/radiation/quality/duration/timing/severity/associated sxs/prior Treatment) HPI Comments: Patient presents to the ER for evaluation of multiple complaints. Patient reports that she has had a "virus" for several days. She saw her doctor for this. She has blisters in her nose and was given acyclovir for this. Patient reports sinus congestion sore throat, and with these symptoms. She has had slight cough, no shortness of breath.  He came to the ER today because of increasing back pain. Patient reports that the pain is from the shoulder blades all the way down to the hip area on both sides. She reports a previous history of sciatica and chronic back problems. Patient indicates that she had an injection in her spine a month ago but it did not help. Patient reports the pain is now severe. She has not had any recent injury. No weakness, numbness in the lower extremities and no change in bowel or bladder function.  Patient is a 52 y.o. female presenting with back pain and pharyngitis.  Back Pain Sore Throat    Past Medical History  Diagnosis Date  . Depression   . Hyperlipidemia   . Sciatica   . Back pain   . Right leg pain   . Hip pain   . HSV-1 infection   . Tobacco user   . Anxiety    Past Surgical History  Procedure Laterality Date  . Tubal ligation    . Cataract extraction    . Tonsillectomy    . Cesarean section    . Bunionectomy    . Elbow surgery    . Rhytidectomy neck / cheek / chin    . Novasure ablation    . Colonoscopy w/ polypectomy     Family History  Problem Relation Age of Onset  . Brain cancer Mother   . Cancer - Other Father     Abdominal   . Colon cancer Sister   . Colon cancer Paternal Grandmother   . Colon cancer Sister    History  Substance Use  Topics  . Smoking status: Current Every Day Smoker -- 1.00 packs/day    Types: Cigarettes    Start date: 01/23/1976  . Smokeless tobacco: Not on file  . Alcohol Use: No   OB History   Grav Para Term Preterm Abortions TAB SAB Ect Mult Living   3 2 2  0 1 1 0 0 0 2     Review of Systems  HENT: Positive for congestion and sore throat.   Genitourinary: Negative.   Musculoskeletal: Positive for back pain.  All other systems reviewed and are negative.    Allergies  Codeine and Meperidine hcl  Home Medications   Current Outpatient Rx  Name  Route  Sig  Dispense  Refill  . acyclovir (ZOVIRAX) 800 MG tablet   Oral   Take 1 tablet (800 mg total) by mouth 5 (five) times daily.   35 tablet   0   . ALPRAZolam (XANAX) 1 MG tablet   Oral   Take 1 tablet (1 mg total) by mouth 2 (two) times daily as needed for anxiety.   60 tablet   2   . aspirin 81 MG tablet   Oral   Take 81 mg by mouth once.         Marland Kitchen  hydrocodone-ibuprofen (VICOPROFEN) 5-200 MG per tablet   Oral   Take 1 tablet by mouth every 8 (eight) hours as needed for pain.   60 tablet   3   . mupirocin ointment (BACTROBAN) 2 %   Topical   Apply topically 3 (three) times daily.   22 g   0   . omeprazole (PRILOSEC) 40 MG capsule   Oral   Take 40 mg by mouth daily.          BP 129/72  Pulse 79  Temp(Src) 98.3 F (36.8 C) (Oral)  Resp 16  SpO2 99% Physical Exam  Constitutional: She is oriented to person, place, and time. She appears well-developed and well-nourished. No distress.  HENT:  Head: Normocephalic and atraumatic.  Right Ear: Hearing normal.  Left Ear: Hearing normal.  Nose: Right sinus exhibits maxillary sinus tenderness. Left sinus exhibits maxillary sinus tenderness.  Mouth/Throat: Oropharynx is clear and moist and mucous membranes are normal.  Eyes: Conjunctivae and EOM are normal. Pupils are equal, round, and reactive to light.  Neck: Normal range of motion. Neck supple.  Cardiovascular:  Regular rhythm, S1 normal and S2 normal.  Exam reveals no gallop and no friction rub.   No murmur heard. Pulmonary/Chest: Effort normal and breath sounds normal. No respiratory distress. She exhibits no tenderness.  Abdominal: Soft. Normal appearance and bowel sounds are normal. There is no hepatosplenomegaly. There is no tenderness. There is no rebound, no guarding, no tenderness at McBurney's point and negative Murphy's sign. No hernia.  Musculoskeletal: Normal range of motion.       Thoracic back: She exhibits tenderness. She exhibits no bony tenderness.       Lumbar back: She exhibits tenderness. She exhibits no bony tenderness.  Is paraspinal tenderness in the thoracic and lumbar region  Neurological: She is alert and oriented to person, place, and time. She has normal strength. No cranial nerve deficit or sensory deficit. Coordination normal. GCS eye subscore is 4. GCS verbal subscore is 5. GCS motor subscore is 6.  Skin: Skin is warm, dry and intact. No rash noted. No cyanosis.  Psychiatric: She has a normal mood and affect. Her speech is normal and behavior is normal. Thought content normal.    ED Course  Procedures (including critical care time) Labs Review Labs Reviewed  URINALYSIS, ROUTINE W REFLEX MICROSCOPIC - Abnormal; Notable for the following:    Specific Gravity, Urine 1.004 (*)    Hgb urine dipstick MODERATE (*)    All other components within normal limits  URINE MICROSCOPIC-ADD ON   Imaging Review No results found.  MDM  Diagnosis: 1. Upper respiratory infection 2. Chronic back pain  Patient comes to the ER for evaluation of back pain. Patient has history of chronic back pain, currently sees a neurosurgeon for this. This indicates that she had a spinal injection a month ago without improvement and in the last few days symptoms have worsened. She has normal strength and sensation in the lower extremities. There are no neurologic findings on exam. She does not requiring  any imaging or workup at this time. Patient did drive herself to the hospital tonight, narcotic analgesia can be given. It was recommended that the patient follow up of her neurosurgeon for her chronic pain.  Patient also complaining of upper respiratory infection symptoms. She is very seen her primary care doctor for this and was given Zovirax for presumed HSV 1 infection of the nose. There is nothing to indicate a systemic infection. Does  not require any further workup or treatment for her upper respiratory infection.    Gilda Crease, MD 10/14/12 [phone]

## 2012-10-15 NOTE — ED Notes (Signed)
Patient currently waiting med time.  

## 2012-10-16 ENCOUNTER — Ambulatory Visit: Payer: Medicaid Other | Admitting: Family Medicine

## 2012-11-14 ENCOUNTER — Other Ambulatory Visit: Payer: Self-pay | Admitting: *Deleted

## 2012-11-14 DIAGNOSIS — F411 Generalized anxiety disorder: Secondary | ICD-10-CM

## 2012-11-14 DIAGNOSIS — Z760 Encounter for issue of repeat prescription: Secondary | ICD-10-CM

## 2012-11-14 MED ORDER — ALPRAZOLAM 1 MG PO TABS: 1.0000 mg | ORAL_TABLET | Freq: Two times a day (BID) | ORAL | Status: DC | PRN

## 2012-11-14 NOTE — Telephone Encounter (Signed)
Last filled 10/15/12, last seen 10/13/12 by FPW. If approved route [facility]

## 2012-11-14 NOTE — Telephone Encounter (Signed)
Called in.

## 2012-11-14 NOTE — Telephone Encounter (Signed)
Please phone in xanax rx 

## 2012-11-17 ENCOUNTER — Other Ambulatory Visit: Payer: Self-pay

## 2012-12-02 ENCOUNTER — Ambulatory Visit (INDEPENDENT_AMBULATORY_CARE_PROVIDER_SITE_OTHER): Payer: Medicaid Other | Admitting: Family Medicine

## 2012-12-02 VITALS — BP 114/64 | HR 74 | Temp 97.5°F | Ht 63.0 in | Wt 153.2 lb

## 2012-12-02 DIAGNOSIS — M545 Low back pain, unspecified: Secondary | ICD-10-CM

## 2012-12-02 DIAGNOSIS — M5431 Sciatica, right side: Secondary | ICD-10-CM

## 2012-12-02 DIAGNOSIS — M5430 Sciatica, unspecified side: Secondary | ICD-10-CM

## 2012-12-02 DIAGNOSIS — R937 Abnormal findings on diagnostic imaging of other parts of musculoskeletal system: Secondary | ICD-10-CM

## 2012-12-02 MED ORDER — PREDNISONE 10 MG PO TABS: ORAL_TABLET | ORAL | Status: DC

## 2012-12-02 MED ORDER — HYDROCODONE-ACETAMINOPHEN 5-325 MG PO TABS: ORAL_TABLET | ORAL | Status: DC

## 2012-12-02 NOTE — Progress Notes (Signed)
  Subjective:    Patient ID: [name], female    DOB: [birth year], 52 y.o.   MRN: [id number]  HPI Patient here today for back pain. Please see MRI report done in April 2014. She comes requesting pain pills and I explained to her that I can only write these for short period of time. She is willing to get another opinion because after reviewing the report she has a definite structural problem which needs surgical repair. She has had one series of injections to her back which he said did not help.     Review of Systems  Constitutional: Positive for fatigue (due to back pain).  HENT: Negative.   Eyes: Negative.   Respiratory: Negative.   Cardiovascular: Negative.   Endocrine: Negative.   Genitourinary: Negative.   Musculoskeletal: Positive for back pain (R hip radiating down leg).  Skin: Negative.   Allergic/Immunologic: Negative.   Neurological: Negative.   Hematological: Negative.   Psychiatric/Behavioral: Negative.        Objective:   Physical Exam  Nursing note and vitals reviewed. Constitutional: She appears well-developed and well-nourished. No distress.  Musculoskeletal: Normal range of motion. Tenderness: some tenderness in low back.  Neurological: She is alert. She has normal reflexes. She displays normal reflexes. No cranial nerve deficit.  Skin: Skin is dry. No rash noted.  Psychiatric: She has a normal mood and affect. Her behavior is normal. Judgment normal.   BP 114/64  Pulse 74  Temp(Src) 97.5 F (36.4 C) (Oral)  Ht 5\' 3"  (1.6 m)  Wt 153 lb 3.2 oz (69.491 kg)  BMI 27.14 kg/m2        Assessment & Plan:   1. Low back pain associated with a spinal disorder other than radiculopathy or spinal stenosis   2. Sciatica of right side   3. Sciatica, unspecified laterality   4. Abnormal MRI, lumbar spine    Orders Placed This Encounter  Procedures  . Ambulatory referral to Neurosurgery    Referral Priority:  Routine    Referral Type:  Surgical   Referral Reason:  Specialty Services Required    Requested Specialty:  Neurosurgery    Number of Visits Requested:  1   Meds ordered this encounter  Medications  . predniSONE (DELTASONE) 10 MG tablet    Sig: 1 tablet 4 times a day for 3 days,  1 tablet 3 times a day for 3 days,  1 tablet 2 times a day for 3 days, 1 tablet daily for 3 days    Dispense:  30 tablet    Refill:  0  . HYDROcodone-acetaminophen (NORCO/VICODIN) 5-325 MG per tablet    Sig: One every 8 hours as needed for severe pain    Dispense:  30 tablet    Refill:  0   Patient Instructions  Avoid lifting pushing pulling or straining Use warm wet compresses to back 20 minutes 4 times daily Try to walk regularly We will arrange an appointment with Dr. Jeral Fruit Please take x-ray report reviewed to the visit Please remember that we can only write for a limited number of pain pills   Nyra Capes MD

## 2012-12-02 NOTE — Patient Instructions (Signed)
Avoid lifting pushing pulling or straining Use warm wet compresses to back 20 minutes 4 times daily Try to walk regularly We will arrange an appointment with Dr. Jeral Fruit Please take x-ray report reviewed to the visit Please remember that we can only write for a limited number of pain pills

## 2012-12-22 ENCOUNTER — Encounter (HOSPITAL_COMMUNITY): Payer: Self-pay | Admitting: Emergency Medicine

## 2012-12-22 ENCOUNTER — Emergency Department (HOSPITAL_COMMUNITY)
Admission: EM | Admit: 2012-12-22 | Discharge: 2012-12-22 | Disposition: A | Discharge status: Home / Self Care | Payer: Medicaid Other | Attending: Emergency Medicine | Admitting: Emergency Medicine

## 2012-12-22 DIAGNOSIS — Y9229 Other specified public building as the place of occurrence of the external cause: Secondary | ICD-10-CM | POA: Insufficient documentation

## 2012-12-22 DIAGNOSIS — M549 Dorsalgia, unspecified: Secondary | ICD-10-CM | POA: Insufficient documentation

## 2012-12-22 DIAGNOSIS — Z79899 Other long term (current) drug therapy: Secondary | ICD-10-CM | POA: Insufficient documentation

## 2012-12-22 DIAGNOSIS — Z885 Allergy status to narcotic agent status: Secondary | ICD-10-CM | POA: Insufficient documentation

## 2012-12-22 DIAGNOSIS — R296 Repeated falls: Secondary | ICD-10-CM | POA: Insufficient documentation

## 2012-12-22 DIAGNOSIS — Z8619 Personal history of other infectious and parasitic diseases: Secondary | ICD-10-CM | POA: Insufficient documentation

## 2012-12-22 DIAGNOSIS — W19XXXA Unspecified fall, initial encounter: Secondary | ICD-10-CM

## 2012-12-22 DIAGNOSIS — IMO0002 Reserved for concepts with insufficient information to code with codable children: Secondary | ICD-10-CM | POA: Insufficient documentation

## 2012-12-22 DIAGNOSIS — Z7982 Long term (current) use of aspirin: Secondary | ICD-10-CM | POA: Insufficient documentation

## 2012-12-22 DIAGNOSIS — M542 Cervicalgia: Secondary | ICD-10-CM | POA: Insufficient documentation

## 2012-12-22 DIAGNOSIS — F411 Generalized anxiety disorder: Secondary | ICD-10-CM | POA: Insufficient documentation

## 2012-12-22 DIAGNOSIS — M25519 Pain in unspecified shoulder: Secondary | ICD-10-CM | POA: Insufficient documentation

## 2012-12-22 DIAGNOSIS — F3289 Other specified depressive episodes: Secondary | ICD-10-CM | POA: Insufficient documentation

## 2012-12-22 DIAGNOSIS — Y9301 Activity, walking, marching and hiking: Secondary | ICD-10-CM | POA: Insufficient documentation

## 2012-12-22 DIAGNOSIS — Z888 Allergy status to other drugs, medicaments and biological substances status: Secondary | ICD-10-CM | POA: Insufficient documentation

## 2012-12-22 DIAGNOSIS — F17200 Nicotine dependence, unspecified, uncomplicated: Secondary | ICD-10-CM | POA: Insufficient documentation

## 2012-12-22 DIAGNOSIS — E785 Hyperlipidemia, unspecified: Secondary | ICD-10-CM | POA: Insufficient documentation

## 2012-12-22 DIAGNOSIS — F329 Major depressive disorder, single episode, unspecified: Secondary | ICD-10-CM | POA: Insufficient documentation

## 2012-12-22 DIAGNOSIS — R51 Headache: Secondary | ICD-10-CM | POA: Insufficient documentation

## 2012-12-22 MED ORDER — DIAZEPAM 5 MG PO TABS: 5.0000 mg | ORAL_TABLET | Freq: Two times a day (BID) | ORAL | Status: DC

## 2012-12-22 MED ORDER — HYDROCODONE-ACETAMINOPHEN 5-325 MG PO TABS: 2.0000 | ORAL_TABLET | ORAL | Status: DC | PRN

## 2012-12-22 NOTE — Progress Notes (Signed)
ED CM [facility] in regards to prescription written to day by Irish Elders PA-C who worked under Dr. Eber Hong. Pharmacist state that [facility] was not registered under Medicaid to write prescription so she will have to fill them under the covering MD, Dr. Hyacinth Meeker.

## 2012-12-22 NOTE — ED Notes (Signed)
Pt was pushing buggy at [employer] and hit this liquid on the floor and fell back wards on back.  Pt states had right groin pain and got knot on left leg last Monday.  Now she is having pain in posterior neck and left posterior back pain that is starting to radiate around.  Hurts with movement or if she sneezes it hurts terrible.

## 2012-12-22 NOTE — ED Provider Notes (Signed)
CSN: [id number]     Arrival date & time 12/22/12  1324 History  This chart was scribed for Irish Elders, NP, working with Leonette Most B. Bernette Mayers, MD, by Ardelia Mems ED Scribe. This patient was seen in room TR08C/TR08C and the patient's care was started at 2:13 PM.  Chief Complaint  Patient presents with  . Fall  . Shoulder Pain    The history is provided by the patient. No language interpreter was used.    HPI Comments: [name] is a 52 y.o. female who presents to the Emergency Department complaining of a fall that occurred while pt was pushing a buggy at [employer] 1 week ago. She states that she was looking at the ceiling, and her left leg went under the buggy, causing her to "do a split" and fall. She denies head injury or LOC pertaining to the fall. She states that she believes she pulled a muscle in her right thigh and is having pain to that area over the past week. She also states that she is having intermittent, mild neck pain, left shoulder pain and headaches onset after the fall. She states that she has not been taking any medications for pain, or making other attempts to improve her pain. She denies any other pain or symptoms.  PCP- Dr. Rudi Heap   Past Medical History  Diagnosis Date  . Depression   . Hyperlipidemia   . Sciatica   . Back pain   . Right leg pain   . Hip pain   . HSV-1 infection   . Tobacco user   . Anxiety    Past Surgical History  Procedure Laterality Date  . Tubal ligation    . Cataract extraction    . Tonsillectomy    . Cesarean section    . Bunionectomy    . Elbow surgery    . Rhytidectomy neck / cheek / chin    . Novasure ablation    . Colonoscopy w/ polypectomy     Family History  Problem Relation Age of Onset  . Brain cancer Mother   . Cancer - Other Father     Abdominal   . Colon cancer Sister   . Colon cancer Paternal Grandmother   . Colon cancer Sister    History  Substance Use Topics  . Smoking status: Current Every Day  Smoker -- 1.00 packs/day    Types: Cigarettes    Start date: 01/23/1976  . Smokeless tobacco: Not on file     Comment: using the patch  . Alcohol Use: No   OB History   Grav Para Term Preterm Abortions TAB SAB Ect Mult Living   3 2 2  0 1 1 0 0 0 2     Review of Systems  Musculoskeletal: Positive for arthralgias (left shoulder), myalgias (right thigh) and neck pain.  Neurological: Positive for headaches. Negative for syncope.  All other systems reviewed and are negative.   Allergies  Codeine and Meperidine hcl  Home Medications   Current Outpatient Rx  Name  Route  Sig  Dispense  Refill  . ALPRAZolam (XANAX) 1 MG tablet   Oral   Take 1 tablet (1 mg total) by mouth 2 (two) times daily as needed for anxiety.   60 tablet   1   . aspirin 81 MG tablet   Oral   Take 81 mg by mouth once.         Marland Kitchen HYDROcodone-acetaminophen (NORCO/VICODIN) 5-325 MG per tablet   Oral  Take 1 tablet by mouth every 6 (six) hours as needed for moderate pain.         Marland Kitchen omeprazole (PRILOSEC) 40 MG capsule   Oral   Take 40 mg by mouth daily as needed (heartburn).          . SIMVASTATIN PO   Oral   Take 1 tablet by mouth daily.         . diazepam (VALIUM) 5 MG tablet   Oral   Take 1 tablet (5 mg total) by mouth 2 (two) times daily.   10 tablet   0   . HYDROcodone-acetaminophen (NORCO/VICODIN) 5-325 MG per tablet   Oral   Take 2 tablets by mouth every 4 (four) hours as needed.   10 tablet   0    Triage Vitals: BP 104/75  Pulse 77  Temp(Src) 98.2 F (36.8 C) (Oral)  Resp 20  Wt 151 lb 14.4 oz (68.901 kg)  SpO2 98%  Physical Exam  Nursing note and vitals reviewed. Constitutional: She is oriented to person, place, and time. She appears well-developed and well-nourished. No distress.  HENT:  Head: Normocephalic and atraumatic.  Eyes: EOM are normal.  Neck: Neck supple. No tracheal deviation present.  Cardiovascular: Normal rate.   Pulmonary/Chest: Effort normal. No  respiratory distress.  Musculoskeletal: Normal range of motion.  Neurological: She is alert and oriented to person, place, and time.  Skin: Skin is warm and dry.  Psychiatric: She has a normal mood and affect. Her behavior is normal.    ED Course  Procedures (including critical care time)  DIAGNOSTIC STUDIES: Oxygen Saturation is 98% on RA, normal by my interpretation.    COORDINATION OF CARE: 2:19 PM- Discussed plan for pt to receive pain medication in the ED. Discussed suspicion that pt's injuries are muscular in nature, and that radiology is not indicated. Will discharge with Valium and Norco. Pt advised of plan for treatment and pt agrees.  Labs Review Labs Reviewed - No data to display Imaging Review No results found.  EKG Interpretation   None       MDM   1. Fall, initial encounter     Muscle strain, post-fall. Muscle tension in shoulders and lateral neck. No midline tenderness. No gait deficits, ambulating without difficulty. No numbness, tingling, focal deficits or weakness.   I personally performed the services described in this documentation, which was scribed in my presence. The recorded information has been reviewed and is accurate.    Irish Elders, NP 12/23/12 [id number]

## 2012-12-22 NOTE — ED Notes (Signed)
PT comfortable with d/c and f/u instructions. Prescriptions x2 

## 2012-12-23 NOTE — ED Provider Notes (Signed)
Medical screening examination/treatment/procedure(s) were performed by non-physician practitioner and as supervising physician I was immediately available for consultation/collaboration.  EKG Interpretation   None         Charles B. Sheldon, MD 12/23/12 1341 

## 2012-12-30 ENCOUNTER — Ambulatory Visit (INDEPENDENT_AMBULATORY_CARE_PROVIDER_SITE_OTHER): Payer: Medicaid Other | Admitting: General Practice

## 2012-12-30 ENCOUNTER — Encounter: Payer: Self-pay | Admitting: General Practice

## 2012-12-30 ENCOUNTER — Ambulatory Visit (INDEPENDENT_AMBULATORY_CARE_PROVIDER_SITE_OTHER): Payer: Medicaid Other

## 2012-12-30 VITALS — BP 108/71 | HR 64 | Temp 98.0°F | Ht 63.0 in | Wt 152.0 lb

## 2012-12-30 DIAGNOSIS — M542 Cervicalgia: Secondary | ICD-10-CM

## 2012-12-30 DIAGNOSIS — Z5189 Encounter for other specified aftercare: Secondary | ICD-10-CM

## 2012-12-30 DIAGNOSIS — S46819D Strain of other muscles, fascia and tendons at shoulder and upper arm level, unspecified arm, subsequent encounter: Secondary | ICD-10-CM

## 2012-12-30 MED ORDER — IBUPROFEN 800 MG PO TABS: 800.0000 mg | ORAL_TABLET | Freq: Three times a day (TID) | ORAL | Status: DC | PRN

## 2012-12-30 MED ORDER — CYCLOBENZAPRINE HCL 10 MG PO TABS: 10.0000 mg | ORAL_TABLET | Freq: Two times a day (BID) | ORAL | Status: DC | PRN

## 2012-12-30 NOTE — Patient Instructions (Signed)
Muscle Strain  Muscle strain occurs when a muscle is stretched beyond its normal length. A small number of muscle fibers generally are torn. This is especially common in athletes. This happens when a sudden, violent force placed on a muscle stretches it too far. Usually, recovery from muscle strain takes 1 to 2 weeks. Complete healing will take 5 to 6 weeks.   HOME CARE INSTRUCTIONS    While awake, apply ice to the sore muscle for the first 2 days after the injury.   Put ice in a plastic bag.   Place a towel between your skin and the bag.   Leave the ice on for 15-20 minutes each hour.   Do not use the strained muscle for several days, until you no longer have pain.   You may wrap the injured area with an elastic bandage for comfort. Be careful not to wrap it too tightly. This may interfere with blood circulation or increase swelling.   Only take over-the-counter or prescription medicines for pain, discomfort, or fever as directed by your caregiver.  SEEK MEDICAL CARE IF:   You have increasing pain or swelling in the injured area.  MAKE SURE YOU:    Understand these instructions.   Will watch your condition.   Will get help right away if you are not doing well or get worse.  Document Released: 01/08/2005 Document Revised: 04/02/2011 Document Reviewed: 01/20/2011  [employer] Patient Information 2014 [employer].

## 2012-12-30 NOTE — Progress Notes (Signed)
   Subjective:    Patient ID: [name], female    DOB: [birth year], 52 y.o.   MRN: [id number]  HPI Patient presents today with complaints of neck and shoulder pain. She reports falling at a store on 12/15/12 and followed up in the emergency room. Reports emergency room prescribed her a muscle relaxer. Reports having some numbness and tingling in right hand 3-5 fingers. Patient reports worsening of shoulder and neck pain after 1 and a half weeks. Reports taking and completing medications that were prescribed by emergency department.     Review of Systems  Constitutional: Negative for fever and chills.  HENT: Negative for ear pain.   Eyes: Negative for pain.  Respiratory: Negative for chest tightness and shortness of breath.   Cardiovascular: Negative for chest pain, palpitations and leg swelling.  Gastrointestinal: Negative for abdominal pain, diarrhea, constipation, blood in stool and abdominal distention.  Genitourinary: Negative for dysuria and difficulty urinating.  Musculoskeletal:       Neck and shoulder pain  Neurological: Negative for dizziness, weakness and headaches.       Objective:   Physical Exam  Constitutional: She is oriented to person, place, and time. She appears well-developed and well-nourished.  HENT:  Head: Normocephalic and atraumatic.  Right Ear: External ear normal.  Left Ear: External ear normal.  Mouth/Throat: Oropharynx is clear and moist.  Eyes: EOM are normal. Pupils are equal, round, and reactive to light.  Neck: Normal range of motion. Neck supple. No thyromegaly present.  Cardiovascular: Normal rate, regular rhythm and normal heart sounds.   Pulmonary/Chest: Effort normal and breath sounds normal. No respiratory distress. She exhibits no tenderness.  Abdominal: Soft. Bowel sounds are normal. She exhibits no distension. There is no tenderness.  Musculoskeletal: She exhibits tenderness. She exhibits no edema.  Trapezius tenderness and tightness  noted upon palpation  Lymphadenopathy:    She has no cervical adenopathy.  Neurological: She is alert and oriented to person, place, and time.  Skin: Skin is warm and dry.  Psychiatric: She has a normal mood and affect.      [facility] reading (PRIMARY) by Coralie Keens, FNP-C, mild degenerative disc changes noted, no fracture or dislocation.                                 Assessment & Plan:  1. Neck pain  - DG Cervical Spine Complete; Future  2. Strain of trapezius muscle, unspecified laterality, subsequent encounter  - cyclobenzaprine (FLEXERIL) 10 MG tablet; Take 1 tablet (10 mg total) by mouth 2 (two) times daily as needed for muscle spasms.  Dispense: 20 tablet; Refill: 0 - ibuprofen (ADVIL,MOTRIN) 800 MG tablet; Take 1 tablet (800 mg total) by mouth every 8 (eight) hours as needed.  Dispense: 20 tablet; Refill: 0 -sedation precaution discussed -RTO if symptoms worsen or unresolved, will refer to ortho specialist -Patient verbalized understanding Coralie Keens, FNP-C

## 2013-01-06 ENCOUNTER — Other Ambulatory Visit: Payer: Self-pay | Admitting: Nurse Practitioner

## 2013-01-07 NOTE — Telephone Encounter (Signed)
Last seen 12/30/12  [name]  If approved route to nurse [facility] 

## 2013-01-08 ENCOUNTER — Telehealth: Payer: Self-pay | Admitting: General Practice

## 2013-01-08 NOTE — Telephone Encounter (Signed)
Please phone xanax rx

## 2013-01-09 ENCOUNTER — Other Ambulatory Visit: Payer: Self-pay | Admitting: Nurse Practitioner

## 2013-01-09 NOTE — Telephone Encounter (Signed)
Was done 01/06/13 according to chart

## 2013-01-09 NOTE — Telephone Encounter (Signed)
[facility] at [facility].

## 2013-02-09 ENCOUNTER — Other Ambulatory Visit: Payer: Self-pay | Admitting: Nurse Practitioner

## 2013-02-10 ENCOUNTER — Other Ambulatory Visit: Payer: Self-pay | Admitting: General Practice

## 2013-02-10 ENCOUNTER — Telehealth: Payer: Self-pay | Admitting: Nurse Practitioner

## 2013-02-10 NOTE — Telephone Encounter (Signed)
Last seen 12/30/12  [name]  If approved route to nurse [facility]

## 2013-02-10 NOTE — Telephone Encounter (Signed)
Patient needs to be seen to discuss other options, besides xanax.

## 2013-02-10 NOTE — Telephone Encounter (Signed)
Please find out how long patient been taking and who prescribed

## 2013-02-10 NOTE — Telephone Encounter (Signed)
Mitzi Hansen wrote these been a little over a year because she tried all kinds of everything

## 2013-02-10 NOTE — Telephone Encounter (Signed)
She said she has tried all kinds of things and that it was the only thing that has worked

## 2013-02-11 ENCOUNTER — Ambulatory Visit (INDEPENDENT_AMBULATORY_CARE_PROVIDER_SITE_OTHER): Payer: Medicaid Other | Admitting: General Practice

## 2013-02-11 VITALS — BP 105/67 | HR 62 | Temp 97.0°F | Ht 63.0 in | Wt 152.0 lb

## 2013-02-11 DIAGNOSIS — F411 Generalized anxiety disorder: Secondary | ICD-10-CM

## 2013-02-11 MED ORDER — ALPRAZOLAM 0.5 MG PO TABS: 0.5000 mg | ORAL_TABLET | Freq: Two times a day (BID) | ORAL | Status: DC | PRN

## 2013-02-11 NOTE — Progress Notes (Signed)
   Subjective:    Patient ID: [name], female    DOB: [birth year], 53 y.o.   MRN: [id number]  HPI Patient presents today with complaints of anxiety. She reports xanax is very effective in managing her anxiety. Also reports she has tried other medications and they were not effective. Patient denies being seen by a Medical illustrator.     Review of Systems  Constitutional: Negative for fever and chills.  Respiratory: Negative for chest tightness and shortness of breath.   Cardiovascular: Negative for chest pain and palpitations.  Psychiatric/Behavioral: The patient is nervous/anxious.        Objective:   Physical Exam  Constitutional: She is oriented to person, place, and time. She appears well-developed and well-nourished.  Cardiovascular: Normal rate, regular rhythm and normal heart sounds.   Pulmonary/Chest: Effort normal and breath sounds normal. No respiratory distress. She exhibits no tenderness.  Neurological: She is alert and oriented to person, place, and time.  Skin: Skin is warm and dry.  Psychiatric: She has a normal mood and affect.          Assessment & Plan:  1. Generalized anxiety disorder - ALPRAZolam (XANAX) 0.5 MG tablet; Take 1 tablet (0.5 mg total) by mouth 2 (two) times daily as needed for anxiety.  Dispense: 60 tablet; Refill: 1 -discussed tapering off xanax, will decrease dose from 1mg  twice daily to 0.5mg  twice daily for now -list of professional counseling centers provided -Patient verbalized understanding -Follow up in 2 months and prn -Patient verbalized understanding Erby Pian, FNP-C

## 2013-02-11 NOTE — Patient Instructions (Addendum)
Generalized Anxiety Disorder Generalized anxiety disorder (GAD) is a mental disorder. It interferes with life functions, including relationships, work, and school. GAD is different from normal anxiety, which everyone experiences at some point in their lives in response to specific life events and activities. Normal anxiety actually helps [place] prepare for and get through these life events and activities. Normal anxiety goes away after the event or activity is over.  GAD causes anxiety that is not necessarily related to specific events or activities. It also causes excess anxiety in proportion to specific events or activities. The anxiety associated with GAD is also difficult to control. GAD can vary from mild to severe. People with severe GAD can have intense waves of anxiety with physical symptoms (panic attacks).  SYMPTOMS The anxiety and worry associated with GAD are difficult to control. This anxiety and worry are related to many life events and activities and also occur more days than not for 6 months or longer. People with GAD also have three or more of the following symptoms (one or more in children):  Restlessness.   Fatigue.  Difficulty concentrating.   Irritability.  Muscle tension.  Difficulty sleeping or unsatisfying sleep. DIAGNOSIS GAD is diagnosed through an assessment by your caregiver. Your caregiver will ask you questions aboutyour mood,physical symptoms, and events in your life. Your caregiver may ask you about your medical history and use of alcohol or drugs, including prescription medications. Your caregiver may also do a physical exam and blood tests. Certain medical conditions and the use of certain substances can cause symptoms similar to those associated with GAD. Your caregiver may refer you to a mental health specialist for further evaluation. TREATMENT The following therapies are usually used to treat GAD:   Medication Antidepressant medication usually is  prescribed for long-term daily control. Antianxiety medications may be added in severe cases, especially when panic attacks occur.   Talk therapy (psychotherapy) Certain types of talk therapy can be helpful in treating GAD by providing support, education, and guidance. A form of talk therapy called cognitive behavioral therapy can teach you healthy ways to think about and react to daily life events and activities.  Stress managementtechniques These include yoga, meditation, and exercise and can be very helpful when they are practiced regularly. A mental health specialist can help determine which treatment is best for you. Some people see improvement with one therapy. However, other people require a combination of therapies. Document Released: 05/05/2012 Document Reviewed: 05/05/2012 [facility] Patient Information 2014 [place], [place].  [facility] [address] [place] ,[place] [address] [phone] Children limited to anxiety and depression- NO ADD/ADHD Does not accept Medicaid  [facility] [address] [place], [place] Does see children Does accept medicaid Will assess for Autism but not treat  [facility] [place]. [address] [place] [phone] Does see children  Does accept Medicaid Medication management- substance abuse- bipolar- grief- family-marriage- OCD- Anxiety- PTSD  [facility] [place] [facility] [place] Does see children Does accept medicaid They do perform psychological testing  [place] [address] [place] Schedule through [place]. [phone] Patient must call and make own appointment Does se children Does accept Medicaid  [facility] [place], [place] Sees Children 7-10 accompanied by an adult, 11 and up by themselves Does accept Medicaid Will  see patients with- substance abuse-ADHD-ADD-Bipolar-Domestic violence-Marriage counseling- Family Counseling and sexual abuse  [place] [facility] [address] [place] [phone] Does see children Does accept [facility] [place] [phone]  Dr. Sabra Heck-  Psychiatrist [address], [place] Specializes in ADHD and addictions They do ADHD testing Suboxone clinic  [facility] [address] [place] [phone] Does Child psychological testing  [facility] [address] Dr. Dellia [place] [phone] Does Accept Medicaid Evaluates for Autism  [facility] [place] [phone] Does Not accept Medicaid Does do adult ADD evaluations  Dr. Lorenza Evangelist [address], [address] [place] [phone] Does not Take Medicaid Sees ADD and ADHD for treatment      [facility] [address], [place] [address] [phone] Takes Medicaid WIll see children as young as 47

## 2013-02-12 NOTE — Telephone Encounter (Signed)
Patient came in yesterday to see [name] and was taken care of then

## 2013-02-24 ENCOUNTER — Ambulatory Visit (INDEPENDENT_AMBULATORY_CARE_PROVIDER_SITE_OTHER): Payer: Medicaid Other | Admitting: Family Medicine

## 2013-02-24 ENCOUNTER — Encounter: Payer: Self-pay | Admitting: Family Medicine

## 2013-02-24 VITALS — BP 110/69 | HR 60 | Temp 98.5°F | Ht 63.0 in | Wt 155.6 lb

## 2013-02-24 DIAGNOSIS — J069 Acute upper respiratory infection, unspecified: Secondary | ICD-10-CM

## 2013-02-24 MED ORDER — AMOXICILLIN 875 MG PO TABS: 875.0000 mg | ORAL_TABLET | Freq: Two times a day (BID) | ORAL | Status: DC

## 2013-02-24 NOTE — Progress Notes (Signed)
   Subjective:    Patient ID: [name], female    DOB: 05/31/1960, 53 y.o.   MRN: [id number]  HPI  This 53 y.o. female presents for evaluation of uri sx's for over a week.  Review of Systems No chest pain, SOB, HA, dizziness, vision change, N/V, diarrhea, constipation, dysuria, urinary urgency or frequency, myalgias, arthralgias or rash.     Objective:   Physical Exam Vital signs noted  Well developed well nourished female.  HEENT - Head atraumatic Normocephalic                Eyes - PERRLA, Conjuctiva - clear Sclera- Clear EOMI                Ears - EAC's Wnl TM's Wnl Gross Hearing WNL                Nose - Nares patent                 Throat - oropharanx wnl Respiratory - Lungs CTA bilateral Cardiac - RRR S1 and S2 without murmur GI - Abdomen soft Nontender and bowel sounds active x 4 Extremities - No edema. Neuro - Grossly intact.       Assessment & Plan:  URI (upper respiratory infection) - Plan: amoxicillin (AMOXIL) 875 MG tablet Push po fluids, rest, tylenol and motrin otc prn as directed for fever, arthralgias, and myalgias.  Follow up prn if sx's continue or persist.  Lysbeth Penner FNP

## 2013-04-07 ENCOUNTER — Other Ambulatory Visit: Payer: Self-pay | Admitting: Family Medicine

## 2013-04-07 ENCOUNTER — Other Ambulatory Visit: Payer: Self-pay | Admitting: *Deleted

## 2013-04-07 DIAGNOSIS — F411 Generalized anxiety disorder: Secondary | ICD-10-CM

## 2013-04-07 MED ORDER — ALPRAZOLAM 1 MG PO TABS: 1.0000 mg | ORAL_TABLET | Freq: Two times a day (BID) | ORAL | Status: DC | PRN

## 2013-04-07 NOTE — Telephone Encounter (Signed)
Patient isn't tolerating the lower dose of xanax well. She has taken it for 2 months and hasn't adjusted well.  Appt scheduled for tomorrow but will need a refill for today.  Can you provide a refill to call in today?

## 2013-04-07 NOTE — Telephone Encounter (Signed)
Patient aware. She will schedule f/u appt before next refill is due and will let us know if she has any problems.

## 2013-04-07 NOTE — Telephone Encounter (Signed)
[facility] 

## 2013-04-08 ENCOUNTER — Ambulatory Visit: Payer: Medicaid Other | Admitting: General Practice

## 2013-04-14 ENCOUNTER — Ambulatory Visit (INDEPENDENT_AMBULATORY_CARE_PROVIDER_SITE_OTHER): Payer: Medicaid Other | Admitting: Family Medicine

## 2013-04-14 VITALS — BP 112/72 | HR 69 | Temp 98.4°F | Ht 63.0 in | Wt 155.4 lb

## 2013-04-14 DIAGNOSIS — J209 Acute bronchitis, unspecified: Secondary | ICD-10-CM

## 2013-04-14 MED ORDER — BENZONATATE 100 MG PO CAPS: 100.0000 mg | ORAL_CAPSULE | Freq: Three times a day (TID) | ORAL | Status: DC | PRN

## 2013-04-14 MED ORDER — AMOXICILLIN 875 MG PO TABS: 875.0000 mg | ORAL_TABLET | Freq: Two times a day (BID) | ORAL | Status: DC

## 2013-04-14 MED ORDER — METHYLPREDNISOLONE (PAK) 4 MG PO TABS: ORAL_TABLET | ORAL | Status: DC

## 2013-04-14 MED ORDER — ALBUTEROL SULFATE HFA 108 (90 BASE) MCG/ACT IN AERS: 2.0000 | INHALATION_SPRAY | Freq: Four times a day (QID) | RESPIRATORY_TRACT | Status: DC | PRN

## 2013-04-14 NOTE — Progress Notes (Signed)
   Subjective:    Patient ID: [name], female    DOB: Dec 13, 1960, 53 y.o.   MRN: [id number]  HPI  This 53 y.o. female presents for evaluation of cough, congestion, aches, mucopurulent productive cough.  She is a smoker.  Review of Systems    No chest pain, SOB, HA, dizziness, vision change, N/V, diarrhea, constipation, dysuria, urinary urgency or frequency, myalgias, arthralgias or rash.  Objective:   Physical Exam  Vital signs noted  Well developed well nourished female.  HEENT - Head atraumatic Normocephalic                Eyes - PERRLA, Conjuctiva - clear Sclera- Clear EOMI                Ears - EAC's Wnl TM's Wnl Gross Hearing WNL                Throat - oropharanx wnl Respiratory - Lungs CTA bilateral Cardiac - RRR S1 and S2 without murmur GI - Abdomen soft Nontender and bowel sounds active x 4 Extremities - No edema. Neuro - Grossly intact.      Assessment & Plan:  Acute bronchitis - Plan: amoxicillin (AMOXIL) 875 MG tablet, benzonatate (TESSALON PERLES) 100 MG capsule, methylPREDNIsolone (MEDROL DOSPACK) 4 MG tablet, albuterol (PROVENTIL HFA;VENTOLIN HFA) 108 (90 BASE) MCG/ACT inhaler  Push po fluids, rest, tylenol and motrin otc prn as directed for fever, arthralgias, and myalgias.  Follow up prn if sx's continue or persist.  Lysbeth Penner FNP

## 2013-04-30 ENCOUNTER — Encounter: Payer: Self-pay | Admitting: Family Medicine

## 2013-04-30 ENCOUNTER — Ambulatory Visit (INDEPENDENT_AMBULATORY_CARE_PROVIDER_SITE_OTHER): Payer: Medicaid Other | Admitting: Family Medicine

## 2013-04-30 VITALS — BP 98/67 | HR 62 | Temp 98.2°F | Ht 63.0 in | Wt 156.0 lb

## 2013-04-30 DIAGNOSIS — J209 Acute bronchitis, unspecified: Secondary | ICD-10-CM

## 2013-04-30 DIAGNOSIS — K219 Gastro-esophageal reflux disease without esophagitis: Secondary | ICD-10-CM

## 2013-04-30 MED ORDER — OMEPRAZOLE 40 MG PO CPDR: 40.0000 mg | DELAYED_RELEASE_CAPSULE | Freq: Every day | ORAL | Status: DC

## 2013-04-30 MED ORDER — AMOXICILLIN 875 MG PO TABS: 875.0000 mg | ORAL_TABLET | Freq: Two times a day (BID) | ORAL | Status: DC

## 2013-04-30 NOTE — Progress Notes (Signed)
   Subjective:    Patient ID: [name], female    DOB: 03-Dec-1960, 53 y.o.   MRN: [id number]  HPI This 53 y.o. female presents for evaluation of uri sx's and cough.   She has hx Of tobacco abuse.  She was tx for bronchitis recently and is doing better. She is having GERD sx's.   Review of Systems C/o cough and uri sx's and GERD sx's   No chest pain, SOB, HA, dizziness, vision change, N/V, diarrhea, constipation, dysuria, urinary urgency or frequency, myalgias, arthralgias or rash.  Objective:   Physical Exam  Vital signs noted  Well developed well nourished female.  HEENT - Head atraumatic Normocephalic                Eyes - PERRLA, Conjuctiva - clear Sclera- Clear EOMI                Ears - EAC's Wnl TM's Wnl Gross Hearing WNL                Throat - oropharanx wnl Respiratory - Lungs CTA bilateral Cardiac - RRR S1 and S2 without murmur GI - Abdomen soft Nontender and bowel sounds active x 4       Assessment & Plan:  Acute bronchitis - Plan: amoxicillin (AMOXIL) 875 MG tablet po bid x 10 days Push po fluids, rest, tylenol and motrin otc prn as directed for fever, arthralgias, and myalgias.  Follow up prn if sx's continue or persist.  GERD (gastroesophageal reflux disease) - Plan: omeprazole (PRILOSEC) 40 MG capsule po qd  Tobacco abuse - DC smoking discussed.  Lysbeth Penner FNP

## 2013-06-05 ENCOUNTER — Other Ambulatory Visit: Payer: Self-pay | Admitting: *Deleted

## 2013-06-05 DIAGNOSIS — F411 Generalized anxiety disorder: Secondary | ICD-10-CM

## 2013-06-05 MED ORDER — ALPRAZOLAM 1 MG PO TABS: 1.0000 mg | ORAL_TABLET | Freq: Two times a day (BID) | ORAL | Status: DC | PRN

## 2013-06-05 NOTE — Telephone Encounter (Signed)
Last filled 05/07/13, last seen 01/2013. [facility]

## 2013-06-05 NOTE — Telephone Encounter (Signed)
Patient NTBS for follow up and lab work Please call in xanax with 0 refills 

## 2013-06-06 NOTE — Telephone Encounter (Signed)
Phoned in sat am

## 2013-08-04 ENCOUNTER — Other Ambulatory Visit: Payer: Self-pay | Admitting: Nurse Practitioner

## 2013-08-04 NOTE — Telephone Encounter (Signed)
Please call in xanax with 1 refills 

## 2013-08-04 NOTE — Telephone Encounter (Signed)
Last ov 4/15. Last refill 07/08/13. If approved call to [facility].

## 2013-08-04 NOTE — Telephone Encounter (Signed)
Called to pharmacy 

## 2013-08-24 ENCOUNTER — Ambulatory Visit (INDEPENDENT_AMBULATORY_CARE_PROVIDER_SITE_OTHER): Payer: Medicaid Other | Admitting: Family

## 2013-08-24 ENCOUNTER — Other Ambulatory Visit: Payer: Self-pay | Admitting: Family Medicine

## 2013-08-24 VITALS — BP 100/70 | HR 59 | Temp 98.1°F | Ht 63.0 in | Wt 153.4 lb

## 2013-08-24 DIAGNOSIS — R609 Edema, unspecified: Secondary | ICD-10-CM

## 2013-08-24 DIAGNOSIS — G609 Hereditary and idiopathic neuropathy, unspecified: Secondary | ICD-10-CM

## 2013-08-24 DIAGNOSIS — M792 Neuralgia and neuritis, unspecified: Secondary | ICD-10-CM

## 2013-08-24 MED ORDER — GABAPENTIN 300 MG PO CAPS: 300.0000 mg | ORAL_CAPSULE | Freq: Three times a day (TID) | ORAL | Status: DC

## 2013-08-24 NOTE — Patient Instructions (Signed)

## 2013-08-24 NOTE — Progress Notes (Signed)
   Subjective:    Patient ID: [name], female    DOB: [birth year], 53 y.o.   MRN: [id number]  HPI Pt presents to the office for bilateral swelling in legs and hands with a burning sensation that started 3 days ago. Pt states both legs, hands, and  feet burn . Pt states it is constant 10 out 10. Pt states walking makes the pain worse. Pt denies any numbness or tingling.    Review of Systems  Constitutional: Negative.   HENT: Negative.   Eyes: Negative.   Respiratory: Negative.  Negative for shortness of breath.   Cardiovascular: Positive for leg swelling. Negative for palpitations.  Gastrointestinal: Negative.   Endocrine: Negative.   Genitourinary: Negative.   Musculoskeletal: Negative.   Neurological: Negative.  Negative for headaches.  Hematological: Negative.   Psychiatric/Behavioral: Negative.   All other systems reviewed and are negative.      Objective:   Physical Exam  Vitals reviewed. Constitutional: She is oriented to person, place, and time. She appears well-developed and well-nourished. No distress.  Cardiovascular: Normal rate, regular rhythm, normal heart sounds and intact distal pulses.   No murmur heard. Pulmonary/Chest: Effort normal and breath sounds normal. No respiratory distress. She has no wheezes.  Abdominal: Soft. Bowel sounds are normal. She exhibits no distension. There is no tenderness.  Musculoskeletal: Normal range of motion. She exhibits edema (Trace amt of edema in bilateral hands). She exhibits no tenderness.  Neurological: She is alert and oriented to person, place, and time. She has normal reflexes. No cranial nerve deficit.  Skin: Skin is warm and dry.  Psychiatric: She has a normal mood and affect. Her behavior is normal. Judgment and thought content normal.   BP 100/70  Pulse 59  Temp(Src) 98.1 F (36.7 C) (Oral)  Ht _0  (1.6 m)  Wt 153 lb 6.4 oz (69.582 kg)  BMI 27.18 kg/m2        Assessment & Plan:  1. Peripheral  edema - CMP14+EGFR  2. Peripheral neuropathic pain - CMP14+EGFR - gabapentin (NEURONTIN) 300 MG capsule; Take 1 capsule (300 mg total) by mouth 3 (three) times daily.  Dispense: 90 capsule; Refill: 3  RTO in 1 week Smoking cessation discussed Discussed BP and falls risks   [name], FNP

## 2013-08-25 LAB — CMP14+EGFR
ALT: 16 IU/L (ref 0–32)
AST: 16 IU/L (ref 0–40)
Albumin/Globulin Ratio: 1.9 (ref 1.1–2.5)
Albumin: 4 g/dL (ref 3.5–5.5)
Alkaline Phosphatase: 80 IU/L (ref 39–117)
BUN/Creatinine Ratio: 10 (ref 9–23)
BUN: 7 mg/dL (ref 6–24)
CO2: 24 mmol/L (ref 18–29)
Calcium: 9.2 mg/dL (ref 8.7–10.2)
Chloride: 102 mmol/L (ref 97–108)
Creatinine, Ser: 0.69 mg/dL (ref 0.57–1.00)
GFR calc Af Amer: 116 mL/min/{1.73_m2} (ref >59)
GFR calc non Af Amer: 100 mL/min/{1.73_m2} (ref >59)
Globulin, Total: 2.1 g/dL (ref 1.5–4.5)
Glucose: 86 mg/dL (ref 65–99)
Potassium: 4.3 mmol/L (ref 3.5–5.2)
Sodium: 140 mmol/L (ref 134–144)
Total Bilirubin: 0.3 mg/dL (ref 0.0–1.2)
Total Protein: 6.1 g/dL (ref 6.0–8.5)

## 2013-09-22 ENCOUNTER — Ambulatory Visit (INDEPENDENT_AMBULATORY_CARE_PROVIDER_SITE_OTHER): Payer: Medicaid Other | Admitting: Family Medicine

## 2013-09-22 ENCOUNTER — Encounter: Payer: Self-pay | Admitting: Family Medicine

## 2013-09-22 ENCOUNTER — Emergency Department (HOSPITAL_COMMUNITY)
Admission: EM | Admit: 2013-09-22 | Discharge: 2013-09-22 | Disposition: A | Discharge status: Home / Self Care | Payer: Medicaid Other | Attending: Emergency Medicine | Admitting: Emergency Medicine

## 2013-09-22 ENCOUNTER — Encounter (HOSPITAL_COMMUNITY): Payer: Self-pay | Admitting: Emergency Medicine

## 2013-09-22 ENCOUNTER — Emergency Department (HOSPITAL_COMMUNITY): Payer: Medicaid Other

## 2013-09-22 VITALS — BP 108/66 | HR 56 | Temp 97.6°F | Ht 63.0 in | Wt 153.8 lb

## 2013-09-22 DIAGNOSIS — Z79899 Other long term (current) drug therapy: Secondary | ICD-10-CM | POA: Insufficient documentation

## 2013-09-22 DIAGNOSIS — F3289 Other specified depressive episodes: Secondary | ICD-10-CM | POA: Insufficient documentation

## 2013-09-22 DIAGNOSIS — Z862 Personal history of diseases of the blood and blood-forming organs and certain disorders involving the immune mechanism: Secondary | ICD-10-CM | POA: Insufficient documentation

## 2013-09-22 DIAGNOSIS — I498 Other specified cardiac arrhythmias: Secondary | ICD-10-CM | POA: Insufficient documentation

## 2013-09-22 DIAGNOSIS — F411 Generalized anxiety disorder: Secondary | ICD-10-CM | POA: Diagnosis not present

## 2013-09-22 DIAGNOSIS — F17200 Nicotine dependence, unspecified, uncomplicated: Secondary | ICD-10-CM | POA: Insufficient documentation

## 2013-09-22 DIAGNOSIS — R079 Chest pain, unspecified: Secondary | ICD-10-CM

## 2013-09-22 DIAGNOSIS — Z8619 Personal history of other infectious and parasitic diseases: Secondary | ICD-10-CM | POA: Diagnosis not present

## 2013-09-22 DIAGNOSIS — R001 Bradycardia, unspecified: Secondary | ICD-10-CM

## 2013-09-22 DIAGNOSIS — R110 Nausea: Secondary | ICD-10-CM | POA: Insufficient documentation

## 2013-09-22 DIAGNOSIS — R42 Dizziness and giddiness: Secondary | ICD-10-CM | POA: Insufficient documentation

## 2013-09-22 DIAGNOSIS — F329 Major depressive disorder, single episode, unspecified: Secondary | ICD-10-CM | POA: Diagnosis not present

## 2013-09-22 DIAGNOSIS — Z8639 Personal history of other endocrine, nutritional and metabolic disease: Secondary | ICD-10-CM | POA: Diagnosis not present

## 2013-09-22 DIAGNOSIS — Z7982 Long term (current) use of aspirin: Secondary | ICD-10-CM | POA: Diagnosis not present

## 2013-09-22 DIAGNOSIS — R55 Syncope and collapse: Secondary | ICD-10-CM | POA: Diagnosis not present

## 2013-09-22 DIAGNOSIS — R5383 Other fatigue: Secondary | ICD-10-CM

## 2013-09-22 DIAGNOSIS — R5381 Other malaise: Secondary | ICD-10-CM

## 2013-09-22 LAB — BASIC METABOLIC PANEL
Anion gap: 12 (ref 5–15)
BUN: 7 mg/dL (ref 6–23)
CO2: 24 mEq/L (ref 19–32)
Calcium: 9.1 mg/dL (ref 8.4–10.5)
Chloride: 108 mEq/L (ref 96–112)
Creatinine, Ser: 0.61 mg/dL (ref 0.50–1.10)
GFR calc Af Amer: >90 mL/min (ref >90)
GFR calc non Af Amer: >90 mL/min (ref >90)
Glucose, Bld: 92 mg/dL (ref 70–99)
Potassium: 4.1 mEq/L (ref 3.7–5.3)
Sodium: 144 mEq/L (ref 137–147)

## 2013-09-22 LAB — CBC WITH DIFFERENTIAL/PLATELET
Basophils Absolute: 0 10*3/uL (ref 0.0–0.1)
Basophils Relative: 0 % (ref 0–1)
Eosinophils Absolute: 0.1 10*3/uL (ref 0.0–0.7)
Eosinophils Relative: 1 % (ref 0–5)
HCT: 42.1 % (ref 36.0–46.0)
Hemoglobin: 14.1 g/dL (ref 12.0–15.0)
Lymphocytes Relative: 21 % (ref 12–46)
Lymphs Abs: 3.1 10*3/uL (ref 0.7–4.0)
MCH: 30.9 pg (ref 26.0–34.0)
MCHC: 33.5 g/dL (ref 30.0–36.0)
MCV: 92.3 fL (ref 78.0–100.0)
Monocytes Absolute: 0.6 10*3/uL (ref 0.1–1.0)
Monocytes Relative: 4 % (ref 3–12)
Neutro Abs: 10.7 10*3/uL — ABNORMAL HIGH (ref 1.7–7.7)
Neutrophils Relative %: 74 % (ref 43–77)
Platelets: 307 10*3/uL (ref 150–400)
RBC: 4.56 MIL/uL (ref 3.87–5.11)
RDW: 14 % (ref 11.5–15.5)
WBC: 14.5 10*3/uL — ABNORMAL HIGH (ref 4.0–10.5)

## 2013-09-22 LAB — POCT UA - MICROSCOPIC ONLY
Bacteria, U Microscopic: NEGATIVE
Casts, Ur, LPF, POC: NEGATIVE
Crystals, Ur, HPF, POC: NEGATIVE
Mucus, UA: NEGATIVE
WBC, Ur, HPF, POC: NEGATIVE
Yeast, UA: NEGATIVE

## 2013-09-22 LAB — POCT URINALYSIS DIPSTICK
Bilirubin, UA: NEGATIVE
Glucose, UA: NEGATIVE
Ketones, UA: NEGATIVE
Leukocytes, UA: NEGATIVE
Nitrite, UA: NEGATIVE
Protein, UA: NEGATIVE
Spec Grav, UA: 1.01
Urobilinogen, UA: NEGATIVE
pH, UA: 6

## 2013-09-22 LAB — URINE MICROSCOPIC-ADD ON

## 2013-09-22 LAB — URINALYSIS, ROUTINE W REFLEX MICROSCOPIC
Bilirubin Urine: NEGATIVE
Glucose, UA: NEGATIVE mg/dL
Ketones, ur: NEGATIVE mg/dL
Leukocytes, UA: NEGATIVE
Nitrite: NEGATIVE
Protein, ur: NEGATIVE mg/dL
Specific Gravity, Urine: 1.007 (ref 1.005–1.030)
Urobilinogen, UA: 0.2 mg/dL (ref 0.0–1.0)
pH: 6 (ref 5.0–8.0)

## 2013-09-22 LAB — I-STAT TROPONIN, ED: Troponin i, poc: 0.01 ng/mL (ref 0.00–0.08)

## 2013-09-22 MED ORDER — SODIUM CHLORIDE 0.9 % IV BOLUS (SEPSIS)
1000.0000 mL | Freq: Once | INTRAVENOUS | Status: AC
  Administered 2013-09-22: 1000 mL via INTRAVENOUS

## 2013-09-22 MED ORDER — ONDANSETRON 4 MG PO TBDP: 4.0000 mg | ORAL_TABLET | Freq: Three times a day (TID) | ORAL | Status: DC | PRN

## 2013-09-22 NOTE — Progress Notes (Signed)
   Subjective:    Patient ID: [name], female    DOB: [birth year], 53 y.o.   MRN: [id number]  HPI This 53 y.o. female presents for evaluation of dizziness, nausea, feels faint and feels like she is going To pass out.  She has been feeling bad for 4 days.  She states she is weak when standing up.  She states she has had chest pressure and pain in her chest on occasion.   .   Review of Systems C/o fatigue, nausea, dizziness No chest pain, SOB, HA, dizziness, vision change, N/V, diarrhea, constipation, dysuria, urinary urgency or frequency, myalgias, arthralgias or rash.     Objective:   Physical Exam  Vital signs noted  Female who looks older than stated age in NAD  HEENT - Head atraumatic Normocephalic Respiratory - Lungs CTA bilateral Cardiac - RRR S1 and S2 without murmur GI - Abdomen soft Nontender and bowel sounds active x 4 Extremities - No edema. Neuro - Grossly intact.  EKG - SB w/o acute ST-T changes.    Assessment & Plan:  Other malaise and fatigue - Plan: POCT UA - Microscopic Only, POCT urinalysis dipstick, POCT CBC, EKG 12-Lead  Pre syncope - Recommend she go to ED  Bradycardia - could be cause of weakness and recommend she go to ED  Chest pain - This happens when she walks and stands up and recommend she go to ED.  Lysbeth Penner FNP

## 2013-09-22 NOTE — ED Notes (Signed)
Pt placed on monitor upon arrival to room from radiology.Pt continues to be monitored by blood pressure, pulse ox, and 5 lead.

## 2013-09-22 NOTE — ED Provider Notes (Signed)
CSN: [id number]     Arrival date & time 09/22/13  1213 History   First MD Initiated Contact with Patient 09/22/13 1216     Chief Complaint  Patient presents with  . Chest Pain  . Dizziness     (Consider location/radiation/quality/duration/timing/severity/associated sxs/prior Treatment) HPI Comments: Patient presents today with a chief complaint of dizziness.  She describes the dizziness as feeling lightheaded.  She states that she has been feeling dizzy and nauseous for the past 4 days.  She denies vomiting, diarrhea, or abdominal pain.  She reports generalized weakness, but no focal weakness.  She states that she had chest pain a couple days ago, but no current chest pain.  She denies syncope.  Denies vision changes, numbness, tingling, headache, fever, or chills.  She was seen by her PCP for these symptoms earlier today and was sent to the ED for further evaluation.  She was also found to be bradycardic at the office of PCP.  Review of the chart shows that the patient has had bradycardia in the past.  She is currently not on any antihypertensives.  Patient is a 53 y.o. female presenting with chest pain and dizziness. The history is provided by the patient.  Chest Pain Associated symptoms: dizziness and nausea   Dizziness Associated symptoms: nausea     Past Medical History  Diagnosis Date  . Depression   . Hyperlipidemia   . Sciatica   . Back pain   . Right leg pain   . Hip pain   . HSV-1 infection   . Tobacco user   . Anxiety    Past Surgical History  Procedure Laterality Date  . Tubal ligation    . Cataract extraction    . Tonsillectomy    . Cesarean section    . Bunionectomy    . Elbow surgery    . Rhytidectomy neck / cheek / chin    . Novasure ablation    . Colonoscopy w/ polypectomy     Family History  Problem Relation Age of Onset  . Brain cancer Mother   . Cancer - Other Father     Abdominal   . Colon cancer Sister   . Colon cancer Paternal Grandmother   .  Colon cancer Sister    History  Substance Use Topics  . Smoking status: Current Every Day Smoker -- 1.00 packs/day    Types: Cigarettes    Start date: 01/23/1976  . Smokeless tobacco: Never Used     Comment: using the patch  . Alcohol Use: No   OB History   Grav Para Term Preterm Abortions TAB SAB Ect Mult Living   3 2 2  0 1 1 0 0 0 2     Review of Systems  Gastrointestinal: Positive for nausea.  Neurological: Positive for dizziness.  All other systems reviewed and are negative.     Allergies  Codeine and Meperidine hcl  Home Medications   Prior to Admission medications   Medication Sig Start Date End Date Taking? Authorizing Provider  albuterol (PROVENTIL HFA;VENTOLIN HFA) 108 (90 BASE) MCG/ACT inhaler Inhale 2 puffs into the lungs every 6 (six) hours as needed for wheezing or shortness of breath. 04/14/13  Yes Lysbeth Penner, FNP  ALPRAZolam Duanne Moron) 1 MG tablet Take 1 mg by mouth 2 (two) times daily as needed for anxiety.   Yes Historical Provider, MD  aspirin EC 81 MG tablet Take 81 mg by mouth daily as needed for mild pain or fever.  Yes Historical Provider, MD  gabapentin (NEURONTIN) 300 MG capsule Take 1 capsule (300 mg total) by mouth 3 (three) times daily. 08/24/13  Yes [name], FNP  HYDROcodone-acetaminophen (NORCO/VICODIN) 5-325 MG per tablet Take 2 tablets by mouth every 4 (four) hours as needed for moderate pain.   Yes Historical Provider, MD  omeprazole (PRILOSEC) 40 MG capsule Take 40 mg by mouth daily as needed (heartburn / indigestion).    Yes Historical Provider, MD   BP 110/64  Pulse 48  Temp(Src) 97.9 F (36.6 C) (Oral)  Resp 13  Ht 5\' 5"  (1.651 m)  Wt 153 lb (69.4 kg)  BMI 25.46 kg/m2  SpO2 100% Physical Exam  Nursing note and vitals reviewed. Constitutional: She appears well-developed and well-nourished.  HENT:  Head: Normocephalic and atraumatic.  Mouth/Throat: Oropharynx is clear and moist.  Eyes: EOM are normal. Pupils are equal,  round, and reactive to light.  Neck: Normal range of motion. Neck supple.  Cardiovascular: Normal rate, regular rhythm and normal heart sounds.   Pulmonary/Chest: Effort normal and breath sounds normal. No respiratory distress. She has no wheezes. She has no rales. She exhibits no tenderness.  Abdominal: Soft. Bowel sounds are normal. She exhibits no distension and no mass. There is no tenderness. There is no rebound and no guarding.  Musculoskeletal: Normal range of motion.  No LE edema  Neurological: She is alert. She has normal strength. No cranial nerve deficit or sensory deficit. Coordination and gait normal.  Normal gait, no ataxia Normal finger to nose testing Normal rapid alternating movements  Skin: Skin is warm and dry.    ED Course  Procedures (including critical care time) Labs Review Labs Reviewed  CBC WITH DIFFERENTIAL - Abnormal; Notable for the following:    WBC 14.5 (*)    Neutro Abs 10.7 (*)    All other components within normal limits  URINALYSIS, ROUTINE W REFLEX MICROSCOPIC - Abnormal; Notable for the following:    Hgb urine dipstick MODERATE (*)    All other components within normal limits  BASIC METABOLIC PANEL  URINE MICROSCOPIC-ADD ON  Randolm Idol, ED    Imaging Review Dg Chest 2 View  09/22/2013   CLINICAL DATA:  Shortness of breath and cough.  Smoker.  EXAM: CHEST  2 VIEW  COMPARISON:  PA and lateral chest 07/28/2007.  FINDINGS: Minimal linear atelectasis or scar is seen in the left lung base. The lungs are otherwise clear. Heart size is normal. There is no pneumothorax or pleural effusion.  IMPRESSION: No acute disease.   Electronically Signed   By: Inge Rise M.D.   On: 09/22/2013 13:13     EKG Interpretation   Date/Time:  Tuesday September 22 2013 12:20:44 EDT Ventricular Rate:  49 PR Interval:  153 QRS Duration: 96 QT Interval:  503 QTC Calculation: 454 R Axis:   95 Text Interpretation:  Sinus bradycardia Atrial premature complex   Borderline right axis deviation Low voltage, precordial leads No  significant change was found Confirmed by CAMPOS  MD, KEVIN ([id number]) on  09/22/2013 3:00:52 PM     3:00 PM Reassessed patient.  She reports that she is feeling much better at this time.  Normal gait, no ataxia.  She denies chest pain.   MDM   Final diagnoses:  None   Patient presents today with a chief complaint of nausea and dizziness that has been present for the past 4 days.  Patient denies chest pain and states that she has not had  any chest pain at all today.  Patient with a normal neurological exam.  No ataxia.  No vision changes.  No ischemic changes on EKG, but EKG does show Sinus Bradycardia.  Review of the chart shows that the patient has had Bradycardia in the past.  She is not on any Beta blockers or CCB.  Patient given referral to Cardiology.  Symptoms improved during ED course.  Feel that the patient is stable for discharge.  Patient discussed with Dr. Venora Maples who is in agreement with the plan.    Hyman Bible, PA-C 09/23/13 2210

## 2013-09-22 NOTE — ED Notes (Signed)
Pt placed into gown and on monitor upon arrival to room. Pt monitored by blood pressure, pulse ox, and 12 lead.  

## 2013-09-22 NOTE — Addendum Note (Signed)
Addended by: Earlene Plater on: 09/22/2013 02:49 PM   Modules accepted: Orders

## 2013-09-22 NOTE — ED Notes (Signed)
Pt continues to be monitored by blood pressure, pulse ox, and 5 lead.

## 2013-09-22 NOTE — ED Notes (Signed)
Pt to department via EMS- Pt reports that she went to her PCP office because she has been feeling bad. States that she had some chest pain while in the office. Sent here for further evaluation. Bp-130/78 HR-55 O2-98 18g lac.

## 2013-09-23 ENCOUNTER — Telehealth: Payer: Self-pay | Admitting: Family Medicine

## 2013-09-23 ENCOUNTER — Other Ambulatory Visit: Payer: Self-pay | Admitting: Family Medicine

## 2013-09-23 DIAGNOSIS — R0602 Shortness of breath: Secondary | ICD-10-CM

## 2013-09-23 NOTE — Telephone Encounter (Signed)
Cardiology referral ordered 

## 2013-09-24 NOTE — Telephone Encounter (Signed)
She was given #20 on 9/1. This should last through the weekend.  Spoke with patient and she is typically taking 2 a day. She is aware that this is a short term medication and to follow up at the beginning of the week if she continues to need it.

## 2013-09-27 NOTE — ED Provider Notes (Signed)
Medical screening examination/treatment/procedure(s) were performed by non-physician practitioner and as supervising physician I was immediately available for consultation/collaboration.   EKG Interpretation   Date/Time:  Tuesday September 22 2013 12:20:44 EDT Ventricular Rate:  49 PR Interval:  153 QRS Duration: 96 QT Interval:  503 QTC Calculation: 734 R Axis:   95 Text Interpretation:  Sinus bradycardia Atrial premature complex  Borderline right axis deviation Low voltage, precordial leads No  significant change was found Confirmed by Terril Amaro  MD, Lennette Bihari ([id number]) on  09/22/2013 3:00:52 PM        Hoy Morn, MD 09/27/13 [phone]

## 2013-10-08 ENCOUNTER — Other Ambulatory Visit: Payer: Self-pay | Admitting: Nurse Practitioner

## 2013-10-09 ENCOUNTER — Other Ambulatory Visit: Payer: Self-pay | Admitting: Nurse Practitioner

## 2013-10-09 NOTE — Telephone Encounter (Signed)
Patient last seen in office on 09-22-13. Rx last filled on 09-07-13 for #60. Please advise on refill. If approved please route to Pool A so nurse can phone in to pharmacy

## 2013-10-09 NOTE — Telephone Encounter (Signed)
Called in.

## 2013-10-11 NOTE — Telephone Encounter (Signed)
Please call in xanax with 1 refills 

## 2013-10-12 NOTE — Telephone Encounter (Signed)
Left authorization on voicemail 

## 2013-10-28 ENCOUNTER — Ambulatory Visit (INDEPENDENT_AMBULATORY_CARE_PROVIDER_SITE_OTHER): Payer: Medicaid Other | Admitting: Cardiovascular Disease

## 2013-10-28 ENCOUNTER — Encounter: Payer: Self-pay | Admitting: Cardiovascular Disease

## 2013-10-28 VITALS — BP 105/75 | HR 77 | Ht 63.0 in | Wt 152.8 lb

## 2013-10-28 DIAGNOSIS — R001 Bradycardia, unspecified: Secondary | ICD-10-CM

## 2013-10-28 DIAGNOSIS — R079 Chest pain, unspecified: Secondary | ICD-10-CM

## 2013-10-28 DIAGNOSIS — Z720 Tobacco use: Secondary | ICD-10-CM

## 2013-10-28 DIAGNOSIS — E785 Hyperlipidemia, unspecified: Secondary | ICD-10-CM

## 2013-10-28 DIAGNOSIS — R0600 Dyspnea, unspecified: Secondary | ICD-10-CM

## 2013-10-28 DIAGNOSIS — R0789 Other chest pain: Secondary | ICD-10-CM

## 2013-10-28 DIAGNOSIS — R42 Dizziness and giddiness: Secondary | ICD-10-CM

## 2013-10-28 LAB — T4, FREE: Free T4: 0.99 ng/dL (ref 0.60–1.60)

## 2013-10-28 LAB — TSH: TSH: 1 u[IU]/mL (ref 0.35–4.50)

## 2013-10-28 NOTE — Assessment & Plan Note (Signed)
Counseled for less than 10 minutes Continue ecig use but taper mg's  F/U primary

## 2013-10-28 NOTE — Assessment & Plan Note (Signed)
Cholesterol is at goal.  Continue current dose of statin and diet Rx.  No myalgias or side effects.  F/U  LFT's in 6 months. No results found for this basename: LDLCALC  Labs with primary            

## 2013-10-28 NOTE — Progress Notes (Signed)
Patient ID: [name], female   DOB: 21-Mar-1960, 53 y.o.   MRN: [id number]   53 yo referred by [facility] ER Seen there last month for dizzyness and bradycardia.  Has had slow HR for years. No frank syncope Had cough and URI ? Dehydrated when in ER  R/O telemetry with no heart block d/c home.  She is a smoker and trying e cig.  Was seen by me over 3 years aga for atypical chest pain and slow HR and had normal stress echo with no chronotropic incompetence.  She describes episodes of epigastric pain the radiate up to jaw and teeth hurt then she gets headache.  She drinks 8-9 pepsi's / day and has a poor diet.  She gets occasional exertional dyspnea  No cough sputum or fever.  No recent thyroid labs that I can see       ROS: Denies fever, malais, weight loss, blurry vision, decreased visual acuity, cough, sputum, SOB, hemoptysis, pleuritic pain, palpitaitons, heartburn, abdominal pain, melena, lower extremity edema, claudication, or rash.  All other systems reviewed and negative   General:  Not postural BP 105/80 standing  Affect appropriate Healthy:  appears stated age HEENT: normal Neck supple with no adenopathy JVP normal no bruits no thyromegaly Lungs clear with no wheezing and good diaphragmatic motion Heart:  S1/S2 no murmur,rub, gallop or click PMI normal Abdomen: benighn, BS positve, no tenderness, no AAA no bruit.  No HSM or HJR Distal pulses intact with no bruits No edema Neuro non-focal Skin warm and dry No muscular weakness  Medications Current Outpatient Prescriptions  Medication Sig Dispense Refill  . albuterol (PROVENTIL HFA;VENTOLIN HFA) 108 (90 BASE) MCG/ACT inhaler Inhale 2 puffs into the lungs every 6 (six) hours as needed for wheezing or shortness of breath.  1 Inhaler  0  . ALPRAZolam (XANAX) 1 MG tablet TAKE 1 TABLET BY MOUTH TWICE A DAY AS NEEDED  60 tablet  1  . aspirin EC 81 MG tablet Take 81 mg by mouth daily as needed for mild pain or fever.       Marland Kitchen  HYDROcodone-acetaminophen (NORCO/VICODIN) 5-325 MG per tablet Take 2 tablets by mouth every 4 (four) hours as needed for moderate pain.      Marland Kitchen omeprazole (PRILOSEC) 40 MG capsule Take 40 mg by mouth daily as needed (heartburn / indigestion).       . gabapentin (NEURONTIN) 300 MG capsule Take 1 capsule (300 mg total) by mouth 3 (three) times daily.  90 capsule  3  . ondansetron (ZOFRAN ODT) 4 MG disintegrating tablet Take 1 tablet (4 mg total) by mouth every 8 (eight) hours as needed for nausea or vomiting.  20 tablet  0   No current facility-administered medications for this visit.    Allergies Codeine and Meperidine hcl  Family History: Family History  Problem Relation Age of Onset  . Brain cancer Mother   . Cancer - Other Father     Abdominal   . Colon cancer Sister   . Colon cancer Paternal Grandmother   . Colon cancer Sister     Social History: History   Social History  . Marital Status: Divorced    Spouse Name: N/A    Number of Children: N/A  . Years of Education: N/A   Occupational History  . Not on file.   Social History Main Topics  . Smoking status: Current Every Day Smoker -- 1.00 packs/day    Types: Cigarettes    Start date:  01/23/1976  . Smokeless tobacco: Never Used     Comment: using the patch  . Alcohol Use: No  . Drug Use: No  . Sexual Activity: Yes   Other Topics Concern  . 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     Electrocardiogram:  SR low voltage no AV block   Assessment and Plan

## 2013-10-28 NOTE — Assessment & Plan Note (Signed)
Chronic no AV block  ETT given dizzyness and chest pain r/o abnormal HR response  Check TSH/T4 today

## 2013-10-28 NOTE — Patient Instructions (Addendum)
Your physician recommends that you schedule a follow-up appointment in: as NEEDED WITH  DR [facility]  Your physician recommends that you continue on your current medications as directed. Please refer to the Current Medication list given to you today.  Your physician has requested that you have a stress echocardiogram. For further information please visit [email or web address]. Please follow instruction sheet as given.  Your physician recommends that you return for lab work in:  TODAY   TSH AND  T4

## 2013-10-28 NOTE — Assessment & Plan Note (Signed)
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

## 2013-11-03 ENCOUNTER — Telehealth: Payer: Self-pay | Admitting: Cardiovascular Disease

## 2013-11-03 ENCOUNTER — Ambulatory Visit (HOSPITAL_COMMUNITY): Payer: Medicaid Other | Attending: Internal Medicine

## 2013-11-03 DIAGNOSIS — R0600 Dyspnea, unspecified: Secondary | ICD-10-CM

## 2013-11-03 DIAGNOSIS — R079 Chest pain, unspecified: Secondary | ICD-10-CM | POA: Insufficient documentation

## 2013-11-03 DIAGNOSIS — R42 Dizziness and giddiness: Secondary | ICD-10-CM

## 2013-11-03 NOTE — Telephone Encounter (Signed)
New message ° ° ° ° °Want test results °

## 2013-11-03 NOTE — Telephone Encounter (Signed)
PT  AWARE  STRESS ECHO HAS NOT BEEN REVIEWED  AS OF  YET  WILL  CALL  WITH  RESULTS  ONCE  MD READS  TEST

## 2013-11-03 NOTE — Progress Notes (Signed)
Stress echo completed 11/03/2013

## 2013-11-04 NOTE — Telephone Encounter (Signed)
Follow up     Pt want test results

## 2013-11-04 NOTE — Telephone Encounter (Signed)
Explained to patient that her ECHO has not been reviewed and we will call her as soon as MD reads.

## 2013-11-05 NOTE — Telephone Encounter (Signed)
Patient would like results of stress test today. Please call and advise.

## 2013-11-05 NOTE — Telephone Encounter (Signed)
Advised patient and she is agreeable to move forward with testing. Will send to scheduling and precert.

## 2013-11-05 NOTE — Telephone Encounter (Signed)
Message copied by Earvin Hansen on Thu Nov 05, 2013 10:16 AM ------      Message from: Josue Hector      Created: Wed Nov 04, 2013  7:08 PM       Echo images ok  HR response ok  BP dropped with exercise so still need to r/o CAD  See if we can get cardiac CT approved if not stress myovue ok ------

## 2013-11-06 ENCOUNTER — Encounter: Payer: Self-pay | Admitting: Cardiovascular Disease

## 2013-11-10 ENCOUNTER — Ambulatory Visit (HOSPITAL_COMMUNITY): Admission: RE | Admit: 2013-11-10 | Payer: Medicaid Other | Source: Ambulatory Visit

## 2013-11-13 ENCOUNTER — Ambulatory Visit (HOSPITAL_COMMUNITY): Payer: Medicaid Other

## 2013-11-16 ENCOUNTER — Ambulatory Visit (HOSPITAL_COMMUNITY)
Admission: RE | Admit: 2013-11-16 | Discharge: 2013-11-16 | Disposition: A | Discharge status: Home / Self Care | Payer: Medicaid Other | Source: Ambulatory Visit | Attending: Cardiovascular Disease | Admitting: Cardiovascular Disease

## 2013-11-16 DIAGNOSIS — R079 Chest pain, unspecified: Secondary | ICD-10-CM

## 2013-11-16 DIAGNOSIS — R911 Solitary pulmonary nodule: Secondary | ICD-10-CM | POA: Diagnosis not present

## 2013-11-16 DIAGNOSIS — I959 Hypotension, unspecified: Secondary | ICD-10-CM | POA: Insufficient documentation

## 2013-11-16 DIAGNOSIS — R0600 Dyspnea, unspecified: Secondary | ICD-10-CM

## 2013-11-16 MED ORDER — IOHEXOL 350 MG/ML SOLN
80.0000 mL | Freq: Once | INTRAVENOUS | Status: AC | PRN
  Administered 2013-11-16: 80 mL via INTRAVENOUS

## 2013-11-16 MED ORDER — NITROGLYCERIN 0.4 MG SL SUBL
SUBLINGUAL_TABLET | SUBLINGUAL | Status: AC
Start: 2013-11-16 — End: 2013-11-16
  Administered 2013-11-16: 0.4 mg
  Filled 2013-11-16: qty 2

## 2013-11-17 ENCOUNTER — Encounter: Payer: Self-pay | Admitting: Family Medicine

## 2013-11-17 ENCOUNTER — Ambulatory Visit (INDEPENDENT_AMBULATORY_CARE_PROVIDER_SITE_OTHER): Payer: Medicaid Other | Admitting: Family Medicine

## 2013-11-17 ENCOUNTER — Encounter (INDEPENDENT_AMBULATORY_CARE_PROVIDER_SITE_OTHER): Payer: Medicaid Other

## 2013-11-17 VITALS — BP 104/72 | HR 58 | Temp 97.6°F | Ht 63.0 in | Wt 157.0 lb

## 2013-11-17 DIAGNOSIS — K648 Other hemorrhoids: Secondary | ICD-10-CM

## 2013-11-17 MED ORDER — HYDROCORTISONE 2.5 % RE CREA: 1.0000 "application " | TOPICAL_CREAM | Freq: Two times a day (BID) | RECTAL | Status: DC

## 2013-11-17 NOTE — Progress Notes (Signed)
   Subjective:    Patient ID: [name], female    DOB: [birth year], 53 y.o.   MRN: [id number]  HPI This 53 y.o. female presents for evaluation of c/o hemorrhoids.   Review of Systems    No chest pain, SOB, HA, dizziness, vision change, N/V, diarrhea, constipation, dysuria, urinary urgency or frequency, myalgias, arthralgias or rash.  Objective:   Physical Exam  Vital signs noted  Well developed well nourished female.  HEENT - Head atraumatic Normocephalic                Eyes - PERRLA, Conjuctiva - clear Sclera- Clear EOMI                Ears - EAC's Wnl TM's Wnl Gross Hearing WNL                Nose - Nares patent                 Throat - oropharanx wnl Respiratory - Lungs CTA bilateral Cardiac - RRR S1 and S2 without murmur GI - Abdomen soft Nontender and bowel sounds active x 4 Extremities - No edema. Neuro - Grossly intact.      Assessment & Plan:  Other hemorrhoids - Plan: hydrocortisone (ANUSOL-HC) 2.5 % rectal cream  Lysbeth Penner FNP

## 2013-11-23 ENCOUNTER — Encounter: Payer: Self-pay | Admitting: Family Medicine

## 2013-11-24 ENCOUNTER — Telehealth: Payer: Self-pay | Admitting: *Deleted

## 2013-11-24 NOTE — Telephone Encounter (Signed)
PER  PT  DR Johnsie Cancel  CALLED WITH  CTA  RESULTS .Adonis Housekeeper

## 2013-12-07 ENCOUNTER — Other Ambulatory Visit: Payer: Self-pay | Admitting: Family Medicine

## 2013-12-07 NOTE — Telephone Encounter (Signed)
Last filled 11/08/13, last seen 11/17/13. Call into [facility] if approved

## 2013-12-07 NOTE — Telephone Encounter (Signed)
Last seen 11/17/13  B  [place]  If approved route to nurse to call into [facility] 

## 2013-12-08 NOTE — Telephone Encounter (Signed)
RX approved by [place] into [facility]

## 2014-02-01 ENCOUNTER — Other Ambulatory Visit: Payer: Self-pay | Admitting: Family Medicine

## 2014-02-02 NOTE — Telephone Encounter (Signed)
Last seen 11/17/13  B  [place]  If approved route to nurse to call into [facility]

## 2014-02-03 NOTE — Telephone Encounter (Signed)
Please review

## 2014-02-04 ENCOUNTER — Other Ambulatory Visit: Payer: Self-pay | Admitting: Family Medicine

## 2014-02-04 ENCOUNTER — Telehealth: Payer: Self-pay | Admitting: Family Medicine

## 2014-02-04 NOTE — Telephone Encounter (Signed)
rx called into pharmacy

## 2014-03-02 ENCOUNTER — Other Ambulatory Visit: Payer: Self-pay | Admitting: Family Medicine

## 2014-03-22 ENCOUNTER — Encounter: Payer: Self-pay | Admitting: Family

## 2014-03-22 ENCOUNTER — Ambulatory Visit (INDEPENDENT_AMBULATORY_CARE_PROVIDER_SITE_OTHER): Payer: Medicaid Other | Admitting: Family

## 2014-03-22 VITALS — BP 111/72 | HR 72 | Temp 97.6°F | Ht 63.0 in | Wt 156.0 lb

## 2014-03-22 DIAGNOSIS — B9689 Other specified bacterial agents as the cause of diseases classified elsewhere: Secondary | ICD-10-CM

## 2014-03-22 DIAGNOSIS — N760 Acute vaginitis: Secondary | ICD-10-CM | POA: Diagnosis not present

## 2014-03-22 DIAGNOSIS — S161XXA Strain of muscle, fascia and tendon at neck level, initial encounter: Secondary | ICD-10-CM

## 2014-03-22 DIAGNOSIS — L298 Other pruritus: Secondary | ICD-10-CM

## 2014-03-22 DIAGNOSIS — A499 Bacterial infection, unspecified: Secondary | ICD-10-CM

## 2014-03-22 DIAGNOSIS — S39012A Strain of muscle, fascia and tendon of lower back, initial encounter: Secondary | ICD-10-CM | POA: Diagnosis not present

## 2014-03-22 DIAGNOSIS — N898 Other specified noninflammatory disorders of vagina: Secondary | ICD-10-CM

## 2014-03-22 LAB — POCT URINALYSIS DIPSTICK
Bilirubin, UA: NEGATIVE
Blood, UA: NEGATIVE
Glucose, UA: NEGATIVE
Ketones, UA: NEGATIVE
Nitrite, UA: NEGATIVE
Protein, UA: NEGATIVE
Spec Grav, UA: 1.01
Urobilinogen, UA: NEGATIVE
pH, UA: 6

## 2014-03-22 LAB — POCT WET PREP (WET MOUNT): KOH Wet Prep POC: POSITIVE

## 2014-03-22 LAB — POCT UA - MICROSCOPIC ONLY
Bacteria, U Microscopic: NEGATIVE
Casts, Ur, LPF, POC: NEGATIVE
Crystals, Ur, HPF, POC: NEGATIVE
Mucus, UA: NEGATIVE
Yeast, UA: NEGATIVE

## 2014-03-22 MED ORDER — CYCLOBENZAPRINE HCL 10 MG PO TABS: 10.0000 mg | ORAL_TABLET | Freq: Three times a day (TID) | ORAL | Status: DC | PRN

## 2014-03-22 MED ORDER — METRONIDAZOLE 500 MG PO TABS: 500.0000 mg | ORAL_TABLET | Freq: Two times a day (BID) | ORAL | Status: DC

## 2014-03-22 MED ORDER — METHYLPREDNISOLONE (PAK) 4 MG PO TABS: ORAL_TABLET | ORAL | Status: DC

## 2014-03-22 MED ORDER — MELOXICAM 15 MG PO TABS: 15.0000 mg | ORAL_TABLET | Freq: Every day | ORAL | Status: DC

## 2014-03-22 NOTE — Patient Instructions (Signed)
Muscle Strain A muscle strain is an injury that occurs when a muscle is stretched beyond its normal length. Usually a small number of muscle fibers are torn when this happens. Muscle strain is rated in degrees. First-degree strains have the least amount of muscle fiber tearing and pain. Second-degree and third-degree strains have increasingly more tearing and pain.  Usually, recovery from muscle strain takes 1-2 weeks. Complete healing takes 5-6 weeks.  CAUSES  Muscle strain happens when a sudden, violent force placed on a muscle stretches it too far. This may occur with lifting, sports, or a fall.  RISK FACTORS Muscle strain is especially common in athletes.  SIGNS AND SYMPTOMS At the site of the muscle strain, there may be:  Pain.  Bruising.  Swelling.  Difficulty using the muscle due to pain or lack of normal function. DIAGNOSIS  Your health care provider will perform a physical exam and ask about your medical history. TREATMENT  Often, the best treatment for a muscle strain is resting, icing, and applying cold compresses to the injured area.  HOME CARE INSTRUCTIONS   Use the PRICE method of treatment to promote muscle healing during the first 2-3 days after your injury. The PRICE method involves:  Protecting the muscle from being injured again.  Restricting your activity and resting the injured body part.  Icing your injury. To do this, put ice in a plastic bag. Place a towel between your skin and the bag. Then, apply the ice and leave it on from 15-20 minutes each hour. After the third day, switch to moist heat packs.  Apply compression to the injured area with a splint or elastic bandage. Be careful not to wrap it too tightly. This may interfere with blood circulation or increase swelling.  Elevate the injured body part above the level of your heart as often as you can.  Only take over-the-counter or prescription medicines for pain, discomfort, or fever as directed by your  health care provider.  Warming up prior to exercise helps to prevent future muscle strains. SEEK MEDICAL CARE IF:   You have increasing pain or swelling in the injured area.  You have numbness, tingling, or a significant loss of strength in the injured area. MAKE SURE YOU:   Understand these instructions.  Will watch your condition.  Will get help right away if you are not doing well or get worse. Document Released: 01/08/2005 Document Revised: 10/29/2012 Document Reviewed: 08/07/2012 [facility] Patient Information 2015 [place], [place]. This information is not intended to replace advice given to you by your health care provider. Make sure you discuss any questions you have with your health care provider. Bacterial Vaginosis Bacterial vaginosis is a vaginal infection that occurs when the normal balance of bacteria in the vagina is disrupted. It results from an overgrowth of certain bacteria. This is the most common vaginal infection in women of childbearing age. Treatment is important to prevent complications, especially in pregnant women, as it can cause a premature delivery. CAUSES  Bacterial vaginosis is caused by an increase in harmful bacteria that are normally present in smaller amounts in the vagina. Several different kinds of bacteria can cause bacterial vaginosis. However, the reason that the condition develops is not fully understood. RISK FACTORS Certain activities or behaviors can put you at an increased risk of developing bacterial vaginosis, including:  Having a new sex partner or multiple sex partners.  Douching.  Using an intrauterine device (IUD) for contraception. Women do not get bacterial vaginosis from toilet  seats, bedding, swimming pools, or contact with objects around them. SIGNS AND SYMPTOMS  Some women with bacterial vaginosis have no signs or symptoms. Common symptoms include:  Grey vaginal discharge.  A fishlike odor with discharge, especially after sexual  intercourse.  Itching or burning of the vagina and vulva.  Burning or pain with urination. DIAGNOSIS  Your health care provider will take a medical history and examine the vagina for signs of bacterial vaginosis. A sample of vaginal fluid may be taken. Your health care provider will look at this sample under a microscope to check for bacteria and abnormal cells. A vaginal pH test may also be done.  TREATMENT  Bacterial vaginosis may be treated with antibiotic medicines. These may be given in the form of a pill or a vaginal cream. A second round of antibiotics may be prescribed if the condition comes back after treatment.  HOME CARE INSTRUCTIONS   Only take over-the-counter or prescription medicines as directed by your health care provider.  If antibiotic medicine was prescribed, take it as directed. Make sure you finish it even if you start to feel better.  Do not have sex until treatment is completed.  Tell all sexual partners that you have a vaginal infection. They should see their health care provider and be treated if they have problems, such as a mild rash or itching.  Practice safe sex by using condoms and only having one sex partner. SEEK MEDICAL CARE IF:   Your symptoms are not improving after 3 days of treatment.  You have increased discharge or pain.  You have a fever. MAKE SURE YOU:   Understand these instructions.  Will watch your condition.  Will get help right away if you are not doing well or get worse. FOR MORE INFORMATION  Centers for Disease Control and Prevention, Division of STD Prevention: [employer] American Sexual Health Association (ASHA): www.ashastd.org  Document Released: 01/08/2005 Document Revised: 10/29/2012 Document Reviewed: 08/20/2012 [facility] Patient Information 2015 [place], [place]. This information is not intended to replace advice given to you by your health care provider. Make sure you discuss any questions you have with your health  care provider.

## 2014-03-22 NOTE — Progress Notes (Signed)
Subjective:    Patient ID: [name], female    DOB: 1960/07/31, 54 y.o.   MRN: [id number]  Vaginal Itching The patient's primary symptoms include genital itching. The patient's pertinent negatives include no vaginal discharge. This is a new problem. The current episode started in the past 7 days. The problem occurs constantly. The problem has been unchanged. The patient is experiencing no pain. Associated symptoms include back pain and dysuria. Pertinent negatives include no headaches. She has tried NSAIDs for the symptoms. The treatment provided mild relief.  Neck Pain  This is a new problem. The current episode started in the past 7 days. The problem occurs constantly. The problem has been unchanged. The pain is associated with a twisting injury. The quality of the pain is described as burning and shooting. The pain is at a severity of 6/10. Associated symptoms include leg pain and numbness. Pertinent negatives include no headaches. She has tried bed rest, ice and NSAIDs for the symptoms. The treatment provided mild relief.  Back Pain This is a new problem. The current episode started in the past 7 days. The problem occurs constantly. The problem is unchanged. The pain is present in the lumbar spine and thoracic spine. The quality of the pain is described as stabbing and burning. The pain is at a severity of 6/10. The pain is mild. The symptoms are aggravated by lying down and standing. Associated symptoms include dysuria, leg pain and numbness. Pertinent negatives include no bladder incontinence, bowel incontinence or headaches. She has tried bed rest, NSAIDs and ice for the symptoms. The treatment provided mild relief.      Review of Systems  Constitutional: Negative.   HENT: Negative.   Eyes: Negative.   Respiratory: Negative.  Negative for shortness of breath.   Cardiovascular: Negative.  Negative for palpitations.  Gastrointestinal: Negative.  Negative for bowel incontinence.    Endocrine: Negative.   Genitourinary: Positive for dysuria. Negative for bladder incontinence and vaginal discharge.  Musculoskeletal: Positive for back pain and neck pain.  Neurological: Positive for numbness. Negative for headaches.  Hematological: Negative.   Psychiatric/Behavioral: Negative.   All other systems reviewed and are negative.      Objective:   Physical Exam  Constitutional: She is oriented to person, place, and time. She appears well-developed and well-nourished. No distress.  HENT:  Head: Normocephalic and atraumatic.  Right Ear: External ear normal.  Left Ear: External ear normal.  Nose: Nose normal.  Mouth/Throat: Oropharynx is clear and moist.  Eyes: Pupils are equal, round, and reactive to light.  Neck: Normal range of motion. Neck supple. No thyromegaly present.  Cardiovascular: Normal rate, regular rhythm, normal heart sounds and intact distal pulses.   No murmur heard. Pulmonary/Chest: Effort normal and breath sounds normal. No respiratory distress. She has no wheezes.  Abdominal: Soft. Bowel sounds are normal. She exhibits no distension. There is no tenderness.  Musculoskeletal: Normal range of motion. She exhibits no edema or tenderness.  Neurological: She is alert and oriented to person, place, and time. She has normal reflexes. No cranial nerve deficit.  Skin: Skin is warm and dry.  Psychiatric: She has a normal mood and affect. Her behavior is normal. Judgment and thought content normal.  Vitals reviewed.   BP 111/72 mmHg  Pulse 72  Temp(Src) 97.6 F (36.4 C) (Oral)  Ht 5' 3"  (1.6 m)  Wt 156 lb (70.761 kg)  BMI 27.64 kg/m2       Assessment & Plan:  1.  Vaginal itching - POCT Wet Prep [facility])  2. Neck strain, initial encounter - cyclobenzaprine (FLEXERIL) 10 MG tablet; Take 1 tablet (10 mg total) by mouth 3 (three) times daily as needed for muscle spasms.  Dispense: 30 tablet; Refill: 0 - meloxicam (MOBIC) 15 MG tablet; Take 1 tablet  (15 mg total) by mouth daily.  Dispense: 30 tablet; Refill: 0 - methylPREDNIsolone (MEDROL DOSPACK) 4 MG tablet; follow package directions  Dispense: 21 tablet; Refill: 0 - BMP8+EGFR  3. Back strain, initial encounter -Rest Ice and heat as needed - cyclobenzaprine (FLEXERIL) 10 MG tablet; Take 1 tablet (10 mg total) by mouth 3 (three) times daily as needed for muscle spasms.  Dispense: 30 tablet; Refill: 0 - meloxicam (MOBIC) 15 MG tablet; Take 1 tablet (15 mg total) by mouth daily.  Dispense: 30 tablet; Refill: 0 - methylPREDNIsolone (MEDROL DOSPACK) 4 MG tablet; follow package directions  Dispense: 21 tablet; Refill: 0 - BMP8+EGFR  4. BV (bacterial vaginosis) -Keep clean and dry - metroNIDAZOLE (FLAGYL) 500 MG tablet; Take 1 tablet (500 mg total) by mouth 2 (two) times daily.  Dispense: 14 tablet; Refill: 0   Labs pending Diet and exercise encouraged RTO prn  [name], FNP

## 2014-03-23 LAB — BMP8+EGFR
BUN/Creatinine Ratio: 10 (ref 9–23)
BUN: 7 mg/dL (ref 6–24)
CO2: 23 mmol/L (ref 18–29)
Calcium: 9.5 mg/dL (ref 8.7–10.2)
Chloride: 102 mmol/L (ref 97–108)
Creatinine, Ser: 0.73 mg/dL (ref 0.57–1.00)
GFR calc Af Amer: 110 mL/min/{1.73_m2} (ref >59)
GFR calc non Af Amer: 95 mL/min/{1.73_m2} (ref >59)
Glucose: 80 mg/dL (ref 65–99)
Potassium: 4.3 mmol/L (ref 3.5–5.2)
Sodium: 139 mmol/L (ref 134–144)

## 2014-03-24 ENCOUNTER — Telehealth: Payer: Self-pay | Admitting: *Deleted

## 2014-03-24 NOTE — Telephone Encounter (Signed)
-----   Message from [name], Bieber sent at 03/23/2014  8:57 AM EST ----- Kidney function stable

## 2014-03-24 NOTE — Telephone Encounter (Signed)
Pt notified of results Verbalizes understanding 

## 2014-03-30 ENCOUNTER — Other Ambulatory Visit: Payer: Self-pay | Admitting: Family Medicine

## 2014-03-30 NOTE — Telephone Encounter (Signed)
Last filled 03/05/14, last seen 03/22/14. Route to pool A, nurse call into [facility]

## 2014-03-31 NOTE — Telephone Encounter (Signed)
Called [facility] with [facility] refill okayed per [facility]

## 2014-05-10 ENCOUNTER — Telehealth: Payer: Self-pay | Admitting: Family

## 2014-05-10 NOTE — Telephone Encounter (Signed)
Stp who is c/o vaginal itching and burning from vagina all the way back to her rectum. Pt states when she urinates and the urine hits the skin it sets her on fire. Advised pt she ntbs and evaluated, pt ntbs this afternoon as she is going out of town at 5 am in the morning. Pt advised we don't have any openings and advised to go to Urgent Care.Pt voiced understanding.

## 2014-05-26 ENCOUNTER — Other Ambulatory Visit: Payer: Self-pay | Admitting: Family Medicine

## 2014-07-01 ENCOUNTER — Telehealth: Payer: Self-pay | Admitting: Family

## 2014-07-01 ENCOUNTER — Other Ambulatory Visit: Payer: Self-pay | Admitting: Family

## 2014-07-01 NOTE — Telephone Encounter (Signed)
Xanax called in  to [facility].

## 2014-07-01 NOTE — Telephone Encounter (Signed)
Last filled 05/30/14, last seen 03/22/14. Route to pool, call into [facility]

## 2014-07-28 ENCOUNTER — Other Ambulatory Visit: Payer: Self-pay | Admitting: Family

## 2014-07-30 ENCOUNTER — Other Ambulatory Visit: Payer: Self-pay | Admitting: Family

## 2014-08-03 ENCOUNTER — Ambulatory Visit (INDEPENDENT_AMBULATORY_CARE_PROVIDER_SITE_OTHER): Payer: Medicaid Other | Admitting: Physician Assistant

## 2014-08-03 ENCOUNTER — Encounter: Payer: Self-pay | Admitting: Physician Assistant

## 2014-08-03 VITALS — BP 103/73 | HR 64 | Temp 97.6°F | Ht 63.0 in | Wt 155.0 lb

## 2014-08-03 DIAGNOSIS — L309 Dermatitis, unspecified: Secondary | ICD-10-CM | POA: Diagnosis not present

## 2014-08-03 DIAGNOSIS — L089 Local infection of the skin and subcutaneous tissue, unspecified: Secondary | ICD-10-CM | POA: Diagnosis not present

## 2014-08-03 DIAGNOSIS — B86 Scabies: Secondary | ICD-10-CM | POA: Diagnosis not present

## 2014-08-03 MED ORDER — HYDROXYZINE HCL 25 MG PO TABS: 25.0000 mg | ORAL_TABLET | Freq: Three times a day (TID) | ORAL | Status: DC

## 2014-08-03 MED ORDER — TRIAMCINOLONE ACETONIDE 0.1 % EX CREA: 1.0000 "application " | TOPICAL_CREAM | Freq: Two times a day (BID) | CUTANEOUS | Status: DC

## 2014-08-03 MED ORDER — SULFAMETHOXAZOLE-TRIMETHOPRIM 800-160 MG PO TABS: 1.0000 | ORAL_TABLET | Freq: Two times a day (BID) | ORAL | Status: DC

## 2014-08-03 MED ORDER — PERMETHRIN 5 % EX CREA: 1.0000 "application " | TOPICAL_CREAM | Freq: Once | CUTANEOUS | Status: DC

## 2014-08-03 NOTE — Patient Instructions (Signed)

## 2014-08-03 NOTE — Progress Notes (Signed)
   Subjective:    Patient ID: [name], female    DOB: 1960/01/25, 54 y.o.   MRN: [id number]  HPI 54 y/o female presents with c/o bumps, rash, itching x 4 days ago. States that it feels like "really sharp fine needles all over her body". No contacts with similar symptoms. She states that she picks at the lesions and "digs the bugs out of her skin". She has 3 vials today with oil and water that have "mites" in them. She states that she recently went to [place], where there are "flesh eating mites" in the water but she doesn't think she got this from there. Her boyfriend is a Administrator, no symptoms. Her two sons live with her,(20's) no symptoms. Inside dog that is treated for fleas monthly     Review of Systems  Constitutional: Negative.   HENT: Negative.   Eyes: Negative.   Respiratory: Negative.   Cardiovascular: Negative.   Gastrointestinal: Negative.   Endocrine: Negative.   Genitourinary: Negative.   Musculoskeletal: Negative.   Skin: Positive for rash.       Rash all over her body with sores on her bottom from scratching       Objective:   Physical Exam  Constitutional: She appears well-developed and well-nourished. No distress.  Pulmonary/Chest: Effort normal.  Skin: She is not diaphoretic.  Areas of PIH on buttocks from old lesions. Excoriations on buttocks and abdomen suspicious for secondary infection. Excoriations on BUE from patient "picking"   Nursing note and vitals reviewed.         Assessment & Plan:  1. Scabies  - hydrOXYzine (ATARAX/VISTARIL) 25 MG tablet; Take 1 tablet (25 mg total) by mouth 3 (three) times daily.  Dispense: 60 tablet; Refill: 1 - permethrin (ELIMITE) 5 % cream; Apply 1 application topically once. Apply  To entire body. Leave on overnight. Rinse off in morning  Dispense: 60 g; Refill: 1 - triamcinolone cream (KENALOG) 0.1 %; Apply 1 application topically 2 (two) times daily.  Dispense: 453 g; Refill: 0  2. Dermatitis  - hydrOXYzine  (ATARAX/VISTARIL) 25 MG tablet; Take 1 tablet (25 mg total) by mouth 3 (three) times daily.  Dispense: 60 tablet; Refill: 1 - permethrin (ELIMITE) 5 % cream; Apply 1 application topically once. Apply  To entire body. Leave on overnight. Rinse off in morning  Dispense: 60 g; Refill: 1 - triamcinolone cream (KENALOG) 0.1 %; Apply 1 application topically 2 (two) times daily.  Dispense: 453 g; Refill: 0  3. Skin infection  - sulfamethoxazole-trimethoprim (BACTRIM DS,SEPTRA DS) 800-160 MG per tablet; Take 1 tablet by mouth 2 (two) times daily.  Dispense: 28 tablet; Refill: 0 - Wound culture   Continue all meds Labs pending Health Maintenance reviewed Diet and exercise encouraged RTO 2 weeks   Tiffany A. Benjamin Stain PA-C

## 2014-08-05 ENCOUNTER — Ambulatory Visit: Payer: Medicaid Other | Admitting: Physician Assistant

## 2014-08-05 ENCOUNTER — Encounter: Payer: Self-pay | Admitting: Physician Assistant

## 2014-08-05 ENCOUNTER — Ambulatory Visit (INDEPENDENT_AMBULATORY_CARE_PROVIDER_SITE_OTHER): Payer: Medicaid Other | Admitting: Physician Assistant

## 2014-08-05 VITALS — BP 107/70 | HR 65 | Temp 98.2°F | Ht 63.0 in | Wt 155.2 lb

## 2014-08-05 DIAGNOSIS — T887XXA Unspecified adverse effect of drug or medicament, initial encounter: Secondary | ICD-10-CM

## 2014-08-05 DIAGNOSIS — T50905A Adverse effect of unspecified drugs, medicaments and biological substances, initial encounter: Secondary | ICD-10-CM

## 2014-08-05 NOTE — Patient Instructions (Signed)

## 2014-08-05 NOTE — Progress Notes (Signed)
Subjective:     Patient ID: [name], female   DOB: 05-Sep-1960, 54 y.o.   MRN: [id number]  HPI Pt here due to possible reaction to medicine Recently seen for possible scabies Placed on topical meds and also given rx for Septra for possible secondary infection Since starting Septra she has had joint pain and scratchy throat Denies any SOB or trouble breathing Last does last pm  Review of Systems  Constitutional: Negative.   Respiratory: Negative.   Cardiovascular: Negative.   Musculoskeletal: Positive for myalgias and arthralgias.       Objective:   Physical Exam  HENT:  Mouth/Throat: Oropharynx is clear and moist.  No edema to airway noted  Cardiovascular: Normal rate, regular rhythm and normal heart sounds.   Pulmonary/Chest: Effort normal and breath sounds normal. No respiratory distress. She has no wheezes.  Skin: Skin is warm.  No signs of secondary infection to lesions  Nursing note and vitals reviewed.      Assessment:     Medicine reaction Scabies    Plan:     Hold further Septra DS at this point Added to her possible reactions in chart OTC antihist for sx F/U prn

## 2014-08-09 LAB — WOUND CULTURE: Organism ID, Bacteria: NONE SEEN

## 2014-08-12 ENCOUNTER — Other Ambulatory Visit: Payer: Self-pay | Admitting: Physician Assistant

## 2014-08-12 ENCOUNTER — Telehealth: Payer: Self-pay | Admitting: Physician Assistant

## 2014-08-12 DIAGNOSIS — A498 Other bacterial infections of unspecified site: Secondary | ICD-10-CM

## 2014-08-12 DIAGNOSIS — Z22322 Carrier or suspected carrier of Methicillin resistant Staphylococcus aureus: Secondary | ICD-10-CM

## 2014-08-12 MED ORDER — CIPROFLOXACIN HCL 500 MG PO TABS: 500.0000 mg | ORAL_TABLET | Freq: Two times a day (BID) | ORAL | Status: DC

## 2014-08-12 NOTE — Telephone Encounter (Signed)
Have talked to patient via phone call. She needs to start antibiotic sent to pharmacy, have house and dog treated for fleas. Change to [facility] soap, take atarax and use TAC as directed. I will also leave paper up front with psychiatry info due to her increased anxiety, as I also feel this is a psychological illness in combination with her skin infection.  [place] Whedbee A. Benjamin Stain PA-C

## 2014-08-17 NOTE — Telephone Encounter (Signed)
[name] talked to patient yesterday . She does not need the elimite. Julie Granderson A. Benjamin Stain PA-C

## 2014-08-23 ENCOUNTER — Ambulatory Visit (INDEPENDENT_AMBULATORY_CARE_PROVIDER_SITE_OTHER): Payer: Medicaid Other | Admitting: Physician Assistant

## 2014-08-23 ENCOUNTER — Encounter: Payer: Self-pay | Admitting: Physician Assistant

## 2014-08-23 VITALS — BP 114/65 | HR 55 | Temp 96.9°F | Ht 63.0 in | Wt 157.0 lb

## 2014-08-23 DIAGNOSIS — L309 Dermatitis, unspecified: Secondary | ICD-10-CM

## 2014-08-23 DIAGNOSIS — A498 Other bacterial infections of unspecified site: Secondary | ICD-10-CM

## 2014-08-23 DIAGNOSIS — Z22322 Carrier or suspected carrier of Methicillin resistant Staphylococcus aureus: Secondary | ICD-10-CM | POA: Diagnosis not present

## 2014-08-23 DIAGNOSIS — B86 Scabies: Secondary | ICD-10-CM | POA: Diagnosis not present

## 2014-08-23 MED ORDER — CIPROFLOXACIN HCL 500 MG PO TABS: 500.0000 mg | ORAL_TABLET | Freq: Two times a day (BID) | ORAL | Status: DC

## 2014-08-23 MED ORDER — TRIAMCINOLONE ACETONIDE 0.1 % EX CREA: 1.0000 "application " | TOPICAL_CREAM | Freq: Two times a day (BID) | CUTANEOUS | Status: DC

## 2014-08-23 NOTE — Patient Instructions (Signed)
Eczema Eczema, also called atopic dermatitis, is a skin disorder that causes inflammation of the skin. It causes a red rash and dry, scaly skin. The skin becomes very itchy. Eczema is generally worse during the cooler winter months and often improves with the warmth of summer. Eczema usually starts showing signs in infancy. Some children outgrow eczema, but it may last through adulthood.  CAUSES  The exact cause of eczema is not known, but it appears to run in families. People with eczema often have a family history of eczema, allergies, asthma, or hay fever. Eczema is not contagious. Flare-ups of the condition may be caused by:   Contact with something you are sensitive or allergic to.   Stress. SIGNS AND SYMPTOMS  Dry, scaly skin.   Red, itchy rash.   Itchiness. This may occur before the skin rash and may be very intense.  DIAGNOSIS  The diagnosis of eczema is usually made based on symptoms and medical history. TREATMENT  Eczema cannot be cured, but symptoms usually can be controlled with treatment and other strategies. A treatment plan might include:  Controlling the itching and scratching.   Use over-the-counter antihistamines as directed for itching. This is especially useful at night when the itching tends to be worse.   Use over-the-counter steroid creams as directed for itching.   Avoid scratching. Scratching makes the rash and itching worse. It may also result in a skin infection (impetigo) due to a break in the skin caused by scratching.   Keeping the skin well moisturized with creams every day. This will seal in moisture and help prevent dryness. Lotions that contain alcohol and water should be avoided because they can dry the skin.   Limiting exposure to things that you are sensitive or allergic to (allergens).   Recognizing situations that cause stress.   Developing a plan to manage stress.  HOME CARE INSTRUCTIONS   Only take over-the-counter or  prescription medicines as directed by your health care provider.   Do not use anything on the skin without checking with your health care provider.   Keep baths or showers short (5 minutes) in warm (not hot) water. Use mild cleansers for bathing. These should be unscented. You may add nonperfumed bath oil to the bath water. It is best to avoid soap and bubble bath.   Immediately after a bath or shower, when the skin is still damp, apply a moisturizing ointment to the entire body. This ointment should be a petroleum ointment. This will seal in moisture and help prevent dryness. The thicker the ointment, the better. These should be unscented.   Keep fingernails cut short. Children with eczema may need to wear soft gloves or mittens at night after applying an ointment.   Dress in clothes made of cotton or cotton blends. Dress lightly, because heat increases itching.   A child with eczema should stay away from anyone with fever blisters or cold sores. The virus that causes fever blisters (herpes simplex) can cause a serious skin infection in children with eczema. SEEK MEDICAL CARE IF:   Your itching interferes with sleep.   Your rash gets worse or is not better within 1 week after starting treatment.   You see pus or soft yellow scabs in the rash area.   You have a fever.   You have a rash flare-up after contact with someone who has fever blisters.  Document Released: 01/06/2000 Document Revised: 10/29/2012 Document Reviewed: 08/11/2012 [employer] Patient Information 2015 [employer]. This information   is not intended to replace advice given to you by your health care provider. Make sure you discuss any questions you have with your health care provider. MRSA FAQs What is MRSA? Staphylococcus aureus (pronounced staff-ill-oh-KOK-us AW-ree-us), or "Staph" is a very common germ that about 1 out of every 3 people have on their skin or in their nose. This germ does not cause any  problems for most people who have it on their skin. But sometimes it can cause serious infections such as skin or wound infections, pneumonia, or infections of the blood.  Antibiotics are given to kill Staph germs when they cause infections. Some Staph are resistant, meaning they cannot be killed by some antibiotics. "Methicillin-resistant Staphylococcus aureus" or "MRSA" is a type of Staph that is resistant to some of the antibiotics that are often used to treat Staph infections. Who is most likely to get an MRSA infection? In the hospital, people who are more likely to get an MRSA infection are people who:  have other health conditions making them sick  have been in the hospital or a nursing home  have been treated with antibiotics. People who are healthy and who have not been in the hospital or a nursing home can also get MRSA infections. These infections usually involve the skin. More information about this type of MRSA infection, known as "community-associated MRSA" infection, is available from the Centers for Disease Control and Prevention (CDC). http://rios.biz/ How do I get an MRSA infection? People who have MRSA germs on their skin or who are infected with MRSA may be able to spread the germ to other people. MRSA can be passed on to bed linens, bed rails, bathroom fixtures, and medical equipment. It can spread to other people on contaminated equipment and on the hands of doctors, nurses, other healthcare providers and visitors. Can MRSA infections be treated? Yes, there are antibiotics that can kill MRSA germs. Some patients with MRSA abscesses may need surgery to drain the infection. Your healthcare provider will determine which treatments are best for you. What are some of the things that hospitals are doing to prevent MRSA infections? To prevent MRSA infections, doctors, nurses and other healthcare providers:  Clean their hands with soap and water or an alcohol-based hand rub  before and after caring for every patient.  Carefully clean hospital rooms and medical equipment.  Use Contact Precautions when caring for patients with MRSA. Contact Precautions mean:  Whenever possible, patients with MRSA will have a single room or will share a room only with someone else who also has MRSA.  Healthcare providers will put on gloves and wear a gown over their clothing while taking care of patients with MRSA.  Visitors may also be asked to wear a gown and gloves.  When leaving the room, hospital providers and visitors remove their gown and gloves and clean their hands.  Patients on Contact Precautions are asked to stay in their hospital rooms as much as possible. They should not go to common areas, such as the gift shop or cafeteria. They may go to other areas of the hospital for treatments and tests.  May test some patients to see if they have MRSA on their skin. This test involves rubbing a cotton-tipped swab in the patient's nostrils or on the skin. What can I do to help prevent MRSA infections? In the hospital  Make sure that all doctors, nurses, and other healthcare providers clean their hands with soap and water or an alcohol-based hand rub  before and after caring for you. If you do not see your providers clean their hands, please ask them to do so. When you go home  If you have wounds or an intravascular device (such as a catheter or dialysis port) make sure that you know how to take care of them. Can my friends and family get MRSA when they visit me? The chance of getting MRSA while visiting a person who has MRSA is very low. To decrease the chance of getting MRSA your family and friends should:  Clean their hands before they enter your room and when they leave.  Ask a healthcare provider if they need to wear protective gowns and gloves when they visit you. What do I need to do when I go home from the hospital? To prevent another MRSA infection and to prevent  spreading MRSA to others:  Keep taking any antibiotics prescribed by your doctor. Don't take half-doses or stop before you complete your prescribed course.  Clean your hands often, especially before and after changing your wound dressing or bandage.  People who live with you should clean their hands often as well.  Keep any wounds clean and change bandages as instructed until healed.  Avoid sharing personal items such as towels or razors.  Wash and dry your clothes and bed linens in the warmest temperatures recommended on the labels.  Tell your healthcare providers that you have MRSA. This includes home health nurses and aides, therapists, and personnel in doctors' offices.  Your doctor may have more instructions for you. If you have questions, please ask your doctor or nurse. Developed and co-sponsored by [employer] for [place] [id number]); Infectious Diseases Society of [place] (IDSA); [place]; Association for Professionals in Infection Control and Epidemiology (APIC); Centers for Disease Control and Prevention (CDC); and [employer]. Document Released: 01/13/2013 Document Reviewed: 01/13/2013 [facility] Patient Information 2015 [place], [place]. This information is not intended to replace advice given to you by your health care provider. Make sure you discuss any questions you have with your health care provider.

## 2014-08-23 NOTE — Progress Notes (Signed)
   Subjective:    Patient ID: [name], female    DOB: [birth year], 54 y.o.   MRN: [id number]  HPI 54 y/o female presents for follow up of MRSA infection and dermatitis, possbily d/t scabies. She still has 1 or 2 days left of the Cipro. She Has also used the Elimite twice but feels like she is still having something bite her. She has an inside cat, that has not been treated for fleas. She states that she had lice in her house in the past and everyone in the house had to be treated. She was the only one treated in the house this time.     Review of Systems  Constitutional: Negative.   HENT: Negative.   Skin: Positive for rash. Negative for color change, pallor and wound.       No new bites  Overall itch        Objective:   Physical Exam  Constitutional: She is oriented to person, place, and time. She appears well-developed and well-nourished.  Musculoskeletal: Normal range of motion.  Neurological: She is alert and oriented to person, place, and time.  Skin:  PIH on abdomen and buttocks from skin infection  Dryness of skin   Psychiatric: She has a normal mood and affect.  Nursing note and vitals reviewed.         Assessment & Plan:  1. Scabies  - triamcinolone cream (KENALOG) 0.1 %; Apply 1 application topically 2 (two) times daily.  Dispense: 453 g; Refill: 2  2. Dermatitis  - triamcinolone cream (KENALOG) 0.1 %; Apply 1 application topically 2 (two) times daily.  Dispense: 453 g; Refill: 2  3. MRSA (methicillin resistant staph aureus) culture positive  - ciprofloxacin (CIPRO) 500 MG tablet; Take 1 tablet (500 mg total) by mouth 2 (two) times daily.  Dispense: 28 tablet; Refill: 0  4. 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  Dispense: 28 tablet; Refill: 0   Continue all meds  Health Maintenance reviewed RTO prn   Batoul Limes A. Benjamin Stain PA-C

## 2014-08-27 ENCOUNTER — Ambulatory Visit: Payer: Medicaid Other | Admitting: Physician Assistant

## 2014-09-28 ENCOUNTER — Other Ambulatory Visit: Payer: Self-pay | Admitting: Family

## 2014-09-29 NOTE — Telephone Encounter (Signed)
Please advise on refill- last seen by [facility] 03/22/14- no follow up appointment scheduled.

## 2014-09-29 NOTE — Telephone Encounter (Signed)
Last seen 08/23/14  [name]  If approved route to nurse to call into [facility]

## 2014-09-30 NOTE — Telephone Encounter (Signed)
rx called into pharmacy

## 2014-10-07 NOTE — Telephone Encounter (Signed)
Debbi took care of calling in Rx.

## 2014-11-05 ENCOUNTER — Encounter: Payer: Self-pay | Admitting: Family

## 2014-11-05 ENCOUNTER — Ambulatory Visit (INDEPENDENT_AMBULATORY_CARE_PROVIDER_SITE_OTHER): Payer: Medicaid Other | Admitting: Family

## 2014-11-05 VITALS — BP 107/68 | HR 55 | Temp 97.5°F | Ht 63.0 in | Wt 153.6 lb

## 2014-11-05 DIAGNOSIS — L080 Pyoderma: Secondary | ICD-10-CM | POA: Diagnosis not present

## 2014-11-05 MED ORDER — METHYLPREDNISOLONE 4 MG PO TBPK: ORAL_TABLET | ORAL | Status: DC

## 2014-11-05 MED ORDER — DOXYCYCLINE HYCLATE 100 MG PO TABS: 100.0000 mg | ORAL_TABLET | Freq: Two times a day (BID) | ORAL | Status: DC

## 2014-11-05 MED ORDER — MUPIROCIN 2 % EX OINT: 1.0000 "application " | TOPICAL_OINTMENT | Freq: Two times a day (BID) | CUTANEOUS | Status: DC

## 2014-11-05 NOTE — Progress Notes (Signed)
Subjective:    Patient ID: [name], female    DOB: Jul 19, 1960, 54 y.o.   MRN: [id number]  Pt presents to the office today. Pt states this rash is recurrent. Pt states she was diagnosed with scabies 08/23/14, but states the medications never seem to work. Pt states she does not believe it is scabies. Pt states she has "bombed" her home, painted all of her walls, and cleaned and still have the rash.  Pt states her son lives with her and he does not have any rash. Pt states she has thrown out all of her plants in her home. Pt describes the pain as a needle prick.  Rash This is a recurrent problem. The current episode started more than 1 month ago (July). The problem has been waxing and waning since onset. The affected locations include the back, face, genitalia, groin, left arm, right buttock, right upper leg, right lowerleg, left lower leg, left upper leg, left buttock and left hip. The rash is characterized by itchiness, pain, burning and redness. Pertinent negatives include no congestion, cough, diarrhea, eye pain, fatigue, fever, joint pain, nail changes, rhinorrhea, shortness of breath, sore throat or vomiting. Past treatments include anti-itch cream, antihistamine, cold compress, moisturizer and topical steroids.      Review of Systems  Constitutional: Negative.  Negative for fever and fatigue.  HENT: Negative.  Negative for congestion, rhinorrhea and sore throat.   Eyes: Negative.  Negative for pain.  Respiratory: Negative.  Negative for cough and shortness of breath.   Cardiovascular: Negative.  Negative for palpitations.  Gastrointestinal: Negative.  Negative for vomiting and diarrhea.  Endocrine: Negative.   Genitourinary: Negative.   Musculoskeletal: Negative.  Negative for joint pain.  Skin: Positive for rash. Negative for nail changes.  Neurological: Negative.  Negative for headaches.  Hematological: Negative.   Psychiatric/Behavioral: Negative.   All other systems  reviewed and are negative.      Objective:   Physical Exam  Constitutional: She is oriented to person, place, and time. She appears well-developed and well-nourished. No distress.  HENT:  Head: Normocephalic and atraumatic.  Eyes: Pupils are equal, round, and reactive to light.  Neck: Normal range of motion. Neck supple. No thyromegaly present.  Cardiovascular: Normal rate, regular rhythm, normal heart sounds and intact distal pulses.   No murmur heard. Pulmonary/Chest: Effort normal and breath sounds normal. No respiratory distress. She has no wheezes.  Abdominal: Soft. Bowel sounds are normal. She exhibits no distension. There is no tenderness.  Musculoskeletal: Normal range of motion. She exhibits no edema or tenderness.  Neurological: She is alert and oriented to person, place, and time. She has normal reflexes. No cranial nerve deficit.  Skin: Skin is warm and dry. Rash noted. There is erythema.  Generalized erythemas pustula on buttocks and bilateral legs  Psychiatric: She has a normal mood and affect. Her behavior is normal. Judgment and thought content normal.  Vitals reviewed.     BP 107/68 mmHg  Pulse 55  Temp(Src) 97.5 F (36.4 C) (Oral)  Ht 5\' 3"  (1.6 m)  Wt 153 lb 9.6 oz (69.673 kg)  BMI 27.22 kg/m2     Assessment & Plan:  1. Pustular rash -Do not scratch or pick at -Continue with cold compresses if helps -RTO prn - Ambulatory referral to Dermatology - mupirocin ointment (BACTROBAN) 2 %; Apply 1 application topically 2 (two) times daily.  Dispense: 22 g; Refill: 0 - doxycycline (VIBRA-TABS) 100 MG tablet; Take 1 tablet (100 mg  total) by mouth 2 (two) times daily.  Dispense: 20 tablet; Refill: 0 - methylPREDNISolone (MEDROL DOSEPAK) 4 MG TBPK tablet; Use as directed  Dispense: 21 tablet; Refill: 0  [name], FNP

## 2014-11-05 NOTE — Patient Instructions (Signed)

## 2014-11-08 ENCOUNTER — Ambulatory Visit: Payer: Medicaid Other | Admitting: Pediatrics

## 2014-11-13 ENCOUNTER — Other Ambulatory Visit: Payer: Self-pay | Admitting: Family

## 2014-11-26 ENCOUNTER — Other Ambulatory Visit: Payer: Self-pay | Admitting: Physician Assistant

## 2014-11-29 ENCOUNTER — Other Ambulatory Visit: Payer: Self-pay | Admitting: Physician Assistant

## 2014-11-29 NOTE — Telephone Encounter (Signed)
rx was approved by [facility] and pt is aware.

## 2014-11-29 NOTE — Telephone Encounter (Signed)
Patient last seen in office on 11-05-14. Rx last filled on 09-29-14 for #[id number]. Please advise. If approved please route to Pool A so nurse can phone in to pharmacy

## 2014-11-29 NOTE — Telephone Encounter (Signed)
Rx called into pharmacy and pt is aware. 

## 2015-01-17 ENCOUNTER — Other Ambulatory Visit: Payer: Self-pay | Admitting: Family

## 2015-01-25 ENCOUNTER — Other Ambulatory Visit: Payer: Self-pay | Admitting: Family

## 2015-01-25 ENCOUNTER — Telehealth: Payer: Self-pay | Admitting: Family

## 2015-01-25 NOTE — Telephone Encounter (Signed)
Patient would like refill of Xanax, please advise.

## 2015-01-25 NOTE — Telephone Encounter (Signed)
In order to consider this patient request, the patient will need to see a provider, preferably their PCP. 

## 2015-01-25 NOTE — Telephone Encounter (Signed)
Last seen 11/05/14  [name]  Last filled 11/29/14 #60 1 RF  If approved route to nurse [facility]

## 2015-01-25 NOTE — Telephone Encounter (Signed)
Aware,script ready, needs appointment to be seen.

## 2015-01-25 NOTE — Telephone Encounter (Signed)
rx called into pharmacy and patient aware that she will need to be seen.

## 2015-01-25 NOTE — Telephone Encounter (Signed)
Pt. Has not been evaluated for anxiety for over a year even though she has been in for minor illness. Her last scrip was filled by phone and she cancelled her last appointment. For each of these reasons she needs to be seen for office evaluation for use of xanax.Thanks, WS

## 2015-01-26 NOTE — Telephone Encounter (Signed)
Last seen 11/05/14  [name]  If approved route to nurse to call into

## 2015-01-27 ENCOUNTER — Encounter: Payer: Self-pay | Admitting: Family

## 2015-01-27 ENCOUNTER — Ambulatory Visit (INDEPENDENT_AMBULATORY_CARE_PROVIDER_SITE_OTHER): Payer: Medicaid Other | Admitting: Family

## 2015-01-27 VITALS — BP 109/77 | HR 72 | Temp 97.6°F | Ht 63.0 in | Wt 159.4 lb

## 2015-01-27 DIAGNOSIS — Z720 Tobacco use: Secondary | ICD-10-CM

## 2015-01-27 DIAGNOSIS — F329 Major depressive disorder, single episode, unspecified: Secondary | ICD-10-CM

## 2015-01-27 DIAGNOSIS — F32A Depression, unspecified: Secondary | ICD-10-CM

## 2015-01-27 DIAGNOSIS — F419 Anxiety disorder, unspecified: Secondary | ICD-10-CM

## 2015-01-27 MED ORDER — CITALOPRAM HYDROBROMIDE 20 MG PO TABS: 20.0000 mg | ORAL_TABLET | Freq: Every day | ORAL | Status: DC

## 2015-01-27 MED ORDER — ALPRAZOLAM 1 MG PO TABS: 1.0000 mg | ORAL_TABLET | Freq: Two times a day (BID) | ORAL | Status: DC | PRN

## 2015-01-27 MED ORDER — ESCITALOPRAM OXALATE 10 MG PO TABS: 10.0000 mg | ORAL_TABLET | Freq: Every day | ORAL | Status: DC

## 2015-01-27 NOTE — Patient Instructions (Signed)
Generalized Anxiety Disorder Generalized anxiety disorder (GAD) is a mental disorder. It interferes with life functions, including relationships, work, and school. GAD is different from normal anxiety, which everyone experiences at some point in their lives in response to specific life events and activities. Normal anxiety actually helps us prepare for and get through these life events and activities. Normal anxiety goes away after the event or activity is over.  GAD causes anxiety that is not necessarily related to specific events or activities. It also causes excess anxiety in proportion to specific events or activities. The anxiety associated with GAD is also difficult to control. GAD can vary from mild to severe. People with severe GAD can have intense waves of anxiety with physical symptoms (panic attacks).  SYMPTOMS The anxiety and worry associated with GAD are difficult to control. This anxiety and worry are related to many life events and activities and also occur more days than not for 6 months or longer. People with GAD also have three or more of the following symptoms (one or more in children):  Restlessness.   Fatigue.  Difficulty concentrating.   Irritability.  Muscle tension.  Difficulty sleeping or unsatisfying sleep. DIAGNOSIS GAD is diagnosed through an assessment by your health care provider. Your health care provider will ask you questions aboutyour mood,physical symptoms, and events in your life. Your health care provider may ask you about your medical history and use of alcohol or drugs, including prescription medicines. Your health care provider may also do a physical exam and blood tests. Certain medical conditions and the use of certain substances can cause symptoms similar to those associated with GAD. Your health care provider may refer you to a mental health specialist for further evaluation. TREATMENT The following therapies are usually used to treat GAD:    Medication. Antidepressant medication usually is prescribed for long-term daily control. Antianxiety medicines may be added in severe cases, especially when panic attacks occur.   Talk therapy (psychotherapy). Certain types of talk therapy can be helpful in treating GAD by providing support, education, and guidance. A form of talk therapy called cognitive behavioral therapy can teach you healthy ways to think about and react to daily life events and activities.  Stress managementtechniques. These include yoga, meditation, and exercise and can be very helpful when they are practiced regularly. A mental health specialist can help determine which treatment is best for you. Some people see improvement with one therapy. However, other people require a combination of therapies.   This information is not intended to replace advice given to you by your health care provider. Make sure you discuss any questions you have with your health care provider.   Document Released: 05/05/2012 Document Revised: 01/29/2014 Document Reviewed: 05/05/2012 Elsevier Interactive Patient Education 2016 Elsevier Inc.  

## 2015-01-27 NOTE — Progress Notes (Signed)
Subjective:    Patient ID: [name], female    DOB: 1960-09-22, 55 y.o.   MRN: [id number]  Pt presents to the office today for chronic follow up. Pt currently taking Norco for chronic back pain and receives from her ortho doctor. Pt currently taking xanax 1 mg BID for GAD.  Anxiety Presents for follow-up visit. Onset was more than 5 years ago. The problem has been waxing and waning. Symptoms include depressed mood, excessive worry, irritability and nervous/anxious behavior. Patient reports no insomnia, palpitations, panic, restlessness, shortness of breath or suicidal ideas. Symptoms occur occasionally. The severity of symptoms is mild. The symptoms are aggravated by family issues. The quality of sleep is good.   Her past medical history is significant for anxiety/panic attacks and depression. Past treatments include benzodiazephines. The treatment provided mild relief. Compliance with prior treatments has been good.  Depression      The patient presents with depression.  This is a chronic problem.  The current episode started more than 1 year ago.   The onset quality is gradual.   The problem occurs every several days.  Associated symptoms include irritable and sad.  Associated symptoms include no hopelessness, does not have insomnia, no restlessness, no headaches and no suicidal ideas.     The symptoms are aggravated by work stress.  Past medical history includes anxiety and depression.       Review of Systems  Constitutional: Positive for irritability.  HENT: Negative.   Eyes: Negative.   Respiratory: Negative.  Negative for shortness of breath.   Cardiovascular: Negative.  Negative for palpitations.  Gastrointestinal: Negative.   Endocrine: Negative.   Genitourinary: Negative.   Musculoskeletal: Negative.   Neurological: Negative.  Negative for headaches.  Hematological: Negative.   Psychiatric/Behavioral: Positive for depression. Negative for suicidal ideas. The patient is  nervous/anxious. The patient does not have insomnia.   All other systems reviewed and are negative.      Objective:   Physical Exam  Constitutional: She is oriented to person, place, and time. She appears well-developed and well-nourished. She is irritable. No distress.  HENT:  Head: Normocephalic and atraumatic.  Right Ear: External ear normal.  Left Ear: External ear normal.  Nose: Nose normal.  Mouth/Throat: Oropharynx is clear and moist.  Eyes: Pupils are equal, round, and reactive to light.  Neck: Normal range of motion. Neck supple. No thyromegaly present.  Cardiovascular: Normal rate, regular rhythm, normal heart sounds and intact distal pulses.   No murmur heard. Pulmonary/Chest: Effort normal and breath sounds normal. No respiratory distress. She has no wheezes.  Abdominal: Soft. Bowel sounds are normal. She exhibits no distension. There is no tenderness.  Musculoskeletal: Normal range of motion. She exhibits no edema or tenderness.  Neurological: She is alert and oriented to person, place, and time. She has normal reflexes. No cranial nerve deficit.  Skin: Skin is warm and dry.  Psychiatric: She has a normal mood and affect. Her behavior is normal. Judgment and thought content normal.  Vitals reviewed.     BP 109/77 mmHg  Pulse 72  Temp(Src) 97.6 F (36.4 C) (Oral)  Ht 5\' 3"  (1.6 m)  Wt 159 lb 6.4 oz (72.303 kg)  BMI 28.24 kg/m2     Assessment & Plan:  1. Tobacco user Smoking cessation discussed  2. Depression -Pt started on celexa today -Stress management  - ALPRAZolam (XANAX) 1 MG tablet; Take 1 tablet (1 mg total) by mouth 2 (two) times daily as needed.  Dispense: 60 tablet; Refill: 3 - citalopram (CELEXA) 20 MG tablet; Take 1 tablet (20 mg total) by mouth daily.  Dispense: 90 tablet; Refill: 1  3. Anxiety -Pt started on celexa today -Stress management  - ALPRAZolam (XANAX) 1 MG tablet; Take 1 tablet (1 mg total) by mouth 2 (two) times daily as needed.   Dispense: 60 tablet; Refill: 3 - citalopram (CELEXA) 20 MG tablet; Take 1 tablet (20 mg total) by mouth daily.  Dispense: 90 tablet; Refill: 1   Continue all meds Will hold off on lab work at this because pt request to wait until her insurance starts Health Maintenance reviewed Diet and exercise encouraged RTO 2 months  [name], FNP

## 2015-03-04 ENCOUNTER — Telehealth: Payer: Self-pay | Admitting: Family

## 2015-03-25 ENCOUNTER — Encounter: Payer: Self-pay | Admitting: Gastroenterology

## 2015-05-12 ENCOUNTER — Ambulatory Visit (INDEPENDENT_AMBULATORY_CARE_PROVIDER_SITE_OTHER): Payer: Self-pay

## 2015-05-12 ENCOUNTER — Ambulatory Visit (INDEPENDENT_AMBULATORY_CARE_PROVIDER_SITE_OTHER): Payer: Self-pay | Admitting: Family Medicine

## 2015-05-12 ENCOUNTER — Other Ambulatory Visit: Payer: Self-pay | Admitting: Family Medicine

## 2015-05-12 ENCOUNTER — Encounter: Payer: Self-pay | Admitting: Family Medicine

## 2015-05-12 VITALS — BP 99/77 | HR 55 | Temp 97.1°F | Ht 63.0 in | Wt 152.6 lb

## 2015-05-12 DIAGNOSIS — M542 Cervicalgia: Secondary | ICD-10-CM

## 2015-05-12 MED ORDER — BETAMETHASONE SOD PHOS & ACET 6 (3-3) MG/ML IJ SUSP: 6.0000 mg | Freq: Once | INTRAMUSCULAR | Status: DC

## 2015-05-12 MED ORDER — CYCLOBENZAPRINE HCL 10 MG PO TABS
10.0000 mg | ORAL_TABLET | Freq: Three times a day (TID) | ORAL | Status: DC | PRN
Start: 2015-05-12 — End: 2015-10-31

## 2015-05-12 NOTE — Progress Notes (Signed)
Subjective:  Patient ID: [name], female    DOB: 07-21-1960  Age: 55 y.o. MRN: [id number]  CC: Neck Pain   HPI [name] presents for [place] off of the bed 2 weeks ago when trying to turn off a ceiling fan. She landed on her head and twisted her neck. Since then pain has been increasing. She states that it is now greater than 10/10. It is located in the posterior neck and radiates equally down both superior trapezius borders into the shoulders and down into the hands with numbness to the arms and tingling.  History [name] has a past medical history of Depression; Hyperlipidemia; Sciatica; Back pain; Right leg pain; Hip pain; HSV-1 infection; Tobacco user; and Anxiety.   She has past surgical history that includes Tubal ligation; Cataract extraction; Tonsillectomy; Cesarean section; Bunionectomy; Elbow surgery; Rhytidectomy neck / cheek / chin; Novasure ablation; and Colonoscopy w/ polypectomy.   Her family history includes Brain cancer in her mother; Cancer - Other in her father; Colon cancer in her paternal grandmother, sister, and sister.She reports that she has been smoking Cigarettes.  She started smoking about 39 years ago. She has been smoking about 1.00 pack per day. She has never used smokeless tobacco. She reports that she does not drink alcohol or use illicit drugs.    ROS Review of Systems  Constitutional: Positive for activity change and appetite change. Negative for fever.  HENT: Negative for congestion, rhinorrhea and sore throat.   Eyes: Negative for visual disturbance.  Respiratory: Negative for cough and shortness of breath.   Cardiovascular: Negative for chest pain and palpitations.  Gastrointestinal: Negative for nausea, abdominal pain and diarrhea.  Genitourinary: Negative for dysuria.  Musculoskeletal: Positive for myalgias, back pain, arthralgias and neck pain.    Objective:  BP 99/77 mmHg  Pulse 55  Temp(Src) 97.1 F (36.2 C) (Oral)  Ht 5\' 3"   (1.6 m)  Wt 152 lb 9.6 oz (69.219 kg)  BMI 27.04 kg/m2  SpO2 98%  BP Readings from Last 3 Encounters:  05/12/15 99/77  01/27/15 109/77  11/05/14 107/68    Wt Readings from Last 3 Encounters:  05/12/15 152 lb 9.6 oz (69.219 kg)  01/27/15 159 lb 6.4 oz (72.303 kg)  11/05/14 153 lb 9.6 oz (69.673 kg)     Physical Exam  Constitutional: She appears well-developed and well-nourished.  HENT:  Head: Normocephalic.  Cardiovascular: Normal rate and regular rhythm.   No murmur heard. Pulmonary/Chest: Effort normal and breath sounds normal.  Musculoskeletal: She exhibits tenderness (posterior neck).     Lab Results  Component Value Date   WBC 14.5* 09/22/2013   HGB 14.1 09/22/2013   HCT 42.1 09/22/2013   PLT 307 09/22/2013   GLUCOSE 80 03/22/2014   ALT 16 08/24/2013   AST 16 08/24/2013   NA 139 03/22/2014   K 4.3 03/22/2014   CL 102 03/22/2014   CREATININE 0.73 03/22/2014   BUN 7 03/22/2014   CO2 23 03/22/2014   TSH 1.00 10/28/2013    Ct Coronary Morp W/cta Cor W/score W/ca W/cm &/or Wo/cm  11/17/2013  ADDENDUM REPORT: 11/17/2013 12:57 ADDENDUM: OVER-READ INTERPRETATION  CT CHEST The following report is an over-read performed by radiologist Dr. Alvino Blood [facility] on Creation date. This over-read does not include interpretation of cardiac or coronary anatomy or pathology. The CTA interpretation by the cardiologist is attached. There is a 4 mm noncalcified pulmonary nodule along the left oblique fissure (image 21, series 201). No  mediastinal hilar lymphadenopathy. Airways are normal. Limited view of the upper abdomen is unremarkable. No aggressive osseous lesion. Small left 4 mm pulmonary nodule. If the patient is at high risk for bronchogenic carcinoma, follow-up chest CT at 1 year is recommended. If the patient is at low risk, no follow-up is needed. This recommendation follows the consensus statement: Guidelines for Management of Small Pulmonary Nodules  Detected on CT Scans: A Statement from the [place] as published in Radiology 2005; 237:395-400. These results will be called to the ordering clinician or representative by the Radiologist Assistant, and communication documented in the PACS or zVision Dashboard. Electronically Signed   By: Suzy Bouchard M.D.   On: 11/17/2013 12:57   11/17/2013  CLINICAL DATA:  Chest pain Hypotension with stress test EXAM: Cardiac CTA MEDICATIONS: Sub lingual nitro. 4mg  and lopressor 0mg  TECHNIQUE: The patient was scanned on a [employer] [id number] slice scanner. Gantry rotation speed was 270 msecs. Collimation was .64mm. A 100 kV prospective scan was triggered in the descending thoracic aorta at 111 HU's with 5% padding centered around 78% of the R-R interval. Average HR during the scan was 54 bpm. The 3D data set was interpreted on a dedicated work station using MPR, MIP and VRT modes. A total of 80cc of contrast was used. FINDINGS: Non-cardiac: See separate report from [facility]. No significant findings on limited lung and soft tissue windows. Calcium Score:  0 Coronary Arteries: Right dominant with no anomalies LM: Normal LAD:  Normal D1: Normal D2: Normal Circumflex:  Small mostly AV groove branch normal RCA:  Dominant and normal PDA: Normal PLA:  Normal IMPRESSION: 1) Normal right dominant coronary arteries 2) Calcium score 0 3) Normal aortic root 2.6 cm Jenkins Rouge Electronically Signed: By: Jenkins Rouge M.D. On: 11/16/2013 14:08    Assessment & Plan:   [name] was seen today for neck pain.  Diagnoses and all orders for this visit:  Neck pain -     Cancel: DG Cervical Spine Complete; Future  Cervicalgia  Other orders -     cyclobenzaprine (FLEXERIL) 10 MG tablet; Take 1 tablet (10 mg total) by mouth 3 (three) times daily as needed for muscle spasms.  XR - c-spinemild evidence for spasm. No misalgnment, fx. Minimal DJD  Pt. Declined Physical therapy due to expense  I am having [name]  start on cyclobenzaprine. I am also having her maintain her HYDROcodone-acetaminophen, ALPRAZolam, and citalopram.  Meds ordered this encounter  Medications  . cyclobenzaprine (FLEXERIL) 10 MG tablet    Sig: Take 1 tablet (10 mg total) by mouth 3 (three) times daily as needed for muscle spasms.    Dispense:  90 tablet    Refill:  1     Follow-up: No Follow-up on file.  [name], M.D.

## 2015-05-18 ENCOUNTER — Emergency Department (HOSPITAL_COMMUNITY)
Admission: EM | Admit: 2015-05-18 | Discharge: 2015-05-18 | Disposition: A | Discharge status: Home / Self Care | Payer: Medicaid Other | Attending: Emergency Medicine | Admitting: Emergency Medicine

## 2015-05-18 ENCOUNTER — Encounter (HOSPITAL_COMMUNITY): Payer: Self-pay

## 2015-05-18 DIAGNOSIS — M1990 Unspecified osteoarthritis, unspecified site: Secondary | ICD-10-CM | POA: Insufficient documentation

## 2015-05-18 DIAGNOSIS — R21 Rash and other nonspecific skin eruption: Secondary | ICD-10-CM

## 2015-05-18 DIAGNOSIS — Z79891 Long term (current) use of opiate analgesic: Secondary | ICD-10-CM | POA: Insufficient documentation

## 2015-05-18 DIAGNOSIS — Z79899 Other long term (current) drug therapy: Secondary | ICD-10-CM | POA: Insufficient documentation

## 2015-05-18 DIAGNOSIS — E785 Hyperlipidemia, unspecified: Secondary | ICD-10-CM | POA: Insufficient documentation

## 2015-05-18 DIAGNOSIS — R221 Localized swelling, mass and lump, neck: Secondary | ICD-10-CM

## 2015-05-18 DIAGNOSIS — F329 Major depressive disorder, single episode, unspecified: Secondary | ICD-10-CM | POA: Insufficient documentation

## 2015-05-18 DIAGNOSIS — F17210 Nicotine dependence, cigarettes, uncomplicated: Secondary | ICD-10-CM | POA: Insufficient documentation

## 2015-05-18 HISTORY — DX: Unspecified osteoarthritis, unspecified site: M19.90

## 2015-05-18 LAB — RAPID STREP SCREEN (MED CTR MEBANE ONLY): Streptococcus, Group A Screen (Direct): NEGATIVE

## 2015-05-18 MED ORDER — DEXAMETHASONE SODIUM PHOSPHATE 10 MG/ML IJ SOLN
10.0000 mg | Freq: Once | INTRAMUSCULAR | Status: AC
  Administered 2015-05-18: 10 mg via INTRAMUSCULAR
  Filled 2015-05-18: qty 1

## 2015-05-18 MED ORDER — PREDNISONE 20 MG PO TABS: ORAL_TABLET | ORAL | Status: DC

## 2015-05-18 NOTE — ED Notes (Signed)
Patient reports of swollen tounge and sore throat x3 days. Denies difficulty swallowing. Denies shortness of breath. NAD noted. Also complains of left numbness and tingling since 0830 this am.  Patient denies weakness. States she had neck injury 1 month ago and normally has tingling and numbness in bilateral ext. Only complains of one today. Grips and strength equal bilaterally.

## 2015-05-18 NOTE — ED Provider Notes (Signed)
CSN: [id number]     Arrival date & time 05/18/15  1109 History  By signing my name below, I, [name], attest that this documentation has been prepared under the direction and in the presence of No att. providers found. Electronically Signed: Terressa Salazar, ED Scribe. 05/29/2015. 12:08 PM.  Chief Complaint  Patient presents with  . Oral Swelling   The history is provided by the patient. No language interpreter was used.   PCP: [name], FNP HPI Comments: [name] is a 55 y.o. female, with PMHx noted below, who presents to the Emergency Department complaining of tongue swelling with associated sore throat onset last night. Associated Sx include "raw" sensation inside the mouth including the tongue, speech difficulty which pt describes as "talking weird," and dry eyes. Pt denies any new exposures or new meds. Pt denies any Hx of stroke, chronic heart problems. Pt denies any Rx daily meds including Lisinopril. Pt also denies difficulty swallowing, SOB, weakness. As a secondary matter, pt also complains of a recurrent, persistent diffused rash onset nearly two years ago. Pt reports she has been seen for the same by her PCP who recommended she f/u with dermatology. Pt denies f/u with dermatology.  Past Medical History  Diagnosis Date  . Depression   . Hyperlipidemia   . Sciatica   . Back pain   . Right leg pain   . Hip pain   . HSV-1 infection   . Tobacco user   . Anxiety   . Arthritis    Past Surgical History  Procedure Laterality Date  . Tubal ligation    . Cataract extraction    . Tonsillectomy    . Cesarean section    . Bunionectomy    . Elbow surgery    . Rhytidectomy neck / cheek / chin    . Novasure ablation    . Colonoscopy w/ polypectomy     Family History  Problem Relation Age of Onset  . Brain cancer Mother   . Cancer - Other Father     Abdominal   . Colon cancer Sister   . Colon cancer Paternal Grandmother   . Colon cancer Sister    Social History   Substance Use Topics  . Smoking status: Current Every Day Smoker -- 1.00 packs/day    Types: Cigarettes    Start date: 01/23/1976  . Smokeless tobacco: Never Used     Comment: using the patch  . Alcohol Use: No   OB History    Gravida Para Term Preterm AB TAB SAB Ectopic Multiple Living   3 2 2  0 1 1 0 0 0 2     Review of Systems  HENT: Negative for trouble swallowing.        + tongue swelling  Eyes:       + dry eyes   Respiratory: Negative for shortness of breath.   Skin: Positive for color change and rash.  Neurological: Positive for speech difficulty. Negative for weakness.  All other systems reviewed and are negative.     Allergies  Codeine; Meperidine hcl; and Sulfa antibiotics  Home Medications   Prior to Admission medications   Medication Sig Start Date End Date Taking? Authorizing Provider  ALPRAZolam Duanne Moron) 1 MG tablet Take 1 tablet (1 mg total) by mouth 2 (two) times daily as needed. Patient taking differently: Take 1 mg by mouth 2 (two) times daily as needed for anxiety.  01/27/15  Yes [name], FNP  cyclobenzaprine ([place])  10 MG tablet Take 1 tablet (10 mg total) by mouth 3 (three) times daily as needed for muscle spasms. 05/12/15  Yes [name], MD  HYDROcodone-acetaminophen (NORCO/VICODIN) 5-325 MG per tablet Take 2 tablets by mouth every 4 (four) hours as needed for moderate pain. Neurologist give to patient for back.   Yes Historical Provider, MD  citalopram (CELEXA) 20 MG tablet Take 1 tablet (20 mg total) by mouth daily. Patient not taking: Reported on 05/18/2015 01/27/15   [name], FNP  predniSONE (DELTASONE) 20 MG tablet 2 tabs po daily x 4 days 05/19/15   [name], MD   Triage Vitals: BP 135/79 mmHg  Pulse 61  Temp(Src) 97.9 F (36.6 C) (Oral)  Resp 16  Ht 5\' 3"  (1.6 m)  Wt 163 lb (73.936 kg)  BMI 28.88 kg/m2  SpO2 100% Physical Exam  Constitutional: She is oriented to person, place, and time. She appears well-developed  and well-nourished.  HENT:  Head: Normocephalic.  Very mild swelling posterior pharynx. Dry tongue.   Eyes: EOM are normal.  Neck: Normal range of motion. Neck supple. Tracheal tenderness (mild tender cervical adenopathy ) present.  Cardiovascular: Normal rate.   Pulmonary/Chest: Effort normal. No stridor.   anterior fields clear.       Abdominal: She exhibits no distension.  Musculoskeletal: Normal range of motion.  Neurological: She is alert and oriented to person, place, and time.  Reflex Scores:      Patellar reflexes are 2+ on the right side and 2+ on the left side.      Achilles reflexes are 1+ on the right side and 1+ on the left side. No facial droop. Extraocular muscles intact.  5+ strength in UE and LE with f/e at major joints. Sensation to palpation intact in UE and LE. CNs 2-12 grossly intact.  EOMFI.  PERRL.   Finger nose and coordination intact bilateral.   Visual fields intact to finger testing. No nystagmus   Skin: Rash noted.  Diffused .5cm macules with mild excoriations.   Psychiatric: She has a normal mood and affect.  Nursing note and vitals reviewed.   ED Course  Procedures (including critical care time) DIAGNOSTIC STUDIES: Oxygen Saturation is 100% on ra, nl by my interpretation.    COORDINATION OF CARE: 11:51 AM: Discussed treatment plan which includes meds and f/u with PCP with pt at bedside; patient verbalizes understanding and agrees with treatment plan.  Labs Review Labs Reviewed  RAPID STREP SCREEN (NOT AT [facility])  CULTURE, GROUP A STREP [facility])   I have personally reviewed and evaluated these lab results as part of my medical decision-making. MDM   Final diagnoses:  Rash and nonspecific skin eruption  Throat swelling   Well appearing, normal neuro exam.  Primary complaint is tongue swelling - no angioedema, no stridor. Discussed reviewing medicines with pcp.  Pt denies ACEI. Nonspecific rash.  Discussed avoiding alcohol and vitamin  deficiency. Pt needs pcp fup.  Results and differential diagnosis were discussed with the patient/parent/guardian. Xrays were independently reviewed by myself.  Close follow up outpatient was discussed, comfortable with the plan.   Medications  dexamethasone (DECADRON) injection 10 mg (10 mg Intramuscular Given 05/18/15 1204)    Filed Vitals:   05/18/15 1125 05/18/15 1306  BP: 135/79 118/89  Pulse: 61 59  Temp: 97.9 F (36.6 C) 97.9 F (36.6 C)  TempSrc: Oral Oral  Resp: 16 14  Height: 5\' 3"  (1.6 m)   Weight: 163 lb (73.936 kg)  SpO2: 100% 98%    Final diagnoses:  Rash and nonspecific skin eruption  Throat swelling      [name], MD 05/29/15 1201

## 2015-05-21 LAB — CULTURE, GROUP A STREP (THRC)

## 2015-06-14 ENCOUNTER — Other Ambulatory Visit: Payer: Self-pay | Admitting: Family

## 2015-06-16 NOTE — Telephone Encounter (Signed)
rx called into pharmacy

## 2015-06-16 NOTE — Telephone Encounter (Signed)
Last filled 05/20/15, last seen 01/27/15. Route to pool

## 2015-10-11 ENCOUNTER — Telehealth: Payer: Self-pay | Admitting: Family

## 2015-10-11 ENCOUNTER — Other Ambulatory Visit: Payer: Self-pay | Admitting: Family

## 2015-10-11 NOTE — Telephone Encounter (Signed)
Patient NTBS for follow up and lab work  

## 2015-10-11 NOTE — Telephone Encounter (Signed)
Appt made for medrefill 

## 2015-10-18 ENCOUNTER — Encounter: Payer: Self-pay | Admitting: Family

## 2015-10-18 ENCOUNTER — Ambulatory Visit (INDEPENDENT_AMBULATORY_CARE_PROVIDER_SITE_OTHER): Payer: Self-pay | Admitting: Family

## 2015-10-18 VITALS — BP 111/74 | HR 84 | Temp 97.4°F | Ht 63.0 in | Wt 150.0 lb

## 2015-10-18 DIAGNOSIS — Z720 Tobacco use: Secondary | ICD-10-CM

## 2015-10-18 DIAGNOSIS — F329 Major depressive disorder, single episode, unspecified: Secondary | ICD-10-CM

## 2015-10-18 DIAGNOSIS — M5430 Sciatica, unspecified side: Secondary | ICD-10-CM

## 2015-10-18 DIAGNOSIS — F419 Anxiety disorder, unspecified: Secondary | ICD-10-CM

## 2015-10-18 DIAGNOSIS — F32A Depression, unspecified: Secondary | ICD-10-CM

## 2015-10-18 MED ORDER — CITALOPRAM HYDROBROMIDE 20 MG PO TABS: 20.0000 mg | ORAL_TABLET | Freq: Every day | ORAL | 1 refills | Status: DC

## 2015-10-18 MED ORDER — ALPRAZOLAM 1 MG PO TABS: ORAL_TABLET | ORAL | 3 refills | Status: DC

## 2015-10-18 NOTE — Patient Instructions (Signed)
Stress and Stress Management Stress is a normal reaction to life events. It is what you feel when life demands more than you are used to or more than you can handle. Some stress can be useful. For example, the stress reaction can help you catch the last bus of the day, study for a test, or meet a deadline at work. But stress that occurs too often or for too long can cause problems. It can affect your emotional health and interfere with relationships and normal daily activities. Too much stress can weaken your immune system and increase your risk for physical illness. If you already have a medical problem, stress can make it worse. CAUSES  All sorts of life events may cause stress. An event that causes stress for one person may not be stressful for another person. Major life events commonly cause stress. These may be positive or negative. Examples include losing your job, moving into a new home, getting married, having a baby, or losing a loved one. Less obvious life events may also cause stress, especially if they occur day after day or in combination. Examples include working long hours, driving in traffic, caring for children, being in debt, or being in a difficult relationship. SIGNS AND SYMPTOMS Stress may cause emotional symptoms including, the following:  Anxiety. This is feeling worried, afraid, on edge, overwhelmed, or out of control.  Anger. This is feeling irritated or impatient.  Depression. This is feeling sad, down, helpless, or guilty.  Difficulty focusing, remembering, or making decisions. Stress may cause physical symptoms, including the following:   Aches and pains. These may affect your head, neck, back, stomach, or other areas of your body.  Tight muscles or clenched jaw.  Low energy or trouble sleeping. Stress may cause unhealthy behaviors, including the following:   Eating to feel better (overeating) or skipping meals.  Sleeping too little, too much, or both.  Working  too much or putting off tasks (procrastination).  Smoking, drinking alcohol, or using drugs to feel better. DIAGNOSIS  Stress is diagnosed through an assessment by your health care provider. Your health care provider will ask questions about your symptoms and any stressful life events.Your health care provider will also ask about your medical history and may order blood tests or other tests. Certain medical conditions and medicine can cause physical symptoms similar to stress. Mental illness can cause emotional symptoms and unhealthy behaviors similar to stress. Your health care provider may refer you to a mental health professional for further evaluation.  TREATMENT  Stress management is the recommended treatment for stress.The goals of stress management are reducing stressful life events and coping with stress in healthy ways.  Techniques for reducing stressful life events include the following:  Stress identification. Self-monitor for stress and identify what causes stress for you. These skills may help you to avoid some stressful events.  Time management. Set your priorities, keep a calendar of events, and learn to say "no." These tools can help you avoid making too many commitments. Techniques for coping with stress include the following:  Rethinking the problem. Try to think realistically about stressful events rather than ignoring them or overreacting. Try to find the positives in a stressful situation rather than focusing on the negatives.  Exercise. Physical exercise can release both physical and emotional tension. The key is to find a form of exercise you enjoy and do it regularly.  Relaxation techniques. These relax the body and mind. Examples include yoga, meditation, tai chi, biofeedback, deep  breathing, progressive muscle relaxation, listening to music, being out in nature, journaling, and other hobbies. Again, the key is to find one or more that you enjoy and can do  regularly.  Healthy lifestyle. Eat a balanced diet, get plenty of sleep, and do not smoke. Avoid using alcohol or drugs to relax.  Strong support network. Spend time with family, friends, or other people you enjoy being around.Express your feelings and talk things over with someone you trust. Counseling or talktherapy with a mental health professional may be helpful if you are having difficulty managing stress on your own. Medicine is typically not recommended for the treatment of stress.Talk to your health care provider if you think you need medicine for symptoms of stress. HOME CARE INSTRUCTIONS  Keep all follow-up visits as directed by your health care provider.  Take all medicines as directed by your health care provider. SEEK MEDICAL CARE IF:  Your symptoms get worse or you start having new symptoms.  You feel overwhelmed by your problems and can no longer manage them on your own. SEEK IMMEDIATE MEDICAL CARE IF:  You feel like hurting yourself or someone else.   This information is not intended to replace advice given to you by your health care provider. Make sure you discuss any questions you have with your health care provider.   Document Released: 07/04/2000 Document Revised: 01/29/2014 Document Reviewed: 09/02/2012 Elsevier Interactive Patient Education 2016 [employer]  

## 2015-10-18 NOTE — Progress Notes (Addendum)
Subjective:    Patient ID: [name], female    DOB: 1960/01/29, 55 y.o.   MRN: [id number]  Pt presents to the office today for chronic follow up. Pt currently taking Norco for chronic back pain and receives from her ortho doctor. Pt currently taking xanax 1 mg BID for GAD. Pt was prescribed Celexa, but stopped because she thought it was making her sick. Pt states she is willing to try again.   Medication Refill  Associated symptoms include numbness and weakness. Pertinent negatives include no headaches.  Anxiety  Presents for follow-up visit. Onset was more than 5 years ago. The problem has been waxing and waning. Symptoms include depressed mood, excessive worry, irritability and nervous/anxious behavior. Patient reports no insomnia, palpitations, panic, restlessness, shortness of breath or suicidal ideas. Symptoms occur occasionally. The severity of symptoms is mild. The symptoms are aggravated by family issues. The quality of sleep is good.   Her past medical history is significant for anxiety/panic attacks and depression. Past treatments include benzodiazephines. The treatment provided mild relief. Compliance with prior treatments has been good.  Depression       The patient presents with depression.  This is a chronic problem.  The current episode started more than 1 year ago.   The onset quality is gradual.   The problem occurs every several days.  Associated symptoms include irritable and sad.  Associated symptoms include no hopelessness, does not have insomnia, no restlessness, no headaches and no suicidal ideas.     The symptoms are aggravated by work stress.  Past medical history includes anxiety and depression.   Back Pain  This is a chronic problem. The current episode started more than 1 year ago. The problem occurs constantly. The pain is present in the gluteal and lumbar spine. The pain is at a severity of 6/10. The pain is moderate. The symptoms are aggravated by lying down.  Associated symptoms include leg pain, numbness, tingling and weakness. Pertinent negatives include no bladder incontinence, bowel incontinence or headaches. She has tried analgesics and bed rest for the symptoms. The treatment provided moderate relief.      Review of Systems  Constitutional: Positive for irritability.  HENT: Negative.   Eyes: Negative.   Respiratory: Negative.  Negative for shortness of breath.   Cardiovascular: Negative.  Negative for palpitations.  Gastrointestinal: Negative.  Negative for bowel incontinence.  Endocrine: Negative.   Genitourinary: Negative.  Negative for bladder incontinence.  Musculoskeletal: Positive for back pain.  Neurological: Positive for tingling, weakness and numbness. Negative for headaches.  Hematological: Negative.   Psychiatric/Behavioral: Positive for depression. Negative for suicidal ideas. The patient is nervous/anxious. The patient does not have insomnia.   All other systems reviewed and are negative.      Objective:   Physical Exam  Constitutional: She is oriented to person, place, and time. She appears well-developed and well-nourished. She is irritable. No distress.  HENT:  Head: Normocephalic and atraumatic.  Right Ear: External ear normal.  Left Ear: External ear normal.  Nose: Nose normal.  Mouth/Throat: Oropharynx is clear and moist.  Eyes: Pupils are equal, round, and reactive to light.  Neck: Normal range of motion. Neck supple. No thyromegaly present.  Cardiovascular: Normal rate, regular rhythm, normal heart sounds and intact distal pulses.   No murmur heard. Pulmonary/Chest: Effort normal and breath sounds normal. No respiratory distress. She has no wheezes.  Abdominal: Soft. Bowel sounds are normal. She exhibits no distension. There is no tenderness.  Musculoskeletal: Normal range of motion. She exhibits no edema or tenderness.  Neurological: She is alert and oriented to person, place, and time. She has normal  reflexes. No cranial nerve deficit.  Skin: Skin is warm and dry.  Psychiatric: She has a normal mood and affect. Her behavior is normal. Judgment and thought content normal.  Tearful   Vitals reviewed.    BP 111/74   Pulse 84   Temp 97.4 F (36.3 C) (Oral)   Ht 5\' 3"  (1.6 m)   Wt 150 lb (68 kg)   BMI 26.57 kg/m      Assessment & Plan:  1. Anxiety -PT restarted on Celexa 20 mg today Encourage patient to start this!! PT requesting to have xanax increased today, but told patient we could not increase today -Stress management discussed - citalopram (CELEXA) 20 MG tablet; Take 1 tablet (20 mg total) by mouth daily.  Dispense: 90 tablet; Refill: 1 - ALPRAZolam (XANAX) 1 MG tablet; TAKE 1 TABLET 2 TIMES A DAY AS NEEDED  Dispense: 60 tablet; Refill: 3  2. Tobacco user -Smoking cessation discussed  3. Depression -PT restarted on Celexa 20 mg today Encourage patient to start this!! PT requesting to have xanax increased today, but told patient we could not increase today -Stress management discussed - citalopram (CELEXA) 20 MG tablet; Take 1 tablet (20 mg total) by mouth daily.  Dispense: 90 tablet; Refill: 1 - ALPRAZolam (XANAX) 1 MG tablet; TAKE 1 TABLET 2 TIMES A DAY AS NEEDED  Dispense: 60 tablet; Refill: 3  4. Sciatica, unspecified laterality -Rest -Continue Ortho appointment -Continue pain medications   Pt still does not have insurance and is having a hard time affording medications. Pt tearful and states she does not know what to do. Pt encouraged [facility] and lab work.   [name], FNP

## 2015-10-30 ENCOUNTER — Other Ambulatory Visit: Payer: Self-pay | Admitting: Family Medicine

## 2015-10-31 ENCOUNTER — Other Ambulatory Visit: Payer: Self-pay

## 2015-10-31 MED ORDER — CYCLOBENZAPRINE HCL 10 MG PO TABS: 10.0000 mg | ORAL_TABLET | Freq: Three times a day (TID) | ORAL | 0 refills | Status: DC | PRN

## 2015-10-31 NOTE — Telephone Encounter (Signed)
Aware, flexeril was refilled.

## 2015-10-31 NOTE — Telephone Encounter (Signed)
I'm fine with giving her a one-month refill for cyclobenzaprine and then have her follow-up with [facility] for that afterwards.

## 2015-10-31 NOTE — Telephone Encounter (Signed)
Please review

## 2015-11-15 ENCOUNTER — Encounter: Payer: Self-pay | Admitting: *Deleted

## 2015-11-29 ENCOUNTER — Telehealth: Payer: Self-pay | Admitting: Family

## 2015-12-09 NOTE — Telephone Encounter (Signed)
Tried to contact patient, no answer jkp 11/17

## 2015-12-16 NOTE — Telephone Encounter (Signed)
Patient called stating that she is feeling better.

## 2016-02-06 ENCOUNTER — Other Ambulatory Visit: Payer: Self-pay | Admitting: Family

## 2016-02-06 DIAGNOSIS — F419 Anxiety disorder, unspecified: Secondary | ICD-10-CM

## 2016-02-08 MED ORDER — ALPRAZOLAM 1 MG PO TABS: ORAL_TABLET | ORAL | 1 refills | Status: DC

## 2016-02-08 NOTE — Telephone Encounter (Addendum)
Last filled 01/17/2016. Last seen 10/18/2015.

## 2016-04-03 ENCOUNTER — Other Ambulatory Visit: Payer: Self-pay | Admitting: Family

## 2016-04-03 DIAGNOSIS — F419 Anxiety disorder, unspecified: Secondary | ICD-10-CM

## 2016-04-05 NOTE — Telephone Encounter (Signed)
Refill called to [facility] 

## 2016-05-04 ENCOUNTER — Other Ambulatory Visit: Payer: Self-pay | Admitting: Family

## 2016-05-04 DIAGNOSIS — F419 Anxiety disorder, unspecified: Secondary | ICD-10-CM

## 2016-05-07 NOTE — Telephone Encounter (Signed)
Rx called in 

## 2016-06-08 ENCOUNTER — Telehealth: Payer: Self-pay | Admitting: Family

## 2016-06-08 ENCOUNTER — Other Ambulatory Visit: Payer: Self-pay | Admitting: Family

## 2016-06-08 DIAGNOSIS — F419 Anxiety disorder, unspecified: Secondary | ICD-10-CM

## 2016-06-08 NOTE — Telephone Encounter (Signed)
Last seen 10/18/15 Last filled 05/07/16

## 2016-06-08 NOTE — Telephone Encounter (Signed)
What is the name of the medication? xanax  Have you contacted your pharmacy to request a refill? No. She has run out and wants it asap.  Which pharmacy would you [facility] sent to? [facility]   Patient notified that their request is being sent to the clinical staff for review and that they should receive a call once it is complete. If they do not receive a call within 24 hours they can check with their pharmacy or our office.

## 2016-06-09 ENCOUNTER — Other Ambulatory Visit: Payer: Self-pay | Admitting: Family

## 2016-06-09 DIAGNOSIS — F419 Anxiety disorder, unspecified: Secondary | ICD-10-CM

## 2016-06-11 ENCOUNTER — Ambulatory Visit (INDEPENDENT_AMBULATORY_CARE_PROVIDER_SITE_OTHER): Payer: Self-pay | Admitting: Family

## 2016-06-11 ENCOUNTER — Encounter: Payer: Self-pay | Admitting: Family

## 2016-06-11 VITALS — BP 114/85 | HR 79 | Temp 96.9°F | Ht 63.0 in | Wt 152.3 lb

## 2016-06-11 DIAGNOSIS — Z720 Tobacco use: Secondary | ICD-10-CM

## 2016-06-11 DIAGNOSIS — F419 Anxiety disorder, unspecified: Secondary | ICD-10-CM

## 2016-06-11 DIAGNOSIS — K029 Dental caries, unspecified: Secondary | ICD-10-CM

## 2016-06-11 MED ORDER — ALPRAZOLAM 1 MG PO TABS
1.0000 mg | ORAL_TABLET | Freq: Two times a day (BID) | ORAL | 5 refills | Status: DC | PRN
Start: 2016-06-11 — End: 2016-12-10

## 2016-06-11 MED ORDER — ALPRAZOLAM 1 MG PO TABS: 1.0000 mg | ORAL_TABLET | Freq: Two times a day (BID) | ORAL | 5 refills | Status: DC | PRN

## 2016-06-11 MED ORDER — FLUOXETINE HCL 20 MG PO TABS: 20.0000 mg | ORAL_TABLET | Freq: Every day | ORAL | 1 refills | Status: DC

## 2016-06-11 NOTE — Progress Notes (Signed)
   Subjective:    Patient ID: [name], female    DOB: March 10, 1960, 56 y.o.   MRN: [id number]  PT presents to the office today follow up with GAD. PT states the celexa is "messing my mind" and states she say "I need to the clothes in the stove". PT has stopped the celexa, but has continued her xanax 1 mg.   PT also complaining of left lower tooth pain. Pt states it started hurting about a month ago, but the pain has increased. Intermittent pain of 8 out 10. PT states she does not have insurance and can not afford surgery.  Anxiety  Presents for follow-up visit. Symptoms include decreased concentration, depressed mood, excessive worry, insomnia, irritability, nervous/anxious behavior and restlessness. Symptoms occur most days.        Review of Systems  Constitutional: Positive for irritability.  Musculoskeletal: Positive for back pain.  Psychiatric/Behavioral: Positive for decreased concentration. The patient is nervous/anxious and has insomnia.   All other systems reviewed and are negative.      Objective:   Physical Exam  Constitutional: She is oriented to person, place, and time. She appears well-developed and well-nourished. No distress.  HENT:  Head: Normocephalic.  Mouth/Throat: Dental caries present.  Eyes: Pupils are equal, round, and reactive to light.  Cardiovascular: Normal rate, regular rhythm, normal heart sounds and intact distal pulses.   No murmur heard. Pulmonary/Chest: Effort normal and breath sounds normal. No respiratory distress. She has no wheezes.  Abdominal: Soft. Bowel sounds are normal. She exhibits no distension. There is no tenderness.  Musculoskeletal: Normal range of motion. She exhibits no edema or tenderness.  Neurological: She is alert and oriented to person, place, and time.  Skin: Skin is warm and dry.  Psychiatric: She has a normal mood and affect. Her behavior is normal. Judgment and thought content normal.  Vitals reviewed.   BP  114/85   Pulse 79   Temp (!) 96.9 F (36.1 C) (Oral)   Ht 5\' 3"  (1.6 m)   Wt 152 lb 4.8 oz (69.1 kg)   BMI 26.98 kg/m        Assessment & Plan:  1. Anxiety Pt started on Prozac today Stress management discussed RTO in 6 months - ALPRAZolam (XANAX) 1 MG tablet; Take 1 tablet (1 mg total) by mouth 2 (two) times daily as needed.  Dispense: 60 tablet; Refill: 5 - FLUoxetine (PROZAC) 20 MG tablet; Take 1 tablet (20 mg total) by mouth daily.  Dispense: 90 tablet; Refill: 1  2. Tobacco user  3. Pain due to dental caries -Follow up with dentists    [name], FNP

## 2016-06-11 NOTE — Telephone Encounter (Signed)
Patient was seen. 

## 2016-06-11 NOTE — Telephone Encounter (Signed)
PT needs to be seen

## 2016-06-11 NOTE — Patient Instructions (Signed)

## 2016-12-04 ENCOUNTER — Other Ambulatory Visit: Payer: Self-pay | Admitting: Family

## 2016-12-06 ENCOUNTER — Other Ambulatory Visit: Payer: Self-pay | Admitting: Family

## 2016-12-06 DIAGNOSIS — F419 Anxiety disorder, unspecified: Secondary | ICD-10-CM

## 2016-12-07 NOTE — Telephone Encounter (Signed)
It would probably be best that she wait until Monday with [facility]

## 2016-12-10 ENCOUNTER — Ambulatory Visit (INDEPENDENT_AMBULATORY_CARE_PROVIDER_SITE_OTHER): Payer: Self-pay | Admitting: Family

## 2016-12-10 ENCOUNTER — Encounter: Payer: Self-pay | Admitting: Family

## 2016-12-10 VITALS — BP 119/73 | HR 61 | Temp 97.1°F | Ht 63.0 in | Wt 150.8 lb

## 2016-12-10 DIAGNOSIS — M5441 Lumbago with sciatica, right side: Secondary | ICD-10-CM

## 2016-12-10 DIAGNOSIS — Z720 Tobacco use: Secondary | ICD-10-CM

## 2016-12-10 DIAGNOSIS — G8929 Other chronic pain: Secondary | ICD-10-CM

## 2016-12-10 DIAGNOSIS — M5442 Lumbago with sciatica, left side: Secondary | ICD-10-CM

## 2016-12-10 DIAGNOSIS — F419 Anxiety disorder, unspecified: Secondary | ICD-10-CM

## 2016-12-10 DIAGNOSIS — J410 Simple chronic bronchitis: Secondary | ICD-10-CM

## 2016-12-10 DIAGNOSIS — J449 Chronic obstructive pulmonary disease, unspecified: Secondary | ICD-10-CM | POA: Insufficient documentation

## 2016-12-10 LAB — CMP14+EGFR
ALT: 13 IU/L (ref 0–32)
AST: 18 IU/L (ref 0–40)
Albumin/Globulin Ratio: 1.8 (ref 1.2–2.2)
Albumin: 3.9 g/dL (ref 3.5–5.5)
Alkaline Phosphatase: 78 IU/L (ref 39–117)
BUN/Creatinine Ratio: 10 (ref 9–23)
BUN: 7 mg/dL (ref 6–24)
Bilirubin Total: 0.3 mg/dL (ref 0.0–1.2)
CO2: 26 mmol/L (ref 20–29)
Calcium: 9.3 mg/dL (ref 8.7–10.2)
Chloride: 105 mmol/L (ref 96–106)
Creatinine, Ser: 0.71 mg/dL (ref 0.57–1.00)
GFR calc Af Amer: 111 mL/min/{1.73_m2} (ref >59)
GFR calc non Af Amer: 96 mL/min/{1.73_m2} (ref >59)
Globulin, Total: 2.2 g/dL (ref 1.5–4.5)
Glucose: 83 mg/dL (ref 65–99)
Potassium: 4.8 mmol/L (ref 3.5–5.2)
Sodium: 143 mmol/L (ref 134–144)
Total Protein: 6.1 g/dL (ref 6.0–8.5)

## 2016-12-10 MED ORDER — ALPRAZOLAM 1 MG PO TABS: 1.0000 mg | ORAL_TABLET | Freq: Two times a day (BID) | ORAL | 5 refills | Status: DC | PRN

## 2016-12-10 MED ORDER — BUDESONIDE-FORMOTEROL FUMARATE 160-4.5 MCG/ACT IN AERO
2.0000 | INHALATION_SPRAY | Freq: Two times a day (BID) | RESPIRATORY_TRACT | 3 refills | Status: DC
Start: 2016-12-10 — End: 2017-04-23

## 2016-12-10 NOTE — Progress Notes (Signed)
Subjective:    Patient ID: [name], female    DOB: July 05, 1960, 56 y.o.   MRN: [id number]  PT presents to the office today for GAD for follow up. Pt has tried Celexa and Prozac and states makes her feel "strange" and can not tolerate. Pt continues her Xanax 1 mg BID.   Pt is followed by Neurosurgeon for chronic back pain every every 3 months stable.  Anxiety  Presents for follow-up visit. Symptoms include depressed mood, irritability and nervous/anxious behavior. Patient reports no excessive worry or restlessness. Symptoms occur occasionally. The severity of symptoms is moderate. The quality of sleep is good.    COPD Pt currently smoking a pack a day. PT has albuterol as needed.    Review of Systems  Constitutional: Positive for irritability.  Psychiatric/Behavioral: The patient is nervous/anxious.   All other systems reviewed and are negative.      Objective:   Physical Exam  Constitutional: She is oriented to person, place, and time. She appears well-developed and well-nourished. No distress.  HENT:  Head: Normocephalic and atraumatic.  Right Ear: External ear normal.  Left Ear: External ear normal.  Nose: Nose normal.  Mouth/Throat: Oropharynx is clear and moist. Abnormal dentition. Dental caries present.  Eyes: Pupils are equal, round, and reactive to light.  Neck: Normal range of motion. Neck supple. No thyromegaly present.  Cardiovascular: Normal rate, regular rhythm, normal heart sounds and intact distal pulses.  No murmur heard. Pulmonary/Chest: Effort normal. No respiratory distress. She has decreased breath sounds. She has wheezes.  Abdominal: Soft. Bowel sounds are normal. She exhibits no distension. There is no tenderness.  Musculoskeletal: Normal range of motion. She exhibits no edema or tenderness.  Neurological: She is alert and oriented to person, place, and time.  Skin: Skin is warm and dry.  Psychiatric: She has a normal mood and affect. Her behavior  is normal. Judgment and thought content normal.  Vitals reviewed.     BP 119/73   Pulse 61   Temp (!) 97.1 F (36.2 C) (Oral)   Ht 5' 3" (1.6 m)   Wt 150 lb 12.8 oz (68.4 kg)   BMI 26.71 kg/m      Assessment & Plan:  1. Anxiety Stress management discussed - CMP14+EGFR - ALPRAZolam (XANAX) 1 MG tablet; Take 1 tablet (1 mg total) 2 (two) times daily as needed by mouth.  Dispense: 60 tablet; Refill: 5  2. Tobacco user Smoking cessation discussed - CMP14+EGFR  3. Chronic bilateral low back pain with bilateral sciatica Keep appt with Specialists  - CMP14+EGFR - budesonide-formoterol (SYMBICORT) 160-4.5 MCG/ACT inhaler; Inhale 2 puffs 2 (two) times daily into the lungs.  Dispense: 1 Inhaler; Refill: 3  4. Simple chronic bronchitis ([place]) Smoking cessation discussed Symbicort started today, sample given today  Continue all meds Labs pending Health Maintenance reviewed Diet and exercise encouraged RTO 6 months   [name], FNP

## 2016-12-10 NOTE — Patient Instructions (Addendum)
Chronic Obstructive Pulmonary Disease Chronic obstructive pulmonary disease (COPD) is a common lung condition in which airflow from the lungs is limited. COPD is a general term that can be used to describe many different lung problems that limit airflow, including both chronic bronchitis and emphysema. If you have COPD, your lung function will probably never return to normal, but there are measures you can take to improve lung function and make yourself feel better. What are the causes?  Smoking (common).  Exposure to secondhand smoke.  Genetic problems.  Chronic inflammatory lung diseases or recurrent infections. What are the signs or symptoms?  Shortness of breath, especially with physical activity.  Deep, persistent (chronic) cough with a large amount of thick mucus.  Wheezing.  Rapid breaths (tachypnea).  Gray or bluish discoloration (cyanosis) of the skin, especially in your fingers, toes, or lips.  Fatigue.  Weight loss.  Frequent infections or episodes when breathing symptoms become much worse (exacerbations).  Chest tightness. How is this diagnosed? Your health care provider will take a medical history and perform a physical examination to diagnose COPD. Additional tests for COPD may include:  Lung (pulmonary) function tests.  Chest X-ray.  CT scan.  Blood tests. How is this treated? Treatment for COPD may include:  Inhaler and nebulizer medicines. These help manage the symptoms of COPD and make your breathing more comfortable.  Supplemental oxygen. Supplemental oxygen is only helpful if you have a low oxygen level in your blood.  Exercise and physical activity. These are beneficial for nearly all people with COPD.  Lung surgery or transplant.  Nutrition therapy to gain weight, if you are underweight.  Pulmonary rehabilitation. This may involve working with a team of health care providers and specialists, such as respiratory, occupational, and physical  therapists. Follow these instructions at home:  Take all medicines (inhaled or pills) as directed by your health care provider.  Avoid over-the-counter medicines or cough syrups that dry up your airway (such as antihistamines) and slow down the elimination of secretions unless instructed otherwise by your health care provider.  If you are a smoker, the most important thing that you can do is stop smoking. Continuing to smoke will cause further lung damage and breathing trouble. Ask your health care provider for help with quitting smoking. He or she can direct you to community resources or hospitals that provide support.  Avoid exposure to irritants such as smoke, chemicals, and fumes that aggravate your breathing.  Use oxygen therapy and pulmonary rehabilitation if directed by your health care provider. If you require home oxygen therapy, ask your health care provider whether you should purchase a pulse oximeter to measure your oxygen level at home.  Avoid contact with individuals who have a contagious illness.  Avoid extreme temperature and humidity changes.  Eat healthy foods. Eating smaller, more frequent meals and resting before meals may help you maintain your strength.  Stay active, but balance activity with periods of rest. Exercise and physical activity will help you maintain your ability to do things you want to do.  Preventing infection and hospitalization is very important when you have COPD. Make sure to receive all the vaccines your health care provider recommends, especially the pneumococcal and influenza vaccines. Ask your health care provider whether you need a pneumonia vaccine.  Learn and use relaxation techniques to manage stress.  Learn and use controlled breathing techniques as directed by your health care provider. Controlled breathing techniques include: 1. Pursed lip breathing. Start by breathing in (inhaling)   through your nose for 1 second. Then, purse your lips as  if you were going to whistle and breathe out (exhale) through the pursed lips for 2 seconds. 2. Diaphragmatic breathing. Start by putting one hand on your abdomen just above your waist. Inhale slowly through your nose. The hand on your abdomen should move out. Then purse your lips and exhale slowly. You should be able to feel the hand on your abdomen moving in as you exhale.  Learn and use controlled coughing to clear mucus from your lungs. Controlled coughing is a series of short, progressive coughs. The steps of controlled coughing are: 1. Lean your head slightly forward. 2. Breathe in deeply using diaphragmatic breathing. 3. Try to hold your breath for 3 seconds. 4. Keep your mouth slightly open while coughing twice. 5. Spit any mucus out into a tissue. 6. Rest and repeat the steps once or twice as needed. Contact a health care provider if:  You are coughing up more mucus than usual.  There is a change in the color or thickness of your mucus.  Your breathing is more labored than usual.  Your breathing is faster than usual. Get help right away if:  You have shortness of breath while you are resting.  You have shortness of breath that prevents you from:  Being able to talk.  Performing your usual physical activities.  You have chest pain lasting longer than 5 minutes.  Your skin color is more cyanotic than usual.  You measure low oxygen saturations for longer than 5 minutes with a pulse oximeter. This information is not intended to replace advice given to you by your health care provider. Make sure you discuss any questions you have with your health care provider. Document Released: 10/18/2004 Document Revised: 06/16/2015 Document Reviewed: 09/04/2012 [employer] Interactive Patient Education  2017 [employer]  

## 2017-02-22 ENCOUNTER — Encounter: Payer: Self-pay | Admitting: Family Medicine

## 2017-02-22 ENCOUNTER — Ambulatory Visit: Payer: Self-pay | Admitting: Family Medicine

## 2017-02-22 ENCOUNTER — Ambulatory Visit (INDEPENDENT_AMBULATORY_CARE_PROVIDER_SITE_OTHER): Payer: Self-pay | Admitting: Family Medicine

## 2017-02-22 VITALS — BP 111/79 | HR 80 | Temp 97.0°F | Ht 63.0 in | Wt 149.0 lb

## 2017-02-22 DIAGNOSIS — M67472 Ganglion, left ankle and foot: Secondary | ICD-10-CM

## 2017-02-22 DIAGNOSIS — J441 Chronic obstructive pulmonary disease with (acute) exacerbation: Secondary | ICD-10-CM

## 2017-02-22 MED ORDER — METHYLPREDNISOLONE ACETATE 80 MG/ML IJ SUSP: 80.0000 mg | Freq: Once | INTRAMUSCULAR | Status: DC

## 2017-02-22 MED ORDER — PREDNISONE 20 MG PO TABS: 40.0000 mg | ORAL_TABLET | Freq: Every day | ORAL | 0 refills | Status: AC

## 2017-02-22 MED ORDER — LEVOFLOXACIN 500 MG PO TABS: 500.0000 mg | ORAL_TABLET | Freq: Every day | ORAL | 0 refills | Status: AC

## 2017-02-22 MED ORDER — IPRATROPIUM-ALBUTEROL 0.5-2.5 (3) MG/3ML IN SOLN: 3.0000 mL | Freq: Once | RESPIRATORY_TRACT | Status: DC

## 2017-02-22 NOTE — Progress Notes (Signed)
Subjective: CC: shortness of breath PCP: [name], FNP GLO:VFIEP [name] is a 57 y.o. female presenting to clinic today for:  1. SOB/?COPD exacerbation Patient reports worsening shortness of breath and productive cough over the last week.  She notes that she gets dyspnea on exertion with simple tasks.  She denies orthopnea, lower extremity edema.  She denies hemoptysis.  No known fevers, chills.  She is a daily smoker.  She is not on any controller medications because she notes that she cannot afford it.  2.  Left foot pain Patient reports left foot lump that has been there for "sometime".  She notes that she has been using a compression brace with little improvement in symptoms.  Initially, it was smaller and she had wrapped it which did seem to resolve it.  However, it has come back even larger.  She has never had it drained or looked at.  She does note some discomfort with ambulation secondary to the pressure it applies in the top of her foot.  Denies any foot swelling, discoloration, numbness or tingling.  No preceding injury.   ROS: Per HPI  Allergies  Allergen Reactions  . Codeine Itching  . Meperidine Hcl Other (See Comments)    Makes her feel strange  . Sulfa Antibiotics Other (See Comments)    Joint pain   Past Medical History:  Diagnosis Date  . Anxiety   . Arthritis   . Back pain   . Depression   . Hip pain   . HSV-1 infection   . Hyperlipidemia   . Right leg pain   . Sciatica   . Tobacco user     Current Outpatient Medications:  .  ALPRAZolam (XANAX) 1 MG tablet, Take 1 tablet (1 mg total) 2 (two) times daily as needed by mouth., Disp: 60 tablet, Rfl: 5 .  HYDROcodone-acetaminophen (NORCO/VICODIN) 5-325 MG per tablet, Take 2 tablets by mouth every 4 (four) hours as needed for moderate pain. Neurologist give to patient for back., Disp: , Rfl:  .  budesonide-formoterol (SYMBICORT) 160-4.5 MCG/ACT inhaler, Inhale 2 puffs 2 (two) times daily into the  lungs. (Patient not taking: Reported on 02/22/2017), Disp: 1 Inhaler, Rfl: 3 .  DULERA 200-5 MCG/ACT AERO, Inhale 1 puff into the lungs daily., Disp: , Rfl: 0 .  levofloxacin (LEVAQUIN) 500 MG tablet, Take 1 tablet (500 mg total) by mouth daily for 7 days., Disp: 7 tablet, Rfl: 0 .  predniSONE (DELTASONE) 20 MG tablet, Take 2 tablets (40 mg total) by mouth daily with breakfast for 5 days., Disp: 10 tablet, Rfl: 0 .  PROVENTIL HFA 108 (90 Base) MCG/ACT inhaler, Inhale 2 puffs into the lungs every 6 (six) hours as needed., Disp: , Rfl: 0 .  Vitamin D, Ergocalciferol, (DRISDOL) 50000 units CAPS capsule, TAKE 1 CAPSULE BY MOUTH ONCE WEEKLY FOR 12 WEEKS (Patient not taking: Reported on 02/22/2017), Disp: 12 capsule, Rfl: 0  Current Facility-Administered Medications:  .  ipratropium-albuterol (DUONEB) 0.5-2.5 (3) MG/3ML nebulizer solution 3 mL, 3 mL, Nebulization, Once, Gottschalk, Ashly M, DO .  methylPREDNISolone acetate (DEPO-MEDROL) injection 80 mg, 80 mg, Intramuscular, Once, Ronnie Doss M, DO Social History   Socioeconomic History  . Marital status: Divorced    Spouse name: Not on file  . Number of children: Not on file  . Years of education: Not on file  . Highest education level: Not on file  Social Needs  . Financial resource strain: Not on file  . Food insecurity -  worry: Not on file  . Food insecurity - inability: Not on file  . Transportation needs - medical: Not on file  . Transportation needs - non-medical: Not on file  Occupational History  . Not on file  Tobacco Use  . Smoking status: Current Every Day Smoker    Packs/day: 1.00    Types: Cigarettes    Start date: 01/23/1976  . Smokeless tobacco: Never Used  . Tobacco comment: using the patch  Substance and Sexual Activity  . Alcohol use: No  . Drug use: No  . Sexual activity: Yes  Other Topics Concern  . 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    Family History  Problem Relation Age of Onset  . Brain cancer Mother   . Cancer - Other Father        Abdominal   . Colon cancer Sister   . Colon cancer Paternal Grandmother   . Colon cancer Sister     Objective: Office vital signs reviewed. BP 111/79   Pulse 80   Temp (!) 97 F (36.1 C) (Oral)   Ht 5\' 3"  (1.6 m)   Wt 149 lb (67.6 kg)   SpO2 98%   BMI 26.39 kg/m   Physical Examination:  General: Awake, alert, chronically ill appearing female, appears older than stated age, No acute distress HEENT: Normal    Neck: No masses palpated. No lymphadenopathy    Eyes: PERRLA, extraocular membranes intact, sclera white    Nose: nasal turbinates moist, no nasal discharge    Throat: mucus membranes mildly dry, no erythema, no tonsillar exudate.  Airway is patent Cardio: regular rate and rhythm, S1S2 heard, no murmurs appreciated Pulm: Globally decreased breath sounds with intermittent expiratory wheeze appreciated.  No rhonchi or rales.  Normal work of breathing on room air. Extremities: warm, well perfused, No edema, cyanosis or clubbing; +2 pulses bilaterally MSK: Normal gait and normal station Skin: dry; intact; 3.5 cm x 2.5 cm well-circumscribed, fluctuant lesion on the dorsal aspect of the left foot.  No associated erythema, ecchymosis, induration.  No purulence or exudate.  No central punctum appreciated. Neuro:  light touch sensation grossly intact  Assessment/ Plan: 57 y.o. female   1. COPD with acute exacerbation ([place]) I suspect that she is in exacerbation given reports of increased sputum production.  However, this may very well be presentation of uncontrolled COPD.  She is given a DuoNeb here in office.  Posttreatment lung exam with improved air movement but persistent expiratory wheeze.  We did discuss the importance of smoking cessation and compliance with controller inhaler so as to prevent exacerbations and other complications of COPD.  Will try and get her  plugged into the medication assistance program.  I did recommend that she follow-up in 1 month with PCP to let us know how she is doing on the Symbicort.  I gave her 2 boxes of samples.  She will use 2 puffs twice a day after she has completed prednisone burst.  Prednisone 40 mg p.o. daily by 5 days.  Levaquin 500 mg p.o. daily by 7 days.  Good Rx coupons were provided to the patient for these medications.  She has an albuterol inhaler at home.  I recommend that she uses 2 puffs every 6 hours for the next 2 days then resume as needed as directed use.   - ipratropium-albuterol (DUONEB) 0.5-2.5 (  3) MG/3ML nebulizer solution 3 mL  2. Ganglion cyst of left foot This likely needs to be drained versus excised.  I did discuss consideration for referral to general surgery to have this done.  Given the large nature of this, she would be better served in a surgical office.  She would does not wish to have referral at this time.  For now, may continue compression.  Meds ordered this encounter  Medications  . ipratropium-albuterol (DUONEB) 0.5-2.5 (3) MG/3ML nebulizer solution 3 mL  . DISCONTD: methylPREDNISolone acetate (DEPO-MEDROL) injection 80 mg  . predniSONE (DELTASONE) 20 MG tablet    Sig: Take 2 tablets (40 mg total) by mouth daily with breakfast for 5 days.    Dispense:  10 tablet    Refill:  0  . levofloxacin (LEVAQUIN) 500 MG tablet    Sig: Take 1 tablet (500 mg total) by mouth daily for 7 days.    Dispense:  7 tablet    Refill:  0     Ashly Windell Moulding, DO [place] [phone]

## 2017-02-22 NOTE — Patient Instructions (Signed)
You are given a nebulizer treatment here in office.  I have also given you an inhaler to use once you are done with the oral steroids.  I have given you an antibiotic to use for the next 7 days.  I have also prescribed you steroids to take once daily for the next 5 days.  Make sure you take this in the morning because it can cause you not to sleep well.  Use your albuterol inhaler 2 puffs every 6 hours for the next 2 days.  Then use it only if your wheezing or short of breath.   Chronic Obstructive Pulmonary Disease Exacerbation Chronic obstructive pulmonary disease (COPD) is a common lung problem. In COPD, the flow of air from the lungs is limited. COPD exacerbations are times that breathing gets worse and you need extra treatment. Without treatment they can be life threatening. If they happen often, your lungs can become more damaged. If your COPD gets worse, your doctor may treat you with:  Medicines.  Oxygen.  Different ways to clear your airway, such as using a mask.  Follow these instructions at home:  Do not smoke.  Avoid tobacco smoke and other things that bother your lungs.  If given, take your antibiotic medicine as told. Finish the medicine even if you start to feel better.  Only take medicines as told by your doctor.  Drink enough fluids to keep your pee (urine) clear or pale yellow (unless your doctor has told you not to).  Use a cool mist machine (vaporizer).  If you use oxygen or a machine that turns liquid medicine into a mist (nebulizer), continue to use them as told.  Keep up with shots (vaccinations) as told by your doctor.  Exercise regularly.  Eat healthy foods.  Keep all doctor visits as told. Get help right away if:  You are very short of breath and it gets worse.  You have trouble talking.  You have bad chest pain.  You have blood in your spit (sputum).  You have a fever.  You keep throwing up (vomiting).  You feel weak, or you pass out  (faint).  You feel confused.  You keep getting worse. This information is not intended to replace advice given to you by your health care provider. Make sure you discuss any questions you have with your health care provider. Document Released: 12/28/2010 Document Revised: 06/16/2015 Document Reviewed: 09/12/2012 Elsevier Interactive Patient Education  2017 [employer].

## 2017-02-25 ENCOUNTER — Other Ambulatory Visit: Payer: Self-pay | Admitting: Family

## 2017-03-08 ENCOUNTER — Emergency Department (HOSPITAL_COMMUNITY)
Admission: EM | Admit: 2017-03-08 | Discharge: 2017-03-09 | Disposition: A | Discharge status: Home / Self Care | Payer: Self-pay | Attending: Emergency Medicine | Admitting: Emergency Medicine

## 2017-03-08 ENCOUNTER — Encounter (HOSPITAL_COMMUNITY): Payer: Self-pay | Admitting: *Deleted

## 2017-03-08 ENCOUNTER — Telehealth: Payer: Self-pay | Admitting: Family

## 2017-03-08 ENCOUNTER — Other Ambulatory Visit: Payer: Self-pay

## 2017-03-08 DIAGNOSIS — R109 Unspecified abdominal pain: Secondary | ICD-10-CM

## 2017-03-08 DIAGNOSIS — R110 Nausea: Secondary | ICD-10-CM | POA: Insufficient documentation

## 2017-03-08 DIAGNOSIS — J449 Chronic obstructive pulmonary disease, unspecified: Secondary | ICD-10-CM | POA: Insufficient documentation

## 2017-03-08 DIAGNOSIS — K5900 Constipation, unspecified: Secondary | ICD-10-CM | POA: Insufficient documentation

## 2017-03-08 DIAGNOSIS — F17210 Nicotine dependence, cigarettes, uncomplicated: Secondary | ICD-10-CM | POA: Insufficient documentation

## 2017-03-08 DIAGNOSIS — K5903 Drug induced constipation: Secondary | ICD-10-CM | POA: Insufficient documentation

## 2017-03-08 DIAGNOSIS — T402X5A Adverse effect of other opioids, initial encounter: Secondary | ICD-10-CM | POA: Insufficient documentation

## 2017-03-08 LAB — URINALYSIS, ROUTINE W REFLEX MICROSCOPIC
Bilirubin Urine: NEGATIVE
Glucose, UA: NEGATIVE mg/dL
Ketones, ur: NEGATIVE mg/dL
Leukocytes, UA: NEGATIVE
Nitrite: NEGATIVE
Protein, ur: NEGATIVE mg/dL
Specific Gravity, Urine: 1.002 — ABNORMAL LOW (ref 1.005–1.030)
WBC, UA: NONE SEEN WBC/hpf (ref 0–5)
pH: 8 (ref 5.0–8.0)

## 2017-03-08 LAB — CBC
HCT: 41.9 % (ref 36.0–46.0)
Hemoglobin: 13.9 g/dL (ref 12.0–15.0)
MCH: 30.5 pg (ref 26.0–34.0)
MCHC: 33.2 g/dL (ref 30.0–36.0)
MCV: 91.9 fL (ref 78.0–100.0)
Platelets: 328 10*3/uL (ref 150–400)
RBC: 4.56 MIL/uL (ref 3.87–5.11)
RDW: 14.4 % (ref 11.5–15.5)
WBC: 19.7 10*3/uL — ABNORMAL HIGH (ref 4.0–10.5)

## 2017-03-08 LAB — COMPREHENSIVE METABOLIC PANEL
ALT: 26 U/L (ref 14–54)
AST: 26 U/L (ref 15–41)
Albumin: 3.8 g/dL (ref 3.5–5.0)
Alkaline Phosphatase: 77 U/L (ref 38–126)
Anion gap: 9 (ref 5–15)
BUN: 5 mg/dL — ABNORMAL LOW (ref 6–20)
CO2: 24 mmol/L (ref 22–32)
Calcium: 9.1 mg/dL (ref 8.9–10.3)
Chloride: 103 mmol/L (ref 101–111)
Creatinine, Ser: 0.72 mg/dL (ref 0.44–1.00)
GFR calc Af Amer: >60 mL/min (ref >60)
GFR calc non Af Amer: >60 mL/min (ref >60)
Glucose, Bld: 111 mg/dL — ABNORMAL HIGH (ref 65–99)
Potassium: 4 mmol/L (ref 3.5–5.1)
Sodium: 136 mmol/L (ref 135–145)
Total Bilirubin: 0.6 mg/dL (ref 0.3–1.2)
Total Protein: 6.7 g/dL (ref 6.5–8.1)

## 2017-03-08 LAB — LIPASE, BLOOD: Lipase: 26 U/L (ref 11–51)

## 2017-03-08 MED ORDER — IOPAMIDOL (ISOVUE-300) INJECTION 61%
INTRAVENOUS | Status: AC
  Administered 2017-03-09: 100 mL
  Filled 2017-03-08: qty 100

## 2017-03-08 NOTE — Telephone Encounter (Signed)
Please advise on patient's constipation.

## 2017-03-08 NOTE — ED Triage Notes (Signed)
The pt has had constipation for one week she took meds and felt like she was cleared then she became constipated again.  This time she has had difficulty voiding and she has spasms in her rectun whenever she attempts to have a bm

## 2017-03-08 NOTE — Telephone Encounter (Signed)
Pt has not had a BM since last Sunday. She states she has taken the milk of magnesium with no relief has tried using her finger to get some out with no relief. Pt also states she is having trouble urinating as well

## 2017-03-08 NOTE — ED Notes (Signed)
Pt remains in waiting room. Updated on wait for treatment room. 

## 2017-03-08 NOTE — ED Notes (Signed)
Tech came for room pt in bathroom

## 2017-03-08 NOTE — ED Provider Notes (Signed)
[facility] EMERGENCY DEPARTMENT Provider Note   CSN: [id number] Arrival date & time: 03/08/17  1743     History   Chief Complaint Chief Complaint  Patient presents with  . Abdominal Pain    HPI [name] is a 57 y.o. female.   The history is provided by the patient.  She has history of hyperlipidemia, COPD, chronic back pain on chronic narcotic use and comes in because of abdominal pain and constipation.  She states that last week she had been constipated and took a laxative which took 12 hours to work.  However, she had a large bowel movement 5 days ago and was feeling much better.  However, she had not had a bowel movement since then.  She has a sense of spasm in her rectum whenever she tries to move her bowels.  There has been associated nausea but no vomiting.  She rated pain at 10/10.  She did take a dose of milk of magnesia earlier today and it did not seem to work.  However, while waiting in the waiting room, she did have a large bowel movement and states she feels 100% better.  She still states there is some pain across her lower abdomen, but she is not able to put a number to it.  Past Medical History:  Diagnosis Date  . Anxiety   . Arthritis   . Back pain   . Depression   . Hip pain   . HSV-1 infection   . Hyperlipidemia   . Right leg pain   . Sciatica   . Tobacco user     Patient Active Problem List   Diagnosis Date Noted  . COPD (chronic obstructive pulmonary disease) ([place]) 12/10/2016  . HSV-1 infection   . Tobacco user   . Anxiety   . Depression   . Hyperlipidemia   . Sciatica   . Back pain   . Right leg pain   . Hip pain   . Postmenopausal atrophic vaginitis 04/15/2012  . Pelvic pain in female 04/15/2012  . HEMORRHAGE OF RECTUM AND ANUS 06/16/2009    Past Surgical History:  Procedure Laterality Date  . BUNIONECTOMY    . CATARACT EXTRACTION    . CESAREAN SECTION    . COLONOSCOPY W/ POLYPECTOMY    . ELBOW SURGERY    .  NOVASURE ABLATION    . RHYTIDECTOMY NECK / CHEEK / CHIN    . TONSILLECTOMY    . TUBAL LIGATION      OB History    Gravida Para Term Preterm AB Living   3 2 2  0 1 2   SAB TAB Ectopic Multiple Live Births   0 1 0 0         Home Medications    Prior to Admission medications   Medication Sig Start Date End Date Taking? Authorizing Provider  ALPRAZolam Duanne Moron) 1 MG tablet Take 1 tablet (1 mg total) 2 (two) times daily as needed by mouth. 12/10/16   [name], FNP  budesonide-formoterol (SYMBICORT) 160-4.5 MCG/ACT inhaler Inhale 2 puffs 2 (two) times daily into the lungs. Patient not taking: Reported on 02/22/2017 12/10/16   [name] A, FNP  DULERA 200-5 MCG/ACT AERO Inhale 1 puff into the lungs daily. 02/13/17   [provider]  HYDROcodone-acetaminophen (NORCO/VICODIN) 5-325 MG per tablet Take 2 tablets by mouth every 4 (four) hours as needed for moderate pain. Neurologist give to patient for back.    [provider]  PROVENTIL HFA 108 (90 Base) MCG/ACT inhaler Inhale 2 puffs into the lungs every 6 (six) hours as needed. 02/13/17   [provider]  Vitamin D, Ergocalciferol, (DRISDOL) 50000 units CAPS capsule TAKE 1 CAPSULE BY MOUTH ONCE WEEKLY FOR 12 WEEKS 02/25/17   Dettinger, Fransisca Kaufmann, MD    Family History Family History  Problem Relation Age of Onset  . Brain cancer Mother   . Cancer - Other Father        Abdominal   . Colon cancer Sister   . Colon cancer Paternal Grandmother   . Colon cancer Sister     Social History Social History   Tobacco Use  . Smoking status: Current Every Day Smoker    Packs/day: 1.00    Types: Cigarettes    Start date: 01/23/1976  . Smokeless tobacco: Never Used  . Tobacco comment: using the patch  Substance Use Topics  . Alcohol use: No  . Drug use: No     Allergies   Codeine; Meperidine hcl; and Sulfa antibiotics   Review of Systems Review of Systems  All other systems reviewed and are  negative.    Physical Exam Updated Vital Signs BP 114/63   Pulse 70   Temp 98.5 F (36.9 C)   Resp 18   Ht 5\' 3"  (1.6 m)   Wt 67.1 kg (148 lb)   SpO2 100%   BMI 26.22 kg/m   Physical Exam  Nursing note and vitals reviewed.  57 year old female, resting comfortably and in no acute distress. Vital signs are normal. Oxygen saturation is 100%, which is normal. Head is normocephalic and atraumatic. PERRLA, EOMI. Oropharynx is clear. Neck is nontender and supple without adenopathy or JVD. Back is nontender and there is no CVA tenderness. Lungs are clear without rales, wheezes, or rhonchi. Chest is nontender. Heart has regular rate and rhythm without murmur. Abdomen is soft, flat, with mild to moderate tenderness across the lower abdomen.  There is no rebound or guarding.  There are no masses or hepatosplenomegaly and peristalsis is slightly hypoactive. Extremities have no cyanosis or edema, full range of motion is present. Skin is warm and dry without rash. Neurologic: Mental status is normal, cranial nerves are intact, there are no motor or sensory deficits.  ED Treatments / Results  Labs (all labs ordered are listed, but only abnormal results are displayed) Labs Reviewed  COMPREHENSIVE METABOLIC PANEL - Abnormal; Notable for the following components:      Result Value   Glucose, Bld 111 (*)    BUN 5 (*)    All other components within normal limits  CBC - Abnormal; Notable for the following components:   WBC 19.7 (*)    All other components within normal limits  URINALYSIS, ROUTINE W REFLEX MICROSCOPIC - Abnormal; Notable for the following components:   Color, Urine STRAW (*)    Specific Gravity, Urine 1.002 (*)    Hgb urine dipstick MODERATE (*)    Bacteria, UA RARE (*)    Squamous Epithelial / LPF 0-5 (*)    All other components within normal limits  LIPASE, BLOOD    Radiology Ct Abdomen Pelvis W Contrast  Result Date: 03/09/2017 CLINICAL DATA:  Abdominal pain.   Patient reports constipation. EXAM: CT ABDOMEN AND PELVIS WITH CONTRAST TECHNIQUE: Multidetector CT imaging of the abdomen and pelvis was performed using the standard protocol following bolus administration of intravenous contrast. CONTRAST:  100 cc ISOVUE-300 IOPAMIDOL (ISOVUE-300) INJECTION 61% COMPARISON:  Noncontrast CT  04/22/2007 FINDINGS: Lower chest: Mild hypoventilatory atelectasis.  Small hiatal hernia. Hepatobiliary: No focal liver abnormality is seen. No gallstones, gallbladder wall thickening, or biliary dilatation. Pancreas: No ductal dilatation or inflammation. Spleen: Normal in size without focal abnormality. Adrenals/Urinary Tract: Normal adrenal glands. Mild prominence of the right ureter and renal collecting system without frank hydronephrosis. Homogeneous renal enhancement with symmetric excretion on delayed phase imaging. Tiny subcentimeter hypodensity in the posterior mid left kidney is too small to characterize. Urinary bladder is distended (volume = 640 cm^3) without wall thickening. Stomach/Bowel: Mild distal descending colonic diverticulosis without diverticulitis. Moderate stool in the ascending and transverse colon. No colonic wall thickening or inflammation. Normal appendix. Small bowel is decompressed. Small hiatal hernia, stomach is unremarkable. No bowel obstruction. Vascular/Lymphatic: Mild aorta bi-iliac atherosclerosis. Circumaortic left renal vein. No enlarged abdominal or pelvic lymph nodes. Reproductive: Uterus and bilateral adnexa are unremarkable. Other: No free air, free fluid, or intra-abdominal fluid collection. Musculoskeletal: There are no acute or suspicious osseous abnormalities. IMPRESSION: 1. Bladder distention without wall thickening or focal abnormality, question urinary retention. 2. No other acute abnormality in the abdomen/pelvis. 3. Small hiatal hernia. 4.  Aortic Atherosclerosis (ICD10-I70.0). Electronically Signed   By: Jeb Levering M.D.   On: 03/09/2017  00:37    Procedures Procedures   Medications Ordered in ED Medications  magnesium citrate solution 1 Bottle (not administered)  iopamidol (ISOVUE-300) 61 % injection (100 mLs  Contrast Given 03/09/17 0005)     Initial Impression / Assessment and Plan / ED Course  I have reviewed the triage vital signs and the nursing notes.  Pertinent labs & imaging results that were available during my care of the patient were reviewed by me and considered in my medical decision making (see chart for details).  Abdominal pain with constipation.  Constipation likely related to chronic narcotic use.  Symptoms much improved following bowel movement while in the waiting room.  However, laboratory workup does show very high WBC of 19.7.  Review of old records she has no relevant past visits, but prior WBC in 2015 had been elevated to 14.5.  Given her high WBC and persistent abdominal tenderness, she will be sent for CT of abdomen and pelvis to make sure there is no other pathology going on.   CT shows no acute process, but still fairly substantial stool burden.  Patient states that she continues to have spasms when she tries to move her bowels and is reluctant to strain.  I feel she would benefit from further evacuation of her bowels so she is discharged with a dose of magnesium citrate.  Advised to use over-the-counter MiraLAX as needed.  Follow-up with PCP.  Return precautions discussed.  Final Clinical Impressions(s) / ED Diagnoses   Final diagnoses:  Therapeutic opioid induced constipation  Abdominal pain, unspecified abdominal location    ED Discharge Orders    None       Delora Fuel, MD 91/63/84 [id number]

## 2017-03-09 ENCOUNTER — Emergency Department (HOSPITAL_COMMUNITY): Payer: Self-pay

## 2017-03-09 ENCOUNTER — Encounter (HOSPITAL_COMMUNITY): Payer: Self-pay | Admitting: Radiology

## 2017-03-09 MED ORDER — MAGNESIUM CITRATE PO SOLN
1.0000 | Freq: Once | ORAL | Status: AC
  Administered 2017-03-09: 1 via ORAL
  Filled 2017-03-09: qty 296

## 2017-03-09 NOTE — Discharge Instructions (Signed)
Use Miralax as needed for constipation. If you go two days without a bowel movement, take Magnesium Citrate. Return if symptoms are getting worse.

## 2017-03-09 NOTE — ED Notes (Signed)
Pt to ct 

## 2017-03-09 NOTE — ED Notes (Signed)
The pt returned from c-t  Up to br no assistance needed

## 2017-03-11 ENCOUNTER — Telehealth: Payer: Self-pay | Admitting: Family

## 2017-03-11 MED ORDER — LINACLOTIDE 145 MCG PO CAPS: 145.0000 ug | ORAL_CAPSULE | Freq: Every day | ORAL | 3 refills | Status: DC

## 2017-03-11 NOTE — Telephone Encounter (Signed)
I see patient went to ED for issues. I will send in [place] Prescription to pharmacy. This is a pill she will take daily to prevent opioid induced constipation. Force fluids. Pt will need follow up to recheck WBC if her abd pain continues.

## 2017-03-11 NOTE — Telephone Encounter (Signed)
Patient aware and verbalizes understanding. 

## 2017-03-11 NOTE — Telephone Encounter (Signed)
FYI for provider. No response necessary. 

## 2017-03-26 ENCOUNTER — Ambulatory Visit: Payer: Self-pay | Admitting: Family

## 2017-03-28 ENCOUNTER — Encounter: Payer: Self-pay | Admitting: Family

## 2017-03-28 ENCOUNTER — Ambulatory Visit (INDEPENDENT_AMBULATORY_CARE_PROVIDER_SITE_OTHER): Payer: Self-pay | Admitting: Family

## 2017-03-28 VITALS — BP 119/82 | HR 74 | Temp 97.5°F | Ht 63.0 in | Wt 147.6 lb

## 2017-03-28 DIAGNOSIS — K047 Periapical abscess without sinus: Secondary | ICD-10-CM

## 2017-03-28 DIAGNOSIS — I700 Atherosclerosis of aorta: Secondary | ICD-10-CM

## 2017-03-28 DIAGNOSIS — F419 Anxiety disorder, unspecified: Secondary | ICD-10-CM

## 2017-03-28 DIAGNOSIS — G8929 Other chronic pain: Secondary | ICD-10-CM

## 2017-03-28 DIAGNOSIS — J410 Simple chronic bronchitis: Secondary | ICD-10-CM

## 2017-03-28 DIAGNOSIS — M5442 Lumbago with sciatica, left side: Secondary | ICD-10-CM

## 2017-03-28 DIAGNOSIS — K5903 Drug induced constipation: Secondary | ICD-10-CM

## 2017-03-28 DIAGNOSIS — M5441 Lumbago with sciatica, right side: Secondary | ICD-10-CM

## 2017-03-28 DIAGNOSIS — T402X5A Adverse effect of other opioids, initial encounter: Secondary | ICD-10-CM

## 2017-03-28 DIAGNOSIS — Z720 Tobacco use: Secondary | ICD-10-CM

## 2017-03-28 DIAGNOSIS — D72829 Elevated white blood cell count, unspecified: Secondary | ICD-10-CM

## 2017-03-28 MED ORDER — AMOXICILLIN-POT CLAVULANATE 875-125 MG PO TABS: 1.0000 | ORAL_TABLET | Freq: Two times a day (BID) | ORAL | 0 refills | Status: DC

## 2017-03-28 MED ORDER — ALPRAZOLAM 1 MG PO TABS: 1.0000 mg | ORAL_TABLET | Freq: Two times a day (BID) | ORAL | 5 refills | Status: DC | PRN

## 2017-03-28 MED ORDER — BUSPIRONE HCL 15 MG PO TABS: 15.0000 mg | ORAL_TABLET | Freq: Three times a day (TID) | ORAL | 1 refills | Status: DC

## 2017-03-28 NOTE — Progress Notes (Signed)
Subjective:    Patient ID: [name], female    DOB: 05-Jun-1960, 57 y.o.   MRN: [id number]  PT presents to the office today for chronic follow and GAD.  Pt has tried Celexa and Prozac and states makes her feel "strange" and can not tolerate. Pt continues her Xanax 1 mg BID.   Pt is followed by Neurosurgeon for chronic back pain every every 3 months stable.   PT complaining of dental pain of 4 out 10. States she can not brush her teeth or eat without a pain of a 10 out 10.  Pt went to the ED two weeks for chronic constipation. Pt had elevated WBC.  Anxiety  Presents for follow-up visit. Symptoms include depressed mood, excessive worry, irritability, nervous/anxious behavior and restlessness. Symptoms occur occasionally. The severity of symptoms is moderate. The quality of sleep is good.    Constipation  This is a chronic problem. The current episode started more than 1 year ago. The problem has been rapidly worsening since onset. Her stool frequency is 1 time per day. She has tried fiber, laxatives and stool softeners for the symptoms. The treatment provided mild relief.  COPD PT states she can not afford the inhalers that was prescribed to her. PT smoking about 10 cigarettes a day.     Review of Systems  Constitutional: Positive for irritability.  Gastrointestinal: Positive for constipation.  Psychiatric/Behavioral: The patient is nervous/anxious.   All other systems reviewed and are negative.      Objective:   Physical Exam  Constitutional: She is oriented to person, place, and time. She appears well-developed and well-nourished. No distress.  HENT:  Head: Normocephalic and atraumatic.  Right Ear: External ear normal.  Mouth/Throat: Posterior oropharyngeal erythema present.  Eyes: Pupils are equal, round, and reactive to light.  Neck: Normal range of motion. Neck supple. No thyromegaly present.  Cardiovascular: Normal rate, regular rhythm, normal heart sounds and  intact distal pulses.  No murmur heard. Pulmonary/Chest: Effort normal. No respiratory distress. She has decreased breath sounds. She has no wheezes.  Abdominal: Soft. Bowel sounds are normal. She exhibits no distension. There is no tenderness.  Musculoskeletal: Normal range of motion. She exhibits no edema or tenderness.  Neurological: She is alert and oriented to person, place, and time.  Skin: Skin is warm and dry.  Psychiatric: She has a normal mood and affect. Her behavior is normal. Judgment and thought content normal.  Vitals reviewed.     BP 119/82   Pulse 74   Temp (!) 97.5 F (36.4 C) (Oral)   Ht 5\' 3"  (1.6 m)   Wt 147 lb 9.6 oz (67 kg)   BMI 26.15 kg/m      Assessment & Plan:  1. Simple chronic bronchitis ([place])  2. Anxiety Will add Buspar today Stress management  - ALPRAZolam (XANAX) 1 MG tablet; Take 1 tablet (1 mg total) by mouth 2 (two) times daily as needed for anxiety.  Dispense: 60 tablet; Refill: 5 - busPIRone (BUSPAR) 15 MG tablet; Take 1 tablet (15 mg total) by mouth 3 (three) times daily.  Dispense: 90 tablet; Refill: 1  3. Chronic bilateral low back pain with bilateral sciatica Keep appt with pain clinic  4. Tobacco user Smoking cessation discussed  5. Aortic atherosclerosis ([place])   6. Dental abscess Keep clean Follow up with dentists appt  - amoxicillin-clavulanate (AUGMENTIN) 875-125 MG tablet; Take 1 tablet by mouth 2 (two) times daily.  Dispense: 14 tablet; Refill: 0  7. Leukocytosis, unspecified type Pt states her disability is "about to go through" and wants to hold off on any lab work today   8. Constipation due to opioid therapy Start Miralax  Daily  Force fluids Increase fiber and healthy eating Encourage exercise   [name], FNP

## 2017-03-28 NOTE — Patient Instructions (Signed)

## 2017-04-18 ENCOUNTER — Ambulatory Visit (INDEPENDENT_AMBULATORY_CARE_PROVIDER_SITE_OTHER): Payer: Self-pay

## 2017-04-18 ENCOUNTER — Encounter: Payer: Self-pay | Admitting: Family

## 2017-04-18 ENCOUNTER — Ambulatory Visit (INDEPENDENT_AMBULATORY_CARE_PROVIDER_SITE_OTHER): Payer: Self-pay | Admitting: Family

## 2017-04-18 VITALS — BP 100/66 | HR 68 | Temp 97.5°F | Ht 63.0 in | Wt 151.0 lb

## 2017-04-18 DIAGNOSIS — R1030 Lower abdominal pain, unspecified: Secondary | ICD-10-CM

## 2017-04-18 DIAGNOSIS — K5792 Diverticulitis of intestine, part unspecified, without perforation or abscess without bleeding: Secondary | ICD-10-CM

## 2017-04-18 LAB — MICROSCOPIC EXAMINATION
Epithelial Cells (non renal): >10 /hpf — AB (ref 0–10)
Renal Epithel, UA: NONE SEEN /hpf

## 2017-04-18 LAB — URINALYSIS, COMPLETE
Bilirubin, UA: NEGATIVE
Glucose, UA: NEGATIVE
Ketones, UA: NEGATIVE
Leukocytes, UA: NEGATIVE
Nitrite, UA: NEGATIVE
Protein, UA: NEGATIVE
Specific Gravity, UA: 1.015 (ref 1.005–1.030)
Urobilinogen, Ur: 0.2 mg/dL (ref 0.2–1.0)
pH, UA: 6 (ref 5.0–7.5)

## 2017-04-18 MED ORDER — CIPROFLOXACIN HCL 500 MG PO TABS: 500.0000 mg | ORAL_TABLET | Freq: Two times a day (BID) | ORAL | 0 refills | Status: DC

## 2017-04-18 MED ORDER — METRONIDAZOLE 500 MG PO TABS: 500.0000 mg | ORAL_TABLET | Freq: Three times a day (TID) | ORAL | 0 refills | Status: DC

## 2017-04-18 NOTE — Progress Notes (Signed)
   Subjective:    Patient ID: [name], female    DOB: 1960/08/14, 57 y.o.   MRN: [id number]  Abdominal Pain  This is a new problem. The current episode started yesterday. The onset quality is sudden. The problem occurs constantly. The problem has been gradually worsening. The pain is located in the LLQ. The pain is at a severity of 8/10. The pain is moderate. The quality of the pain is aching. The abdominal pain does not radiate. Associated symptoms include belching, constipation and flatus. Pertinent negatives include no dysuria, nausea or vomiting. The pain is aggravated by palpation. She has tried nothing for the symptoms. The treatment provided no relief.      Review of Systems  Gastrointestinal: Positive for abdominal pain, constipation and flatus. Negative for nausea and vomiting.  Genitourinary: Negative for dysuria.  All other systems reviewed and are negative.      Objective:   Physical Exam  Constitutional: She is oriented to person, place, and time. She appears well-developed and well-nourished. No distress.  HENT:  Head: Normocephalic and atraumatic.  Right Ear: External ear normal.  Left Ear: External ear normal.  Nose: Nose normal.  Mouth/Throat: Oropharynx is clear and moist.  Eyes: Pupils are equal, round, and reactive to light.  Neck: Normal range of motion. Neck supple. No thyromegaly present.  Cardiovascular: Normal rate, regular rhythm, normal heart sounds and intact distal pulses.  No murmur heard. Pulmonary/Chest: Effort normal and breath sounds normal. No respiratory distress. She has no wheezes.  Abdominal: Soft. Bowel sounds are normal. She exhibits no distension. There is tenderness (LLQ). There is guarding.  Musculoskeletal: Normal range of motion. She exhibits no edema or tenderness.  Neurological: She is alert and oriented to person, place, and time.  Skin: Skin is warm and dry.  Psychiatric: She has a normal mood and affect. Her behavior is  normal. Judgment and thought content normal.  Vitals reviewed.  KUB- Negative Preliminary reading by [name], FNP [facility]    BP 100/66   Pulse 68   Temp (!) 97.5 F (36.4 C) (Oral)   Ht 5\' 3"  (1.6 m)   Wt 151 lb (68.5 kg)   BMI 26.75 kg/m      Assessment & Plan:  1. Lower abdominal pain - Urinalysis, Complete - DG Abd 1 View; Future  2. Diverticulitis If abdominal pain worsens go to ED Start Flagyl and Cipro today Full liquid diet CBC pending - metroNIDAZOLE (FLAGYL) 500 MG tablet; Take 1 tablet (500 mg total) by mouth 3 (three) times daily.  Dispense: 21 tablet; Refill: 0 - ciprofloxacin (CIPRO) 500 MG tablet; Take 1 tablet (500 mg total) by mouth 2 (two) times daily.  Dispense: 14 tablet; Refill: 0   [name], FNP

## 2017-04-18 NOTE — Patient Instructions (Signed)
Diverticulitis °Diverticulitis is infection or inflammation of small pouches (diverticula) in the colon that form due to a condition called diverticulosis. Diverticula can trap stool (feces) and bacteria, causing infection and inflammation. °Diverticulitis may cause severe stomach pain and diarrhea. It may lead to tissue damage in the colon that causes bleeding. The diverticula may also burst (rupture) and cause infected stool to enter other areas of the abdomen. °Complications of diverticulitis can include: °· Bleeding. °· Severe infection. °· Severe pain. °· Rupture (perforation) of the colon. °· Blockage (obstruction) of the colon. ° °What are the causes? °This condition is caused by stool becoming trapped in the diverticula, which allows bacteria to grow in the diverticula. This leads to inflammation and infection. °What increases the risk? °You are more likely to develop this condition if: °· You have diverticulosis. The risk for diverticulosis increases if: °? You are overweight or obese. °? You use tobacco products. °? You do not get enough exercise. °· You eat a diet that does not include enough fiber. High-fiber foods include fruits, vegetables, beans, nuts, and whole grains. ° °What are the signs or symptoms? °Symptoms of this condition may include: °· Pain and tenderness in the abdomen. The pain is normally located on the left side of the abdomen, but it may occur in other areas. °· Fever and chills. °· Bloating. °· Cramping. °· Nausea. °· Vomiting. °· Changes in bowel routines. °· Blood in your stool. ° °How is this diagnosed? °This condition is diagnosed based on: °· Your medical history. °· A physical exam. °· Tests to make sure there is nothing else causing your condition. These tests may include: °? Blood tests. °? Urine tests. °? Imaging tests of the abdomen, including X-rays, ultrasounds, MRIs, or CT scans. ° °How is this treated? °Most cases of this condition are mild and can be treated at home.  Treatment may include: °· Taking over-the-counter pain medicines. °· Following a clear liquid diet. °· Taking antibiotic medicines by mouth. °· Rest. ° °More severe cases may need to be treated at a hospital. Treatment may include: °· Not eating or drinking. °· Taking prescription pain medicine. °· Receiving antibiotic medicines through an IV tube. °· Receiving fluids and nutrition through an IV tube. °· Surgery. ° °When your condition is under control, your health care provider may recommend that you have a colonoscopy. This is an exam to look at the entire large intestine. During the exam, a lubricated, bendable tube is inserted into the anus and then passed into the rectum, colon, and other parts of the large intestine. A colonoscopy can show how severe your diverticula are and whether something else may be causing your symptoms. °Follow these instructions at home: °Medicines °· Take over-the-counter and prescription medicines only as told by your health care provider. These include fiber supplements, probiotics, and stool softeners. °· If you were prescribed an antibiotic medicine, take it as told by your health care provider. Do not stop taking the antibiotic even if you start to feel better. °· Do not drive or use heavy machinery while taking prescription pain medicine. °General instructions °· Follow a full liquid diet or another diet as directed by your health care provider. After your symptoms improve, your health care provider may tell you to change your diet. He or she may recommend that you eat a diet that contains at least 25 g (25 grams) of fiber daily. Fiber makes it easier to pass stool. Healthy sources of fiber include: °? Berries. One cup   contains 4-8 grams of fiber. °? Beans or lentils. One half cup contains 5-8 grams of fiber. °? Green vegetables. One cup contains 4 grams of fiber. °· Exercise for at least 30 minutes, 3 times each week. You should exercise hard enough to raise your heart rate and  break a sweat. °· Keep all follow-up visits as told by your health care provider. This is important. You may need a colonoscopy. °Contact a health care provider if: °· Your pain does not improve. °· You have a hard time drinking or eating food. °· Your bowel movements do not return to normal. °Get help right away if: °· Your pain gets worse. °· Your symptoms do not get better with treatment. °· Your symptoms suddenly get worse. °· You have a fever. °· You vomit more than one time. °· You have stools that are bloody, black, or tarry. °Summary °· Diverticulitis is infection or inflammation of small pouches (diverticula) in the colon that form due to a condition called diverticulosis. Diverticula can trap stool (feces) and bacteria, causing infection and inflammation. °· You are at higher risk for this condition if you have diverticulosis and you eat a diet that does not include enough fiber. °· Most cases of this condition are mild and can be treated at home. More severe cases may need to be treated at a hospital. °· When your condition is under control, your health care provider may recommend that you have an exam called a colonoscopy. This exam can show how severe your diverticula are and whether something else may be causing your symptoms. °This information is not intended to replace advice given to you by your health care provider. Make sure you discuss any questions you have with your health care provider. °Document Released: 10/18/2004 Document Revised: 02/11/2016 Document Reviewed: 02/11/2016 °Elsevier Interactive Patient Education © 2018 [employer] ° °

## 2017-04-19 LAB — CBC WITH DIFFERENTIAL/PLATELET
Basophils Absolute: 0 10*3/uL (ref 0.0–0.2)
Basos: 0 %
EOS (ABSOLUTE): 0.1 10*3/uL (ref 0.0–0.4)
Eos: 0 %
Hematocrit: 38.9 % (ref 34.0–46.6)
Hemoglobin: 13.2 g/dL (ref 11.1–15.9)
Immature Grans (Abs): 0 10*3/uL (ref 0.0–0.1)
Immature Granulocytes: 0 %
Lymphocytes Absolute: 3.7 10*3/uL — ABNORMAL HIGH (ref 0.7–3.1)
Lymphs: 24 %
MCH: 30.8 pg (ref 26.6–33.0)
MCHC: 33.9 g/dL (ref 31.5–35.7)
MCV: 91 fL (ref 79–97)
Monocytes Absolute: 0.9 10*3/uL (ref 0.1–0.9)
Monocytes: 6 %
Neutrophils Absolute: 10.3 10*3/uL — ABNORMAL HIGH (ref 1.4–7.0)
Neutrophils: 70 %
Platelets: 336 10*3/uL (ref 150–379)
RBC: 4.28 x10E6/uL (ref 3.77–5.28)
RDW: 14.8 % (ref 12.3–15.4)
WBC: 15 10*3/uL — ABNORMAL HIGH (ref 3.4–10.8)

## 2017-04-23 ENCOUNTER — Encounter: Payer: Self-pay | Admitting: Family Medicine

## 2017-04-23 ENCOUNTER — Ambulatory Visit (INDEPENDENT_AMBULATORY_CARE_PROVIDER_SITE_OTHER): Payer: Self-pay | Admitting: Family Medicine

## 2017-04-23 VITALS — BP 109/82 | HR 69 | Temp 97.6°F | Ht 63.0 in | Wt 151.0 lb

## 2017-04-23 DIAGNOSIS — R1032 Left lower quadrant pain: Secondary | ICD-10-CM

## 2017-04-23 NOTE — Patient Instructions (Signed)
We reviewed your abdominal x-ray and abdominal CT scan.  Because your pain seems to be worsening and you have been vomiting despite use of antibiotic treatment for diverticulitis, I have recommended that you consider being evaluated in the emergency department.  You may need to have repeat imaging studies.   Diverticulitis Diverticulitis is when small pockets in your large intestine (colon) get infected or swollen. This causes stomach pain and watery poop (diarrhea). These pouches are called diverticula. They form in people who have a condition called diverticulosis. Follow these instructions at home: Medicines  Take over-the-counter and prescription medicines only as told by your doctor. These include: ? Antibiotics. ? Pain medicines. ? Fiber pills. ? Probiotics. ? Stool softeners.  Do not drive or use heavy machinery while taking prescription pain medicine.  If you were prescribed an antibiotic, take it as told. Do not stop taking it even if you feel better. General instructions  Follow a diet as told by your doctor.  When you feel better, your doctor may tell you to change your diet. You may need to eat a lot of fiber. Fiber makes it easier to poop (have bowel movements). Healthy foods with fiber include: ? Berries. ? Beans. ? Lentils. ? Green vegetables.  Exercise 3 or more times a week. Aim for 30 minutes each time. Exercise enough to sweat and make your heart beat faster.  Keep all follow-up visits as told. This is important. You may need to have an exam of the large intestine. This is called a colonoscopy. Contact a doctor if:  Your pain does not get better.  You have a hard time eating or drinking.  You are not pooping like normal. Get help right away if:  Your pain gets worse.  Your problems do not get better.  Your problems get worse very fast.  You have a fever.  You throw up (vomit) more than one time.  You have poop that  is: ? Bloody. ? Black. ? Tarry. Summary  Diverticulitis is when small pockets in your large intestine (colon) get infected or swollen.  Take medicines only as told by your doctor.  Follow a diet as told by your doctor. This information is not intended to replace advice given to you by your health care provider. Make sure you discuss any questions you have with your health care provider. Document Released: 06/27/2007 Document Revised: 01/26/2016 Document Reviewed: 01/26/2016 Elsevier Interactive Patient Education  2017 [employer].

## 2017-04-23 NOTE — Progress Notes (Signed)
Subjective: CC:LLQ abdominal pain PCP: [name], FNP DGL:OVFIE [name] is a 57 y.o. female presenting to clinic today for:  1.  LLQ abdominal pain Patient was seen on 04/10/2017 for left lower quadrant abdominal pain.  She notes that she has been compliant with both the Flagyl and Cipro but is not felt that the left lower quadrant pain has improved.  She notes that on Saturday she had nonbloody, nonbilious vomiting that has since resolved.  She reports that her small dog jumped on her stomach and this caused extreme discomfort.  She is tolerating p.o. intake without difficulty.  Denies hematochezia, melena, fevers, chills.  Denies dysuria, gross hematuria.  She worries about possible colon cancer given her family history of an undiagnosed colon cancer in a young relative.  She notes that she has not had a colonoscopy in almost 5 years.  The last colonoscopy noted to have polyps which required removal.  She notes that she is uninsured and this is the reason she has not had a repeat screening colonoscopy.   ROS: Per HPI  Allergies  Allergen Reactions  . Codeine Itching  . Gabapentin     Other reaction(s): reports aunt has seizure dx and states she can&#39;Julie take medication because of it  . Meperidine Hcl Other (See Comments)    Makes her feel strange  . Sulfa Antibiotics Other (See Comments)    Joint pain   Past Medical History:  Diagnosis Date  . Anxiety   . Arthritis   . Back pain   . Depression   . Hip pain   . HSV-1 infection   . Hyperlipidemia   . Right leg pain   . Sciatica   . Tobacco user     Current Outpatient Medications:  .  ALPRAZolam (XANAX) 1 MG tablet, Take 1 tablet (1 mg total) by mouth 2 (two) times daily as needed for anxiety., Disp: 60 tablet, Rfl: 5 .  ciprofloxacin (CIPRO) 500 MG tablet, Take 1 tablet (500 mg total) by mouth 2 (two) times daily., Disp: 14 tablet, Rfl: 0 .  DULERA 200-5 MCG/ACT AERO, Inhale 1 puff into the lungs daily., Disp: ,  Rfl: 0 .  HYDROcodone-acetaminophen (NORCO/VICODIN) 5-325 MG per tablet, Take 2 tablets by mouth every 4 (four) hours as needed for moderate pain. Neurologist give to patient for back., Disp: , Rfl:  .  metroNIDAZOLE (FLAGYL) 500 MG tablet, Take 1 tablet (500 mg total) by mouth 3 (three) times daily., Disp: 21 tablet, Rfl: 0 .  PROVENTIL HFA 108 (90 Base) MCG/ACT inhaler, Inhale 2 puffs into the lungs every 6 (six) hours as needed for wheezing. , Disp: , Rfl: 0 Social History   Socioeconomic History  . Marital status: Divorced    Spouse name: Not on file  . Number of children: Not on file  . Years of education: Not on file  . Highest education level: Not on file  Occupational History  . Not on file  Social Needs  . Financial resource strain: Not on file  . Food insecurity:    Worry: Not on file    Inability: Not on file  . Transportation needs:    Medical: Not on file    Non-medical: Not on file  Tobacco Use  . Smoking status: Current Every Day Smoker    Packs/day: 1.00    Types: Cigarettes    Start date: 01/23/1976  . Smokeless tobacco: Never Used  . Tobacco comment: using the patch  Substance and  Sexual Activity  . Alcohol use: No  . Drug use: No  . Sexual activity: Yes  Lifestyle  . Physical activity:    Days per week: Not on file    Minutes per session: Not on file  . Stress: Not on file  Relationships  . Social connections:    Talks on phone: Not on file    Gets together: Not on file    Attends religious service: Not on file    Active member of club or organization: Not on file    Attends meetings of clubs or organizations: Not on file    Relationship status: Not on file  . Intimate partner violence:    Fear of current or ex partner: Not on file    Emotionally abused: Not on file    Physically abused: Not on file    Forced sexual activity: Not on file  Other Topics Concern  . 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    Family History  Problem Relation Age of Onset  . Brain cancer Mother   . Cancer - Other Father        Abdominal   . Colon cancer Sister   . Colon cancer Paternal Grandmother   . Colon cancer Sister     Objective: Office vital signs reviewed. BP 109/82   Pulse 69   Temp 97.6 F (36.4 C) (Oral)   Ht 5\' 3"  (1.6 m)   Wt 151 lb (68.5 kg)   BMI 26.75 kg/m   Physical Examination:  General: Awake, alert, well nourished, nontoxic, No acute distress HEENT: Normal    Eyes: PERRLA, extraocular membranes intact, sclera white    Nose: nasal turbinates moist, no nasal discharge    Throat: moist mucus membranes Cardio: regular rate and rhythm, S1S2 heard, no murmurs appreciated Pulm: clear to auscultation bilaterally, no wheezes, rhonchi or rales; normal work of breathing on room air GI: soft, mild TTP to the LLQ and RLQ, no peritoneal signs on exam. non-distended, bowel sounds present x4, no hepatomegaly, no splenomegaly, no masses  Assessment/ Plan: 57 y.o. female   1. Left lower quadrant pain Patient afebrile and nontoxic-appearing with normal vital signs.  Her physical exam was remarkable for mild tenderness to palpation to the left and right lower quadrants.  She had no peritoneal signs on exam but was reporting what sounded to be like peritoneal signs, noting that she was having increased left lower abdominal pain with activity such as walking.  I reviewed her abdominal x-ray and abdominal CT scan with her.  CT abdomen did demonstrate descending diverticulosis within the colon.  This would be consistent with where her left lower quadrant pain is.  She is currently on the appropriate antibiotics with Cipro and Flagyl.  I did discuss with her that if symptoms are worsening that she should be evaluated in the emergency department.  She may need repeat abdominal imaging and IV antibiotics if she is not responding to oral antibiotics.  She is no longer  having any episodes of nausea or vomiting.  She is tolerating p.o. without difficulty.  She has decided that she would like to wait for another day or so and see if symptoms continue to worsen or if they start improving on oral antibiotics.  Reasons for emergent evaluation in the emergency department discussed the patient.  She was good understanding will follow-up  as needed.   Julie Norlander, DO [place] [phone]

## 2017-06-04 ENCOUNTER — Encounter: Payer: Self-pay | Admitting: Family

## 2017-06-04 ENCOUNTER — Ambulatory Visit (INDEPENDENT_AMBULATORY_CARE_PROVIDER_SITE_OTHER): Payer: Self-pay | Admitting: Family

## 2017-06-04 VITALS — BP 103/76 | HR 59 | Temp 98.2°F | Ht 63.0 in | Wt 148.8 lb

## 2017-06-04 DIAGNOSIS — F419 Anxiety disorder, unspecified: Secondary | ICD-10-CM

## 2017-06-04 DIAGNOSIS — B373 Candidiasis of vulva and vagina: Secondary | ICD-10-CM

## 2017-06-04 DIAGNOSIS — F321 Major depressive disorder, single episode, moderate: Secondary | ICD-10-CM

## 2017-06-04 DIAGNOSIS — B3731 Acute candidiasis of vulva and vagina: Secondary | ICD-10-CM

## 2017-06-04 MED ORDER — CITALOPRAM HYDROBROMIDE 20 MG PO TABS: 20.0000 mg | ORAL_TABLET | Freq: Every day | ORAL | 5 refills | Status: DC

## 2017-06-04 MED ORDER — ALPRAZOLAM 1 MG PO TABS: 1.0000 mg | ORAL_TABLET | Freq: Two times a day (BID) | ORAL | 5 refills | Status: DC | PRN

## 2017-06-04 MED ORDER — FLUCONAZOLE 150 MG PO TABS: 150.0000 mg | ORAL_TABLET | ORAL | 0 refills | Status: DC | PRN

## 2017-06-04 NOTE — Progress Notes (Signed)
Subjective:    Patient ID: [name], female    DOB: 07-Jun-1960, 57 y.o.   MRN: [id number]  Chief Complaint  Patient presents with  . Anxiety    six month recheck with refills    Pt presents to the office today for anxiety follow up. She is still currently in the process for filing for disability and is self pay today. She states she would like to hold off on all health maintenance until she gets insurance.  Anxiety  Presents for follow-up visit. Symptoms include depressed mood, excessive worry, irritability, nervous/anxious behavior and restlessness. Symptoms occur most days. The severity of symptoms is moderate. The quality of sleep is good.    Vaginal Discharge  The patient's primary symptoms include vaginal discharge. The patient's pertinent negatives include no genital itching or genital lesions. The current episode started 1 to 4 weeks ago. The problem occurs intermittently. The problem has been waxing and waning. There has been no bleeding. Exacerbated by: recent antibiotic. She has tried nothing for the symptoms. The treatment provided no relief.      Review of Systems  Constitutional: Positive for irritability.  Genitourinary: Positive for vaginal discharge.  Psychiatric/Behavioral: The patient is nervous/anxious.   All other systems reviewed and are negative.      Objective:   Physical Exam  Constitutional: She is oriented to person, place, and time. She appears well-developed and well-nourished. No distress.  HENT:  Head: Normocephalic and atraumatic.  Right Ear: External ear normal.  Left Ear: External ear normal.  Mouth/Throat: Oropharynx is clear and moist.  Eyes: Pupils are equal, round, and reactive to light.  Neck: Normal range of motion. Neck supple. No thyromegaly present.  Cardiovascular: Normal rate, regular rhythm, normal heart sounds and intact distal pulses.  No murmur heard. Pulmonary/Chest: Effort normal and breath sounds normal. No  respiratory distress. She has no wheezes.  Abdominal: Soft. Bowel sounds are normal. She exhibits no distension. There is no tenderness.  Musculoskeletal: Normal range of motion. She exhibits no edema or tenderness.  Neurological: She is alert and oriented to person, place, and time. She has normal reflexes. No cranial nerve deficit.  Skin: Skin is warm and dry.  Psychiatric: She has a normal mood and affect. Her behavior is normal. Judgment and thought content normal.  Vitals reviewed.    BP 103/76   Pulse (!) 59   Temp 98.2 F (36.8 C) (Oral)   Ht 5\' 3"  (1.6 m)   Wt 148 lb 12.8 oz (67.5 kg)   BMI 26.36 kg/m      Assessment & Plan:  [name] comes in today with chief complaint of Anxiety (six month recheck with refills)   Diagnosis and orders addressed:  1. Anxiety Will start Celexa 20 mg today Drug screen pending Stress management  - ALPRAZolam (XANAX) 1 MG tablet; Take 1 tablet (1 mg total) by mouth 2 (two) times daily as needed for anxiety.  Dispense: 60 tablet; Refill: 5 - ToxASSURE Select 13 (MW), Urine - citalopram (CELEXA) 20 MG tablet; Take 1 tablet (20 mg total) by mouth daily.  Dispense: 90 tablet; Refill: 5  2. Current moderate episode of major depressive disorder, unspecified whether recurrent ([place])  3. Vagina, candidiasis Keep clean and dry - fluconazole (DIFLUCAN) 150 MG tablet; Take 1 tablet (150 mg total) by mouth every three (3) days as needed.  Dispense: 3 tablet; Refill: 0     Follow up plan: 6 months  Julie Dun, FNP

## 2017-06-04 NOTE — Patient Instructions (Signed)

## 2017-06-07 ENCOUNTER — Ambulatory Visit: Payer: Self-pay | Admitting: Family

## 2017-06-07 ENCOUNTER — Other Ambulatory Visit: Payer: Self-pay | Admitting: Family Medicine

## 2017-06-07 NOTE — Telephone Encounter (Signed)
Dont see a Vit D level in EPIC 

## 2017-06-08 LAB — TOXASSURE SELECT 13 (MW), URINE

## 2017-06-10 ENCOUNTER — Ambulatory Visit: Payer: Self-pay | Admitting: Family

## 2017-07-17 ENCOUNTER — Telehealth: Payer: Self-pay | Admitting: Family

## 2017-07-18 ENCOUNTER — Telehealth: Payer: Self-pay | Admitting: Family

## 2017-07-18 NOTE — Telephone Encounter (Signed)
Spoke with pt, see next phone call message

## 2017-07-18 NOTE — Telephone Encounter (Signed)
NA, NVM 

## 2017-07-18 NOTE — Telephone Encounter (Signed)
Explained to pt that she would have to call Dentist back since they did the extraction. She understands, told her she would have to be seen here and she did not want to

## 2017-08-27 ENCOUNTER — Ambulatory Visit: Payer: Self-pay | Admitting: Family

## 2017-09-17 ENCOUNTER — Ambulatory Visit: Payer: Self-pay | Admitting: Family

## 2017-11-29 ENCOUNTER — Encounter: Payer: Self-pay | Admitting: Family

## 2017-11-29 ENCOUNTER — Ambulatory Visit (INDEPENDENT_AMBULATORY_CARE_PROVIDER_SITE_OTHER): Payer: Self-pay | Admitting: Family

## 2017-11-29 VITALS — BP 122/81 | HR 65 | Temp 97.1°F | Ht 63.0 in | Wt 149.4 lb

## 2017-11-29 DIAGNOSIS — F1320 Sedative, hypnotic or anxiolytic dependence, uncomplicated: Secondary | ICD-10-CM | POA: Insufficient documentation

## 2017-11-29 DIAGNOSIS — Z720 Tobacco use: Secondary | ICD-10-CM

## 2017-11-29 DIAGNOSIS — Z79899 Other long term (current) drug therapy: Secondary | ICD-10-CM | POA: Insufficient documentation

## 2017-11-29 DIAGNOSIS — F419 Anxiety disorder, unspecified: Secondary | ICD-10-CM

## 2017-11-29 DIAGNOSIS — F321 Major depressive disorder, single episode, moderate: Secondary | ICD-10-CM

## 2017-11-29 MED ORDER — ALPRAZOLAM 1 MG PO TABS
1.0000 mg | ORAL_TABLET | Freq: Two times a day (BID) | ORAL | 5 refills | Status: DC | PRN
Start: 2017-11-29 — End: 2018-05-23

## 2017-11-29 NOTE — Patient Instructions (Signed)

## 2017-11-29 NOTE — Progress Notes (Signed)
   Subjective:    Patient ID: [name], female    DOB: 07/15/1960, 57 y.o.   MRN: [id number]  Chief Complaint  Patient presents with  . Medical Management of Chronic Issues    refill anxiety medication   Pt presents to the office today for anxiety follow up. She is still currently in the process for filing for disability and is self pay today. She states she would like to hold off on all health maintenance until she gets insurance. She is followed by a Neurologists for chronic back pain and is taking Norco TID that helps.   PT is tearful today because she is having financial problems and states she just lost her car.  Anxiety  Presents for follow-up visit. Symptoms include depressed mood, excessive worry, insomnia, irritability, nervous/anxious behavior and restlessness. Symptoms occur constantly. The severity of symptoms is moderate. The quality of sleep is good.        Review of Systems  Constitutional: Positive for irritability.  Psychiatric/Behavioral: The patient is nervous/anxious, has insomnia and is hyperactive.   All other systems reviewed and are negative.      Objective:   Physical Exam  Constitutional: She is oriented to person, place, and time. She appears well-developed and well-nourished. No distress.  HENT:  Head: Normocephalic and atraumatic.  Right Ear: External ear normal.  Left Ear: External ear normal.  Mouth/Throat: Oropharynx is clear and moist.  Eyes: Pupils are equal, round, and reactive to light.  Neck: Normal range of motion. Neck supple. No thyromegaly present.  Cardiovascular: Normal rate, regular rhythm, normal heart sounds and intact distal pulses.  No murmur heard. Pulmonary/Chest: Effort normal and breath sounds normal. No respiratory distress. She has no wheezes.  Abdominal: Soft. Bowel sounds are normal. She exhibits no distension. There is no tenderness.  Musculoskeletal: Normal range of motion. She exhibits no edema or tenderness.    Neurological: She is alert and oriented to person, place, and time. She has normal reflexes. No cranial nerve deficit.  Skin: Skin is warm and dry.  Psychiatric: Her behavior is normal. Judgment and thought content normal. Her mood appears anxious.  Pt tearful and crying  Vitals reviewed.     BP 122/81   Pulse 65   Temp (!) 97.1 F (36.2 C) (Oral)   Ht 5\' 3"  (1.6 m)   Wt 149 lb 6.4 oz (67.8 kg)   BMI 26.47 kg/m      Assessment & Plan:  [name] comes in today with chief complaint of Medical Management of Chronic Issues (refill anxiety medication)   Diagnosis and orders addressed:  1. Anxiety Discussed weaning off xanax since talking Norco Pt states she will try cutting in half and see how she does Stress management discussed - ALPRAZolam (XANAX) 1 MG tablet; Take 1 tablet (1 mg total) by mouth 2 (two) times daily as needed for anxiety.  Dispense: 60 tablet; Refill: 5  2. Tobacco user Smoking cessation   3. Current moderate episode of major depressive disorder, unspecified whether recurrent ([place]) PT states she has tried multiple medications, but can not tolerate the "side effects"  4. Benzodiazepine dependence ([place])  5. Controlled substance agreement signed     Follow up plan: 6 months    [name], FNP

## 2017-12-03 LAB — TOXASSURE SELECT 13 (MW), URINE

## 2017-12-06 ENCOUNTER — Ambulatory Visit: Payer: Self-pay | Admitting: Family

## 2017-12-20 ENCOUNTER — Telehealth: Payer: Self-pay | Admitting: Family

## 2017-12-20 MED ORDER — GABAPENTIN 300 MG PO CAPS: 300.0000 mg | ORAL_CAPSULE | Freq: Three times a day (TID) | ORAL | 3 refills | Status: DC

## 2017-12-20 NOTE — Telephone Encounter (Signed)
PT states that she is having really bad neuropathy pain, and wants to know if christy can call in some gabpentin for that pain, [facility]

## 2017-12-20 NOTE — Telephone Encounter (Signed)
Patient states that [name], FNP prescribed gabapentin for her at some point in the past, but she did not take it because she read that you should not take if you have a family history of seizures.  Patient thought she had a family history of seizures, but found out this was not the case.   She is having a burning, "pins and needles" sensation in her legs from the knees down and sometimes in her hands.  Wants to know if Alyse Low will prescribe something for her symptoms.

## 2017-12-20 NOTE — Telephone Encounter (Signed)
Gabapentin Prescription sent to pharmacy. She can use GoodRx to help with cost.

## 2017-12-20 NOTE — Telephone Encounter (Signed)
Patient notified

## 2018-01-12 ENCOUNTER — Other Ambulatory Visit: Payer: Self-pay | Admitting: Family

## 2018-01-14 ENCOUNTER — Other Ambulatory Visit: Payer: Self-pay | Admitting: Family

## 2018-04-21 ENCOUNTER — Telehealth: Payer: Self-pay | Admitting: *Deleted

## 2018-04-21 NOTE — Telephone Encounter (Signed)
VM pt wanting refill meds on gabapentin & xanax, she has enough until needing her appt on 04/29/18

## 2018-05-22 ENCOUNTER — Other Ambulatory Visit: Payer: Self-pay

## 2018-05-23 ENCOUNTER — Encounter: Payer: Self-pay | Admitting: Family

## 2018-05-23 ENCOUNTER — Ambulatory Visit: Payer: Self-pay | Admitting: Family

## 2018-05-23 VITALS — BP 111/72 | HR 56 | Temp 97.4°F | Ht 63.0 in | Wt 156.0 lb

## 2018-05-23 DIAGNOSIS — F1320 Sedative, hypnotic or anxiolytic dependence, uncomplicated: Secondary | ICD-10-CM

## 2018-05-23 DIAGNOSIS — Z79899 Other long term (current) drug therapy: Secondary | ICD-10-CM

## 2018-05-23 DIAGNOSIS — F419 Anxiety disorder, unspecified: Secondary | ICD-10-CM

## 2018-05-23 DIAGNOSIS — Z720 Tobacco use: Secondary | ICD-10-CM

## 2018-05-23 DIAGNOSIS — L700 Acne vulgaris: Secondary | ICD-10-CM

## 2018-05-23 MED ORDER — ALPRAZOLAM 1 MG PO TABS: 1.0000 mg | ORAL_TABLET | Freq: Two times a day (BID) | ORAL | 5 refills | Status: DC | PRN

## 2018-05-23 MED ORDER — TRETINOIN 0.1 % EX CREA: TOPICAL_CREAM | Freq: Every day | CUTANEOUS | 2 refills | Status: DC

## 2018-05-23 NOTE — Patient Instructions (Signed)
Benzodiazepine Overdose  Benzodiazepines are prescription medicines that decrease the activity of (depress) the central nervous system and cause changes in certain brain chemicals (neurotransmitters). These are the most commonly prescribed benzodiazepines:  · Alprazolam.  · Lorazepam.  · Clonazepam.  · Diazepam.  · Temazepam.  A benzodiazepine overdose happens when you take too much of your medicine. The effects of an overdose can be mild, dangerous, or even deadly. Benzodiazepine overdose is a medical emergency.  What are the causes?  This condition may be caused by:  · Taking too much of a medicine by accident.  · Taking too much of a medicine on purpose.  · An error made by a health care provider who prescribes a medicine.  · An error made by the pharmacist who fills the prescription order.  What increases the risk?  This condition is more likely in:  · Children. They may be attracted to colorful pills. Because of a child's small size, even a small amount of a medicine can be dangerous.  · Elderly people. They may be taking many different medicines. Elderly people may have difficulty reading labels or remembering when they last took their medicine.  · People who use:  ? Illegal drugs.  ? Other substances, including alcohol, while taking benzodiazepines.  · People who have:  ? A history of drug or alcohol abuse.  ? Certain mental health conditions.  ? Breathing problems.  ? Liver problems.  What are the signs or symptoms?  Symptoms of this condition depend on the type of medicine and the amount that was taken. Symptoms may include:  · Drowsiness.  · Confusion.  · Lack of energy.  · Slurred speech.  · Clumsiness.  · Muscle weakness.  · Dizziness.  · Slow breathing.  · Coma.  Death from a benzodiazepine overdose is rare. Death is more likely if benzodiazepines are taken at the same time as other central nervous system depressants, such as alcohol.  How is this diagnosed?  This condition is diagnosed based on your  symptoms. It is important to tell your health care provider:  · All of the medicines that you took.  · When you took the medicines.  · Whether you have been drinking alcohol or using other substances.  Your health care provider will do a physical exam. This exam may include:  · Checking and monitoring your heart rate and rhythm, your temperature, and your blood pressure (vital signs).  · Checking your breathing and oxygen level.  You may also have blood tests or urine tests.  How is this treated?  Supporting your vital signs and your breathing is the first step in treating a benzodiazepine overdose. Treatment may also include:  · Giving fluids and minerals (electrolytes) through an IV tube.  · Inserting a breathing tube (endotracheal tube) in your airway to help you breathe.  · Passing a tube through your nose and into your stomach (NG tube, or nasogastric tube) to wash out your stomach.  · Giving medicines that:  ? Increase your blood pressure.  ? Reverse the effects of the benzodiazepine (flumazenil).  ? Absorb any benzodiazepine that is in your digestive system. This treatment is rare.  · Ongoing counseling and mental health support if you intentionally overdosed or used an illegal drug.  Follow these instructions at home:  · Take over-the-counter and prescription medicines only as told by your health care provider. Always ask your health care provider about possible side effects and interactions of any new medicine   that you start taking.  · Keep a list of all of the medicines that you take, including over-the-counter medicines. Bring this list with you to all of your medical visits.  · Drink enough fluid to keep your urine clear or pale yellow.  · Keep all follow-up visits as told by your health care provider. This is important.  How is this prevented?  · Get help if you are struggling with:  ? Alcohol or drug use.  ? Depression or another mental health problem.  · Keep the phone number of your local poison  control center near your phone or on your cell phone.  · Store all medicines in safety containers that are out of the reach of children.  · Read the drug inserts that come with your medicines.  · Do not drink alcohol when taking benzodiazepines.  · Do not use illegal drugs.  · Do not take benzodiazepines that are not prescribed for you.  Contact a health care provider if:  · Your symptoms return.  · You develop new symptoms or side effects when you take medicines.  Get help right away if:  · You think that you or someone else may have taken too much of a benzodiazepine. The hotline of the National Poison Control Center is (800) 222-1222.  · You or someone else is having symptoms of a benzodiazepine overdose.  · You have serious thoughts about hurting yourself or others.  · You have:  ? Difficulty breathing.  ? A loss of consciousness.  ? A seizure.  · You feel dizzy all the time.  · You feel weak or you faint.  Benzodiazepine overdose is an emergency. Do not wait to see if the symptoms will go away. Get medical help right away. Call your local emergency services (911 in the [place] Do not drive yourself to the hospital.  This information is not intended to replace advice given to you by your health care provider. Make sure you discuss any questions you have with your health care provider.  Document Released: 02/16/2004 Document Revised: 06/16/2015 Document Reviewed: 06/30/2014  Elsevier Interactive Patient Education © 2019 [employer] Inc[employer]

## 2018-05-23 NOTE — Progress Notes (Signed)
Subjective:    Patient ID: [name], female    DOB: Apr 03, 1960, 58 y.o.   MRN: [id number]  Pt presents to the office today for anxiety follow up. She is still currently in the process for filing for disability and is self pay today. She states she would like to hold off on all health maintenance until she gets insurance. She is followed by a Neurologists for chronic back pain and is taking Norco TID that helps.   PT requesting medication for her acne today. States she has had this her whole life, but she will get blackheads, cyst-like bumps under her skin and she can not help to pick at it.  Anxiety  Presents for follow-up visit. Symptoms include decreased concentration, depressed mood, excessive worry, irritability, nervous/anxious behavior, panic and restlessness. Symptoms occur most days. The severity of symptoms is moderate. The quality of sleep is good.        Review of Systems  Constitutional: Positive for irritability.  Psychiatric/Behavioral: Positive for decreased concentration. The patient is nervous/anxious.   All other systems reviewed and are negative.      Objective:   Physical Exam Vitals signs reviewed.  Constitutional:      General: She is not in acute distress.    Appearance: She is well-developed.  HENT:     Head: Normocephalic and atraumatic.     Right Ear: Tympanic membrane normal.     Left Ear: Tympanic membrane normal.  Eyes:     Pupils: Pupils are equal, round, and reactive to light.  Neck:     Musculoskeletal: Normal range of motion and neck supple.     Thyroid: No thyromegaly.  Cardiovascular:     Rate and Rhythm: Normal rate and regular rhythm.     Heart sounds: Normal heart sounds. No murmur.  Pulmonary:     Effort: Pulmonary effort is normal. No respiratory distress.     Breath sounds: Normal breath sounds. No wheezing.  Abdominal:     General: Bowel sounds are normal. There is no distension.     Palpations: Abdomen is soft.   Tenderness: There is no abdominal tenderness.  Musculoskeletal: Normal range of motion.        General: No tenderness.  Skin:    General: Skin is warm and dry.  Neurological:     Mental Status: She is alert and oriented to person, place, and time.     Cranial Nerves: No cranial nerve deficit.     Deep Tendon Reflexes: Reflexes are normal and symmetric.  Psychiatric:        Behavior: Behavior normal.        Thought Content: Thought content normal.        Judgment: Judgment normal.     BP 111/72   Pulse (!) 56   Temp (!) 97.4 F (36.3 C) (Oral)   Ht 5\' 3"  (1.6 m)   Wt 156 lb (70.8 kg)   BMI 27.63 kg/m       Assessment & Plan:  CIGI BEGA comes in today with chief complaint of Medical Management of Chronic Issues (refills)   Diagnosis and orders addressed:  1. Anxiety Long discussion with patient about decreasing xanax since she is taking Norco We will do referral to Psychiatry, she states this is hard as she is self pay. But will follow up - Ambulatory referral to Psychiatry - ToxASSURE Select 13 (MW), Urine - ALPRAZolam (XANAX) 1 MG tablet; Take 1 tablet (1 mg total) by mouth  2 (two) times daily as needed for anxiety.  Dispense: 60 tablet; Refill: 5  2. Benzodiazepine dependence (HCC) - ToxASSURE Select 13 (MW), Urine - ALPRAZolam (XANAX) 1 MG tablet; Take 1 tablet (1 mg total) by mouth 2 (two) times daily as needed for anxiety.  Dispense: 60 tablet; Refill: 5  3. Controlled substance agreement signed - ToxASSURE Select 13 (MW), Urine - ALPRAZolam (XANAX) 1 MG tablet; Take 1 tablet (1 mg total) by mouth 2 (two) times daily as needed for anxiety.  Dispense: 60 tablet; Refill: 5  4. Tobacco user  5. Acne vulgaris - tretinoin (RETIN-A) 0.1 % cream; Apply topically at bedtime.  Dispense: 45 g; Refill: 2   Pt reviewed in [place] controlled database- Pt is on Norco from pain clinic and Xanax from me. Discussed about weaning off of xanax today. PT does not wish to do  this, but understands.  Health Maintenance reviewed Diet and exercise encouraged  Follow up plan: 3 months   [name], FNP

## 2018-05-26 LAB — TOXASSURE SELECT 13 (MW), URINE

## 2018-06-05 ENCOUNTER — Telehealth: Payer: Self-pay | Admitting: Family

## 2018-08-28 ENCOUNTER — Ambulatory Visit: Payer: Self-pay | Admitting: Family

## 2018-10-27 ENCOUNTER — Encounter: Payer: Self-pay | Admitting: Family

## 2018-10-27 ENCOUNTER — Other Ambulatory Visit: Payer: Self-pay

## 2018-10-27 ENCOUNTER — Ambulatory Visit (INDEPENDENT_AMBULATORY_CARE_PROVIDER_SITE_OTHER): Payer: Self-pay | Admitting: Family

## 2018-10-27 DIAGNOSIS — M5430 Sciatica, unspecified side: Secondary | ICD-10-CM

## 2018-10-27 DIAGNOSIS — Z79899 Other long term (current) drug therapy: Secondary | ICD-10-CM

## 2018-10-27 DIAGNOSIS — Z720 Tobacco use: Secondary | ICD-10-CM

## 2018-10-27 DIAGNOSIS — F321 Major depressive disorder, single episode, moderate: Secondary | ICD-10-CM

## 2018-10-27 DIAGNOSIS — F419 Anxiety disorder, unspecified: Secondary | ICD-10-CM

## 2018-10-27 DIAGNOSIS — F1320 Sedative, hypnotic or anxiolytic dependence, uncomplicated: Secondary | ICD-10-CM

## 2018-10-27 MED ORDER — ALPRAZOLAM 1 MG PO TABS
1.0000 mg | ORAL_TABLET | Freq: Two times a day (BID) | ORAL | 3 refills | Status: DC | PRN
Start: 2018-10-27 — End: 2019-03-24

## 2018-10-27 MED ORDER — GABAPENTIN 300 MG PO CAPS: 300.0000 mg | ORAL_CAPSULE | Freq: Three times a day (TID) | ORAL | 3 refills | Status: DC

## 2018-10-27 MED ORDER — HYDROXYZINE PAMOATE 25 MG PO CAPS: 25.0000 mg | ORAL_CAPSULE | Freq: Three times a day (TID) | ORAL | 5 refills | Status: DC | PRN

## 2018-10-27 NOTE — Progress Notes (Signed)
Virtual Visit via telephone Note Due to COVID-19 pandemic this visit was conducted virtually. This visit type was conducted due to national recommendations for restrictions regarding the COVID-19 Pandemic (e.g. social distancing, sheltering in place) in an effort to limit this patient's exposure and mitigate transmission in our community. All issues noted in this document were discussed and addressed.  A physical exam was not performed with this format.  I connected with [name] on 10/27/18 at 11:45 AM by telephone and verified that I am speaking with the correct person using two identifiers. [name] is currently located at home and no one is currently with her during visit. The provider, [name], FNP is located in their office at time of visit.  I discussed the limitations, risks, security and privacy concerns of performing an evaluation and management service by telephone and the availability of in person appointments. I also discussed with the patient that there may be a patient responsible charge related to this service. The patient expressed understanding and agreed to proceed.   History and Present Illness:   Pt calls the office today for chronic follow up. She is followed by Pain Clinic every 3 months for chronic back pain. She has been taking xanax 1 mg BID and Norco 5-325 mg #120. We have discussed several times we could not continue prescribing her xanax while she is taking Norco. We placed a referral to [facility], but she is self pay and states she could not afford it since she is self pay.  Back Pain This is a chronic problem. The current episode started more than 1 year ago. The problem occurs intermittently. The pain is present in the gluteal. The quality of the pain is described as aching. The pain is at a severity of 8/10. The pain is moderate.  Anxiety Presents for follow-up visit. Symptoms include decreased concentration, depressed mood, excessive  worry, irritability, nervous/anxious behavior and restlessness. Symptoms occur most days. The severity of symptoms is moderate. The quality of sleep is good.    Neuropathy  Pt complaining of burning pain in bilateral legs and hands with burning and numbness of 8 out 10. She states she takes gabapentin that helps, but states this rx has "ran out" and her pain is becoming worse.    Review of Systems  Constitutional: Positive for irritability.  Musculoskeletal: Positive for back pain.  Psychiatric/Behavioral: Positive for decreased concentration. The patient is nervous/anxious.      Observations/Objective: No SOB or distress noted   Assessment and Plan: 1. Anxiety - gabapentin (NEURONTIN) 300 MG capsule; Take 1 capsule (300 mg total) by mouth 3 (three) times daily.  Dispense: 90 capsule; Refill: 3 - ALPRAZolam (XANAX) 1 MG tablet; Take 1 tablet (1 mg total) by mouth 2 (two) times daily as needed for anxiety.  Dispense: 45 tablet; Refill: 3 - Ambulatory referral to Psychiatry - hydrOXYzine (VISTARIL) 25 MG capsule; Take 1 capsule (25 mg total) by mouth every 8 (eight) hours as needed.  Dispense: 90 capsule; Refill: 5  2. Benzodiazepine dependence (HCC) - ALPRAZolam (XANAX) 1 MG tablet; Take 1 tablet (1 mg total) by mouth 2 (two) times daily as needed for anxiety.  Dispense: 45 tablet; Refill: 3 - Ambulatory referral to Psychiatry  3. Controlled substance agreement signed - ALPRAZolam (XANAX) 1 MG tablet; Take 1 tablet (1 mg total) by mouth 2 (two) times daily as needed for anxiety.  Dispense: 45 tablet; Refill: 3 - Ambulatory referral to Psychiatry  4. Current  moderate episode of major depressive disorder, unspecified whether recurrent ([place]) - Ambulatory referral to Psychiatry - hydrOXYzine (VISTARIL) 25 MG capsule; Take 1 capsule (25 mg total) by mouth every 8 (eight) hours as needed.  Dispense: 90 capsule; Refill: 5  5. Tobacco user  6. Sciatica, unspecified laterality -  gabapentin (NEURONTIN) 300 MG capsule; Take 1 capsule (300 mg total) by mouth 3 (three) times daily.  Dispense: 90 capsule; Refill: 3  Long discussion with patient about mixing xanax and Norco together  We will decrease Xanax to #45 from #60, with the goal to tamper off Will start Vistaril  PT reviewed in [place] controlled database Another referral placed to behavorial Health, this is difficult because she is self pay RTO in 6 months     I discussed the assessment and treatment plan with the patient. The patient was provided an opportunity to ask questions and all were answered. The patient agreed with the plan and demonstrated an understanding of the instructions.   The patient was advised to call back or seek an in-person evaluation if the symptoms worsen or if the condition fails to improve as anticipated.  The above assessment and management plan was discussed with the patient. The patient verbalized understanding of and has agreed to the management plan. Patient is aware to call the clinic if symptoms persist or worsen. Patient is aware when to return to the clinic for a follow-up visit. Patient educated on when it is appropriate to go to the emergency department.   Time call ended:  12:18 pm  I provided  33 minutes of non-face-to-face time during this encounter.    [name], FNP

## 2018-11-18 ENCOUNTER — Other Ambulatory Visit: Payer: Self-pay | Admitting: Family

## 2018-11-18 DIAGNOSIS — F419 Anxiety disorder, unspecified: Secondary | ICD-10-CM

## 2018-11-18 DIAGNOSIS — F321 Major depressive disorder, single episode, moderate: Secondary | ICD-10-CM

## 2018-12-30 ENCOUNTER — Encounter: Payer: Self-pay | Admitting: Family

## 2018-12-30 ENCOUNTER — Ambulatory Visit (INDEPENDENT_AMBULATORY_CARE_PROVIDER_SITE_OTHER): Payer: Self-pay | Admitting: Family

## 2018-12-30 DIAGNOSIS — R634 Abnormal weight loss: Secondary | ICD-10-CM

## 2018-12-30 DIAGNOSIS — F321 Major depressive disorder, single episode, moderate: Secondary | ICD-10-CM

## 2018-12-30 DIAGNOSIS — F419 Anxiety disorder, unspecified: Secondary | ICD-10-CM

## 2018-12-30 DIAGNOSIS — Z720 Tobacco use: Secondary | ICD-10-CM

## 2018-12-30 NOTE — Progress Notes (Signed)
   Virtual Visit via telephone Note Due to COVID-19 pandemic this visit was conducted virtually. This visit type was conducted due to national recommendations for restrictions regarding the COVID-19 Pandemic (e.g. social distancing, sheltering in place) in an effort to limit this patient's exposure and mitigate transmission in our community. All issues noted in this document were discussed and addressed.  A physical exam was not performed with this format.  I connected with [name] on 12/30/18 at 1:40 pm  by telephone and verified that I am speaking with the correct person using two identifiers. [name] is currently located at home  and no one  is currently with her during visit. The provider, [name], FNP is located in their office at time of visit.  I discussed the limitations, risks, security and privacy concerns of performing an evaluation and management service by telephone and the availability of in person appointments. I also discussed with the patient that there may be a patient responsible charge related to this service. The patient expressed understanding and agreed to proceed.   History and Present Illness:  HPI   Pt calls the office today with increased anxiety and weight loss of 13-15 lbs over the last few months. She states she has been like this several years ago and was placed on megace which helped.   She is a 1/2 pack smoker for the last 30 years.   Review of Systems  Constitutional: Positive for weight loss.  Respiratory: Positive for cough.   All other systems reviewed and are negative.    Observations/Objective: No SOB or distress noted  Assessment and Plan: 1. Anxiety - CBC with Differential/Platelet; Future - BMP8+EGFR; Future  2. Current moderate episode of major depressive disorder, unspecified whether recurrent (HCC) - CBC with Differential/Platelet; Future - BMP8+EGFR; Future  3. Tobacco user - CBC with Differential/Platelet;  Future - BMP8+EGFR; Future  4. Weight loss - CBC with Differential/Platelet; Future - BMP8+EGFR; Future   Worrisome for malignancy with weight loss and smoking history. Pt is uninsuaned and states she can afford any scans at this time. We will do lab work today. She is very addiment that only megace will work for.    I discussed the assessment and treatment plan with the patient. The patient was provided an opportunity to ask questions and all were answered. The patient agreed with the plan and demonstrated an understanding of the instructions.   The patient was advised to call back or seek an in-person evaluation if the symptoms worsen or if the condition fails to improve as anticipated.  The above assessment and management plan was discussed with the patient. The patient verbalized understanding of and has agreed to the management plan. Patient is aware to call the clinic if symptoms persist or worsen. Patient is aware when to return to the clinic for a follow-up visit. Patient educated on when it is appropriate to go to the emergency department.   Time call ended:  1:53 pm  I provided 13 minutes of non-face-to-face time during this encounter.    [name], FNP

## 2018-12-30 NOTE — Addendum Note (Signed)
Addended by: Earlene Plater on: 12/30/2018 02:27 PM   Modules accepted: Orders

## 2018-12-31 LAB — BMP8+EGFR
BUN/Creatinine Ratio: 8 — ABNORMAL LOW (ref 9–23)
BUN: 6 mg/dL (ref 6–24)
CO2: 22 mmol/L (ref 20–29)
Calcium: 9.5 mg/dL (ref 8.7–10.2)
Chloride: 103 mmol/L (ref 96–106)
Creatinine, Ser: 0.75 mg/dL (ref 0.57–1.00)
GFR calc Af Amer: 102 mL/min/{1.73_m2} (ref >59)
GFR calc non Af Amer: 89 mL/min/{1.73_m2} (ref >59)
Glucose: 111 mg/dL — ABNORMAL HIGH (ref 65–99)
Potassium: 3.6 mmol/L (ref 3.5–5.2)
Sodium: 141 mmol/L (ref 134–144)

## 2018-12-31 LAB — CBC WITH DIFFERENTIAL/PLATELET
Basophils Absolute: 0 10*3/uL (ref 0.0–0.2)
Basos: 0 %
EOS (ABSOLUTE): 0.1 10*3/uL (ref 0.0–0.4)
Eos: 1 %
Hematocrit: 40.9 % (ref 34.0–46.6)
Hemoglobin: 13.8 g/dL (ref 11.1–15.9)
Immature Grans (Abs): 0 10*3/uL (ref 0.0–0.1)
Immature Granulocytes: 0 %
Lymphocytes Absolute: 4 10*3/uL — ABNORMAL HIGH (ref 0.7–3.1)
Lymphs: 36 %
MCH: 30.5 pg (ref 26.6–33.0)
MCHC: 33.7 g/dL (ref 31.5–35.7)
MCV: 91 fL (ref 79–97)
Monocytes Absolute: 0.6 10*3/uL (ref 0.1–0.9)
Monocytes: 5 %
Neutrophils Absolute: 6.3 10*3/uL (ref 1.4–7.0)
Neutrophils: 58 %
Platelets: 326 10*3/uL (ref 150–450)
RBC: 4.52 x10E6/uL (ref 3.77–5.28)
RDW: 12.9 % (ref 11.7–15.4)
WBC: 10.9 10*3/uL — ABNORMAL HIGH (ref 3.4–10.8)

## 2019-01-07 ENCOUNTER — Other Ambulatory Visit: Payer: Self-pay | Admitting: Family

## 2019-01-07 MED ORDER — MEGESTROL ACETATE 400 MG/10ML PO SUSP: 400.0000 mg | Freq: Every day | ORAL | 0 refills | Status: DC

## 2019-01-25 ENCOUNTER — Other Ambulatory Visit: Payer: Self-pay | Admitting: Family

## 2019-02-20 ENCOUNTER — Other Ambulatory Visit: Payer: Self-pay | Admitting: Family

## 2019-02-24 ENCOUNTER — Other Ambulatory Visit: Payer: Self-pay | Admitting: Family

## 2019-03-11 DIAGNOSIS — Z6825 Body mass index (BMI) 25.0-25.9, adult: Secondary | ICD-10-CM | POA: Diagnosis not present

## 2019-03-11 DIAGNOSIS — R030 Elevated blood-pressure reading, without diagnosis of hypertension: Secondary | ICD-10-CM | POA: Diagnosis not present

## 2019-03-11 DIAGNOSIS — M5416 Radiculopathy, lumbar region: Secondary | ICD-10-CM | POA: Diagnosis not present

## 2019-03-24 ENCOUNTER — Other Ambulatory Visit: Payer: Self-pay | Admitting: Family

## 2019-03-24 DIAGNOSIS — F419 Anxiety disorder, unspecified: Secondary | ICD-10-CM

## 2019-03-24 DIAGNOSIS — Z79899 Other long term (current) drug therapy: Secondary | ICD-10-CM

## 2019-03-24 DIAGNOSIS — F1320 Sedative, hypnotic or anxiolytic dependence, uncomplicated: Secondary | ICD-10-CM

## 2019-03-24 NOTE — Telephone Encounter (Signed)
  Medication Request  03/24/2019  What is the name of the medication? ALPRAZolam Duanne Moron) 1 MG tablet    Have you contacted your pharmacy to request a refill? Yes, she does not have money to pay for OV  Which pharmacy would you like this sent to?[facility]    Patient notified that their request is being sent to the clinical staff for review and that they should receive a call once it is complete. If they do not receive a call within 24 hours they can check with their pharmacy or our office.

## 2019-03-24 NOTE — Telephone Encounter (Signed)
Pt was seen for this in [place] and med was not sent in  - can you refill?

## 2019-03-25 ENCOUNTER — Other Ambulatory Visit: Payer: Self-pay

## 2019-03-26 ENCOUNTER — Ambulatory Visit (INDEPENDENT_AMBULATORY_CARE_PROVIDER_SITE_OTHER): Payer: Medicaid Other

## 2019-03-26 ENCOUNTER — Encounter: Payer: Self-pay | Admitting: Family

## 2019-03-26 ENCOUNTER — Ambulatory Visit (INDEPENDENT_AMBULATORY_CARE_PROVIDER_SITE_OTHER): Payer: Medicaid Other | Admitting: Family

## 2019-03-26 VITALS — BP 107/79 | HR 81 | Temp 97.8°F | Ht 63.0 in | Wt 140.0 lb

## 2019-03-26 DIAGNOSIS — M5441 Lumbago with sciatica, right side: Secondary | ICD-10-CM

## 2019-03-26 DIAGNOSIS — Z720 Tobacco use: Secondary | ICD-10-CM

## 2019-03-26 DIAGNOSIS — R634 Abnormal weight loss: Secondary | ICD-10-CM | POA: Diagnosis not present

## 2019-03-26 DIAGNOSIS — J410 Simple chronic bronchitis: Secondary | ICD-10-CM | POA: Diagnosis not present

## 2019-03-26 DIAGNOSIS — G8929 Other chronic pain: Secondary | ICD-10-CM

## 2019-03-26 DIAGNOSIS — F321 Major depressive disorder, single episode, moderate: Secondary | ICD-10-CM

## 2019-03-26 DIAGNOSIS — M5442 Lumbago with sciatica, left side: Secondary | ICD-10-CM

## 2019-03-26 DIAGNOSIS — F419 Anxiety disorder, unspecified: Secondary | ICD-10-CM | POA: Diagnosis not present

## 2019-03-26 DIAGNOSIS — Z79899 Other long term (current) drug therapy: Secondary | ICD-10-CM | POA: Diagnosis not present

## 2019-03-26 DIAGNOSIS — F1320 Sedative, hypnotic or anxiolytic dependence, uncomplicated: Secondary | ICD-10-CM

## 2019-03-26 MED ORDER — FLUOXETINE HCL 20 MG PO TABS: ORAL_TABLET | ORAL | 1 refills | Status: DC

## 2019-03-26 MED ORDER — ALPRAZOLAM 1 MG PO TABS: 1.0000 mg | ORAL_TABLET | Freq: Two times a day (BID) | ORAL | 3 refills | Status: DC | PRN

## 2019-03-26 MED ORDER — MEGESTROL ACETATE 40 MG/ML PO SUSP: 800.0000 mg | Freq: Every day | ORAL | 1 refills | Status: AC

## 2019-03-26 MED ORDER — FLUOXETINE HCL 40 MG PO CAPS: 40.0000 mg | ORAL_CAPSULE | Freq: Every day | ORAL | 3 refills | Status: DC

## 2019-03-26 NOTE — Patient Instructions (Signed)

## 2019-03-26 NOTE — Progress Notes (Signed)
Subjective:    Patient ID: [name], female    DOB: [birth year], 59 y.o.   MRN: [id number]  Chief Complaint  Patient presents with  . Medical Management of Chronic Issues    picking   . Anxiety   Pt calls the office today with increased anxiety and weight loss of 16 lbs over the last year. She was prescribed megace last visit, but states this has not helped. She is very stressed with financial reasons. She is self pay. We have referred her to [facility], but states she could not afford it. She has tried Lexapro, Cymbalta but can not tolerate it.  She is followed by Pain [facility] 3 months for chronic back pain.     She is a 1/2 pack smoker for the last 30 years. She reports she has intermittent SOB.  Anxiety Presents for follow-up visit. Symptoms include decreased concentration, depressed mood, excessive worry, insomnia, irritability, malaise, nervous/anxious behavior, panic and restlessness. Symptoms occur constantly. The severity of symptoms is moderate.    Nicotine Dependence Presents for follow-up visit. Symptoms include decreased concentration, insomnia and irritability. Her urge triggers include company of smokers. She smokes < 1/2 a pack of cigarettes per day.  Back Pain This is a chronic problem. The current episode started more than 1 year ago. The problem occurs intermittently. The problem has been waxing and waning since onset. The pain is present in the lumbar spine. The pain is at a severity of 5/10. The pain is mild.  Depression        This is a chronic problem.  The current episode started more than 1 year ago.   The onset quality is gradual.   Associated symptoms include decreased concentration, helplessness, hopelessness, insomnia, irritable and restlessness.  Past medical history includes anxiety.       Review of Systems  Constitutional: Positive for irritability.  Musculoskeletal: Positive for back pain.  Psychiatric/Behavioral: Positive for decreased  concentration and depression. The patient is nervous/anxious and has insomnia.        Objective:   Physical Exam Vitals reviewed.  Constitutional:      General: She is irritable. She is not in acute distress.    Appearance: She is well-developed.  HENT:     Head: Normocephalic and atraumatic.  Eyes:     Pupils: Pupils are equal, round, and reactive to light.  Neck:     Thyroid: No thyromegaly.  Cardiovascular:     Rate and Rhythm: Normal rate and regular rhythm.     Heart sounds: Normal heart sounds. No murmur.  Pulmonary:     Effort: Pulmonary effort is normal. No respiratory distress.     Breath sounds: Normal breath sounds. No wheezing.  Abdominal:     General: Bowel sounds are normal. There is no distension.     Palpations: Abdomen is soft.     Tenderness: There is no abdominal tenderness.  Musculoskeletal:        General: No tenderness. Normal range of motion.     Cervical back: Normal range of motion and neck supple.  Skin:    General: Skin is warm and dry.  Neurological:     Mental Status: She is alert and oriented to person, place, and time.     Cranial Nerves: No cranial nerve deficit.     Deep Tendon Reflexes: Reflexes are normal and symmetric.  Psychiatric:        Mood and Affect: Mood is anxious. Affect is tearful.  Behavior: Behavior normal.        Thought Content: Thought content normal.        Judgment: Judgment normal.       BP 107/79   Pulse 81   Temp 97.8 F (36.6 C) (Temporal)   Ht 5\' 3"  (1.6 m)   Wt 140 lb (63.5 kg)   SpO2 99%   BMI 24.80 kg/m      Assessment & Plan:  JAYLINE MUNYON comes in today with chief complaint of Medical Management of Chronic Issues (picking ) and Anxiety   Diagnosis and orders addressed:  1. Anxiety Will try to start Prozac 20 mg today Will increase to 40 mg in 3 weeks Stress management discussed - FLUoxetine (PROZAC) 20 MG tablet; Take 1 tablet (20 mg total) by mouth daily for 21 days, THEN 2  tablets (40 mg total) daily for 21 days.  Dispense: 63 tablet; Refill: 1  2. Benzodiazepine dependence (HCC) - FLUoxetine (PROZAC) 20 MG tablet; Take 1 tablet (20 mg total) by mouth daily for 21 days, THEN 2 tablets (40 mg total) daily for 21 days.  Dispense: 63 tablet; Refill: 1  3. Controlled substance agreement signed Pt reviewed in [place] controlled database- No red flags noted  4. Simple chronic bronchitis ([place]) Smoking cessation discussed - DG Chest 2 View; Future  5. Chronic bilateral low back pain with bilateral sciatica  6. Current moderate episode of major depressive disorder, unspecified whether recurrent ([place]) - DG Chest 2 View; Future  7. Tobacco user - DG Chest 2 View; Future  8. Weight loss - FLUoxetine (PROZAC) 20 MG tablet; Take 1 tablet (20 mg total) by mouth daily for 21 days, THEN 2 tablets (40 mg total) daily for 21 days.  Dispense: 63 tablet; Refill: 1 - DG Chest 2 View; Future - megestrol (MEGACE) 40 MG/ML suspension; Take 20 mLs (800 mg total) by mouth daily.  Dispense: 600 mL; Refill: 1    Labs pending Health Maintenance reviewed Diet and exercise encouraged  Follow up plan: 3 months    [name], FNP

## 2019-05-31 ENCOUNTER — Encounter (HOSPITAL_COMMUNITY): Payer: Self-pay | Admitting: Emergency Medicine

## 2019-05-31 ENCOUNTER — Emergency Department (HOSPITAL_COMMUNITY)
Admission: EM | Admit: 2019-05-31 | Discharge: 2019-06-01 | Disposition: A | Discharge status: Home / Self Care | Payer: Medicaid Other | Attending: Emergency Medicine | Admitting: Emergency Medicine

## 2019-05-31 ENCOUNTER — Emergency Department (HOSPITAL_COMMUNITY): Payer: Medicaid Other

## 2019-05-31 ENCOUNTER — Other Ambulatory Visit: Payer: Self-pay

## 2019-05-31 DIAGNOSIS — F17210 Nicotine dependence, cigarettes, uncomplicated: Secondary | ICD-10-CM | POA: Diagnosis not present

## 2019-05-31 DIAGNOSIS — Z79899 Other long term (current) drug therapy: Secondary | ICD-10-CM | POA: Insufficient documentation

## 2019-05-31 DIAGNOSIS — R531 Weakness: Secondary | ICD-10-CM | POA: Diagnosis not present

## 2019-05-31 LAB — CBC WITH DIFFERENTIAL/PLATELET
Abs Immature Granulocytes: 0.03 10*3/uL (ref 0.00–0.07)
Basophils Absolute: 0 10*3/uL (ref 0.0–0.1)
Basophils Relative: 0 %
Eosinophils Absolute: 0.1 10*3/uL (ref 0.0–0.5)
Eosinophils Relative: 1 %
HCT: 38 % (ref 36.0–46.0)
Hemoglobin: 12.7 g/dL (ref 12.0–15.0)
Immature Granulocytes: 0 %
Lymphocytes Relative: 44 %
Lymphs Abs: 4.5 10*3/uL — ABNORMAL HIGH (ref 0.7–4.0)
MCH: 32.1 pg (ref 26.0–34.0)
MCHC: 33.4 g/dL (ref 30.0–36.0)
MCV: 96 fL (ref 80.0–100.0)
Monocytes Absolute: 0.7 10*3/uL (ref 0.1–1.0)
Monocytes Relative: 7 %
Neutro Abs: 5.1 10*3/uL (ref 1.7–7.7)
Neutrophils Relative %: 48 %
Platelets: 343 10*3/uL (ref 150–400)
RBC: 3.96 MIL/uL (ref 3.87–5.11)
RDW: 13.1 % (ref 11.5–15.5)
WBC: 10.4 10*3/uL (ref 4.0–10.5)
nRBC: 0 % (ref 0.0–0.2)

## 2019-05-31 LAB — URINALYSIS, ROUTINE W REFLEX MICROSCOPIC
Bacteria, UA: NONE SEEN
Bilirubin Urine: NEGATIVE
Glucose, UA: NEGATIVE mg/dL
Ketones, ur: NEGATIVE mg/dL
Leukocytes,Ua: NEGATIVE
Nitrite: NEGATIVE
Protein, ur: NEGATIVE mg/dL
Specific Gravity, Urine: 1.011 (ref 1.005–1.030)
pH: 5 (ref 5.0–8.0)

## 2019-05-31 LAB — COMPREHENSIVE METABOLIC PANEL
ALT: 16 U/L (ref 0–44)
AST: 20 U/L (ref 15–41)
Albumin: 3.6 g/dL (ref 3.5–5.0)
Alkaline Phosphatase: 51 U/L (ref 38–126)
Anion gap: 10 (ref 5–15)
BUN: 8 mg/dL (ref 6–20)
CO2: 24 mmol/L (ref 22–32)
Calcium: 9.4 mg/dL (ref 8.9–10.3)
Chloride: 105 mmol/L (ref 98–111)
Creatinine, Ser: 0.93 mg/dL (ref 0.44–1.00)
GFR calc Af Amer: >60 mL/min (ref >60)
GFR calc non Af Amer: >60 mL/min (ref >60)
Glucose, Bld: 96 mg/dL (ref 70–99)
Potassium: 3.4 mmol/L — ABNORMAL LOW (ref 3.5–5.1)
Sodium: 139 mmol/L (ref 135–145)
Total Bilirubin: 0.4 mg/dL (ref 0.3–1.2)
Total Protein: 6.3 g/dL — ABNORMAL LOW (ref 6.5–8.1)

## 2019-05-31 MED ORDER — SODIUM CHLORIDE 0.9 % IV BOLUS (SEPSIS)
1000.0000 mL | Freq: Once | INTRAVENOUS | Status: AC
  Administered 2019-05-31: 1000 mL via INTRAVENOUS

## 2019-05-31 NOTE — ED Provider Notes (Signed)
TIME SEEN: 11:20 PM  CHIEF COMPLAINT: Generalized weakness  HPI: Patient is a 59 year old female with history of chronic pain, anxiety, depression who presents to the emergency department with complaints of generalized weakness for the past 4 weeks.  She states that she feels like she has no energy and feels weak and tired with just minimal exertion.  No chest pain or chest discomfort, shortness of breath, productive cough, fevers, vomiting or diarrhea, bloody stools or melena, numbness or focal weakness.  She states she had similar symptoms when she had a vitamin D deficiency and she was hoping to have her vitamin levels checked today.  She does have a PCP but has not seen them for these complaints.  She reports that she does not feel that she eats and drinks as much as she should.  She has previously been on Megace for poor appetite but states that she stopped this medication because she did not feel it was helping her.  ROS: See HPI Constitutional: no fever  Eyes: no drainage  ENT: no runny nose   Cardiovascular:  no chest pain  Resp: no SOB  GI: no vomiting GU: no dysuria Integumentary: no rash  Allergy: no hives  Musculoskeletal: no leg swelling  Neurological: no slurred speech ROS otherwise negative  PAST MEDICAL HISTORY/PAST SURGICAL HISTORY:  Past Medical History:  Diagnosis Date  . Anxiety   . Arthritis   . Back pain   . Depression   . Hip pain   . HSV-1 infection   . Hyperlipidemia   . Right leg pain   . Sciatica   . Tobacco user     MEDICATIONS:  Prior to Admission medications   Medication Sig Start Date End Date Taking? Authorizing Provider  ALPRAZolam Duanne Moron) 1 MG tablet Take 1 tablet (1 mg total) by mouth 2 (two) times daily as needed for anxiety. 03/26/19   [name], FNP  baclofen (LIORESAL) 10 MG tablet Take 10 mg by mouth 3 (three) times daily. 04/16/18   [provider]  FLUoxetine (PROZAC) 20 MG tablet Take 1 tablet (20 mg total) by mouth daily  for 21 days, THEN 2 tablets (40 mg total) daily for 21 days. 03/26/19 05/07/19  [name], FNP  FLUoxetine (PROZAC) 40 MG capsule Take 1 capsule (40 mg total) by mouth daily. 04/26/19   [name], FNP  gabapentin (NEURONTIN) 300 MG capsule Take 1 capsule (300 mg total) by mouth 3 (three) times daily. 10/27/18   [name], FNP  HYDROcodone-acetaminophen (NORCO/VICODIN) 5-325 MG per tablet Take 2 tablets by mouth every 4 (four) hours as needed for moderate pain. Neurologist give to patient for back.    [provider]  PROVENTIL HFA 108 (90 Base) MCG/ACT inhaler Inhale 2 puffs into the lungs every 6 (six) hours as needed for wheezing.  02/13/17   [provider]    ALLERGIES:  Allergies  Allergen Reactions  . Codeine Itching  . Meperidine Hcl Other (See Comments)    Makes her feel strange  . Sulfa Antibiotics Other (See Comments)    Joint pain    SOCIAL HISTORY:  Social History   Tobacco Use  . Smoking status: Current Every Day Smoker    Packs/day: 1.00    Types: Cigarettes    Start date: 01/23/1976  . Smokeless tobacco: Never Used  Substance Use Topics  . Alcohol use: No    FAMILY HISTORY: Family History  Problem Relation Age of Onset  . Brain  cancer Mother   . Cancer - Other Father        Abdominal   . Colon cancer Sister   . Colon cancer Paternal Grandmother   . Colon cancer Sister     EXAM: BP (!) 111/59 (BP Location: Right Arm)   Pulse (!) 47   Temp 98.3 F (36.8 C) (Oral)   Resp 15   Ht 5\' 3"  (1.6 m)   Wt 63.5 kg   SpO2 96%   BMI 24.80 kg/m  CONSTITUTIONAL: Alert and oriented and responds appropriately to questions. Well-appearing; well-nourished HEAD: Normocephalic EYES: Conjunctivae clear, pupils appear equal, EOM appear intact ENT: normal nose; dry appearing mucous membranes NECK: Supple, normal ROM CARD: RRR; S1 and S2 appreciated; no murmurs, no clicks, no rubs, no gallops RESP: Normal chest excursion without splinting  or tachypnea; breath sounds clear and equal bilaterally; no wheezes, no rhonchi, no rales, no hypoxia or respiratory distress, speaking full sentences ABD/GI: Normal bowel sounds; non-distended; soft, non-tender, no rebound, no guarding, no peritoneal signs, no hepatosplenomegaly BACK:  The back appears normal EXT: Normal ROM in all joints; no deformity noted, no edema; no cyanosis SKIN: Normal color for age and race; warm; no rash on exposed skin NEURO: Moves all extremities equally, cranial nerves II through XII intact, no pronator drift, sensation to light touch intact diffusely, cranial nerves II through XII intact, normal speech PSYCH: The patient's mood and manner are appropriate.   MEDICAL DECISION MAKING: Patient here with complaints of generalized weakness for over a month.  No focal neurologic deficits.  Doubt stroke.  Hemodynamically stable here.  She has had some intermittent heart rates in the upper 40s.  She is not on any heart medications.  On review of her records, it appears she has had heart rates in the 40s all the way back to 2014.  Her EKG x2 has been reviewed/interpreted and shows no acute abnormality, ischemia, interval changes, blocks.  Doubt ACS.  Labs, urine reviewed/interpreted with no significant abnormality.  No anemia, electrolyte derangement, acute kidney injury, UTI, significant dehydration.  Chest x-ray reviewed/interpreted and shows no acute abnormality.  She does have dry mucous membranes on exam.  Will give IV fluids and encourage oral intake here.  Will check thyroid function.  Discussed with patient that I feel she will need to follow-up with her primary care physician for further outpatient work-up.  We discussed that vitamin D deficiency is not something that we monitor in the emergency department and this can be checked by her PCP.  ED PROGRESS: Patient's TSH is normal.  I feel she is safe for discharge home with outpatient follow-up.  Patient comfortable with  plan.  At this time, I do not feel there is any life-threatening condition present. I have reviewed, interpreted and discussed all results (EKG, imaging, lab, urine as appropriate) and exam findings with patient/family. I have reviewed nursing notes and appropriate previous records.  I feel the patient is safe to be discharged home without further emergent workup and can continue workup as an outpatient as needed. Discussed usual and customary return precautions. Patient/family verbalize understanding and are comfortable with this plan.  Outpatient follow-up has been provided as needed. All questions have been answered.   EKG Interpretation  Date/Time:  Sunday May 31 2019 19:50:40 EDT Ventricular Rate:  64 PR Interval:  152 QRS Duration: 84 QT Interval:  430 QTC Calculation: 443 R Axis:   -41 Text Interpretation: Normal sinus rhythm Left axis deviation Septal infarct ,  age undetermined Abnormal ECG No significant change since last tracing Confirmed by Julietta Batterman, Cyril Mourning [phone]) on 05/31/2019 11:09:23 PM        EKG Interpretation  Date/Time:  Sunday May 31 2019 23:16:44 EDT Ventricular Rate:  48 PR Interval:  152 QRS Duration: 97 QT Interval:  482 QTC Calculation: 431 R Axis:   0 Text Interpretation: Sinus bradycardia Anteroseptal infarct, age indeterminate No significant change since last tracing Confirmed by Pryor Curia [phone]) on 05/31/2019 11:20:39 PM         [name] was evaluated in Emergency Department on 05/31/2019 for the symptoms described in the history of present illness. She was evaluated in the context of the global COVID-19 pandemic, which necessitated consideration that the patient might be at risk for infection with the SARS-CoV-2 virus that causes COVID-19. Institutional protocols and algorithms that pertain to the evaluation of patients at risk for COVID-19 are in a state of rapid change based on information released by regulatory bodies including the CDC and federal  and state organizations. These policies and algorithms were followed during the patient's care in the ED.      Ladean Steinmeyer, Delice Bison, DO 06/01/19 [phone]

## 2019-05-31 NOTE — ED Triage Notes (Signed)
Pt states she is very fatigue, dizzy with SOB for the past 4 weeks getting worse tonight.

## 2019-06-01 ENCOUNTER — Other Ambulatory Visit: Payer: Self-pay | Admitting: Family

## 2019-06-01 LAB — TSH: TSH: 0.714 u[IU]/mL (ref 0.350–4.500)

## 2019-06-01 NOTE — ED Notes (Signed)
Patient verbalizes understanding of discharge instructions. Opportunity for questioning and answers were provided. Armband removed by staff, pt discharged from ED ambulatory to home.  

## 2019-06-01 NOTE — Discharge Instructions (Addendum)
Your labs, EKG, urine and chest x-ray today were reassuring.  I recommend close follow-up with your primary care physician for your ongoing symptoms and further work-up as an outpatient.

## 2019-06-04 DIAGNOSIS — M545 Low back pain: Secondary | ICD-10-CM | POA: Diagnosis not present

## 2019-06-04 DIAGNOSIS — M5416 Radiculopathy, lumbar region: Secondary | ICD-10-CM | POA: Diagnosis not present

## 2019-07-20 ENCOUNTER — Other Ambulatory Visit: Payer: Self-pay | Admitting: Family

## 2019-07-20 ENCOUNTER — Ambulatory Visit: Payer: Medicaid Other | Admitting: Family

## 2019-07-20 ENCOUNTER — Other Ambulatory Visit: Payer: Self-pay

## 2019-07-20 ENCOUNTER — Encounter: Payer: Self-pay | Admitting: Family

## 2019-07-20 VITALS — BP 111/80 | HR 78 | Temp 97.9°F | Ht 63.0 in | Wt 135.0 lb

## 2019-07-20 DIAGNOSIS — M5442 Lumbago with sciatica, left side: Secondary | ICD-10-CM | POA: Diagnosis not present

## 2019-07-20 DIAGNOSIS — Z79899 Other long term (current) drug therapy: Secondary | ICD-10-CM

## 2019-07-20 DIAGNOSIS — I700 Atherosclerosis of aorta: Secondary | ICD-10-CM | POA: Diagnosis not present

## 2019-07-20 DIAGNOSIS — M5430 Sciatica, unspecified side: Secondary | ICD-10-CM

## 2019-07-20 DIAGNOSIS — F419 Anxiety disorder, unspecified: Secondary | ICD-10-CM | POA: Diagnosis not present

## 2019-07-20 DIAGNOSIS — R5383 Other fatigue: Secondary | ICD-10-CM

## 2019-07-20 DIAGNOSIS — F1320 Sedative, hypnotic or anxiolytic dependence, uncomplicated: Secondary | ICD-10-CM

## 2019-07-20 DIAGNOSIS — G8929 Other chronic pain: Secondary | ICD-10-CM

## 2019-07-20 DIAGNOSIS — E559 Vitamin D deficiency, unspecified: Secondary | ICD-10-CM

## 2019-07-20 DIAGNOSIS — J410 Simple chronic bronchitis: Secondary | ICD-10-CM

## 2019-07-20 DIAGNOSIS — H1045 Other chronic allergic conjunctivitis: Secondary | ICD-10-CM | POA: Diagnosis not present

## 2019-07-20 DIAGNOSIS — Z720 Tobacco use: Secondary | ICD-10-CM

## 2019-07-20 DIAGNOSIS — H40033 Anatomical narrow angle, bilateral: Secondary | ICD-10-CM | POA: Diagnosis not present

## 2019-07-20 DIAGNOSIS — E785 Hyperlipidemia, unspecified: Secondary | ICD-10-CM

## 2019-07-20 DIAGNOSIS — M5441 Lumbago with sciatica, right side: Secondary | ICD-10-CM

## 2019-07-20 MED ORDER — GABAPENTIN 300 MG PO CAPS: 300.0000 mg | ORAL_CAPSULE | Freq: Three times a day (TID) | ORAL | 3 refills | Status: DC

## 2019-07-20 MED ORDER — ALPRAZOLAM 1 MG PO TABS: 1.5000 mg | ORAL_TABLET | Freq: Every day | ORAL | 3 refills | Status: DC | PRN

## 2019-07-20 MED ORDER — PROVENTIL HFA 108 (90 BASE) MCG/ACT IN AERS: 2.0000 | INHALATION_SPRAY | Freq: Four times a day (QID) | RESPIRATORY_TRACT | 2 refills | Status: DC | PRN

## 2019-07-20 MED ORDER — DULERA 200-5 MCG/ACT IN AERO: 2.0000 | INHALATION_SPRAY | Freq: Every day | RESPIRATORY_TRACT | 2 refills | Status: DC

## 2019-07-20 MED ORDER — TRETINOIN MICROSPHERE 0.1 % EX GEL: Freq: Every day | CUTANEOUS | 3 refills | Status: DC

## 2019-07-20 NOTE — Addendum Note (Signed)
Addended by: [name] A on: 07/20/2019 11:32 AM   Modules accepted: Orders

## 2019-07-20 NOTE — Progress Notes (Signed)
Subjective:    Patient ID: [name], female    DOB: [birth year], 59 y.o.   MRN: [id number]  Chief Complaint  Patient presents with  . Anxiety   Pt presents to the office today chronic follow up. She is very stressed and increased GAD. She has tried Lexapro, Cymbalta but can not tolerate it.  She is followed by Pain Clinic 3 months for chronic back pain.     She is a 1/2 pack smoker for the last 30 years.She reports she has intermittent SOB. Anxiety Presents for follow-up visit. Symptoms include decreased concentration, excessive worry, irritability, nervous/anxious behavior and restlessness. Symptoms occur most days. The severity of symptoms is moderate.    Nicotine Dependence Presents for initial visit. Symptoms include decreased concentration and irritability. Her urge triggers include company of smokers.  Back Pain This is a chronic problem. The current episode started more than 1 year ago. The problem occurs intermittently. The problem has been waxing and waning since onset. The pain is present in the lumbar spine. The quality of the pain is described as aching. The pain is moderate.  COPD Pt continues to smoke 1/2 pack a day. Has intermittent SOB and nonproductive cough. Uses Dulera daily.     Review of Systems  Constitutional: Positive for irritability.  Musculoskeletal: Positive for back pain.  Psychiatric/Behavioral: Positive for decreased concentration. The patient is nervous/anxious.   All other systems reviewed and are negative.      Objective:   Physical Exam Vitals reviewed.  Constitutional:      General: She is not in acute distress.    Appearance: She is well-developed.  HENT:     Head: Normocephalic and atraumatic.     Right Ear: Tympanic membrane normal.     Left Ear: Tympanic membrane normal.  Eyes:     Pupils: Pupils are equal, round, and reactive to light.  Neck:     Thyroid: No thyromegaly.  Cardiovascular:     Rate and Rhythm: Normal rate  and regular rhythm.     Heart sounds: Normal heart sounds. No murmur heard.   Pulmonary:     Effort: Pulmonary effort is normal. No respiratory distress.     Breath sounds: Decreased breath sounds present. No wheezing.  Abdominal:     General: Bowel sounds are normal. There is no distension.     Palpations: Abdomen is soft.     Tenderness: There is no abdominal tenderness.  Musculoskeletal:        General: No tenderness. Normal range of motion.     Cervical back: Normal range of motion and neck supple.  Skin:    General: Skin is warm and dry.  Neurological:     Mental Status: She is alert and oriented to person, place, and time.     Cranial Nerves: No cranial nerve deficit.     Deep Tendon Reflexes: Reflexes are normal and symmetric.  Psychiatric:        Behavior: Behavior normal.        Thought Content: Thought content normal.        Judgment: Judgment normal.       BP 111/80   Pulse 78   Temp 97.9 F (36.6 C) (Temporal)   Ht _0  (1.6 m)   Wt 135 lb (61.2 kg)   SpO2 98%   BMI 23.91 kg/m      Assessment & Plan:  [name] comes in today with chief complaint of Anxiety   Diagnosis  and orders addressed:  1. Simple chronic bronchitis ([place]) - CMP14+EGFR  2. Aortic atherosclerosis (HCC) - CMP14+EGFR  3. Anxiety Patient reviewed in [place] controlled database, no flags noted. Contract and drug screen are up to date.  Discussed the risk of xanax with Norco. Goal is to continue to tamper xanax.  - ALPRAZolam (XANAX) 1 MG tablet; Take 1.5 tablets (1.5 mg total) by mouth daily as needed for anxiety.  Dispense: 45 tablet; Refill: 3 - CMP14+EGFR  4. Chronic bilateral low back pain with bilateral sciatica - CMP14+EGFR  5. Benzodiazepine dependence (HCC) - ALPRAZolam (XANAX) 1 MG tablet; Take 1.5 tablets (1.5 mg total) by mouth daily as needed for anxiety.  Dispense: 45 tablet; Refill: 3 - CMP14+EGFR  6. Controlled substance agreement signed - ALPRAZolam (XANAX)  1 MG tablet; Take 1.5 tablets (1.5 mg total) by mouth daily as needed for anxiety.  Dispense: 45 tablet; Refill: 3 - CMP14+EGFR  7. Fatigue, unspecified type - CMP14+EGFR - TSH - Anemia Profile B - VITAMIN D 25 Hydroxy (Vit-D Deficiency, Fractures)  8. Hyperlipidemia, unspecified hyperlipidemia type - CMP14+EGFR - Lipid panel  9. Tobacco user - CMP14+EGFR  10. Vitamin D deficiency - CMP14+EGFR - VITAMIN D 25 Hydroxy (Vit-D Deficiency, Fractures)   Labs pending Health Maintenance reviewed Diet and exercise encouraged  Follow up plan: 3 months   [name], FNP

## 2019-07-20 NOTE — Patient Instructions (Signed)
Your provider wants you to schedule an appointment with a Psychologist/Psychiatrist. The following list of offices requires the patient to call and make their own appointment, as there is information they need that only you can provide. Please feel free to choose form the following providers:  [facility]   [phone] Crisis Recovery in [place] [phone]  [facility]  [phone]   [address] [place], [place]  (Scheduled through [facility]) Must call and do an interview for appointment. Sees Children / Accepts Medicaid  [facility]    [phone]  [address], [address]    [place], [place]       [facility]  [phone] [address] [place], [place]  Evaluates for Autism but does not treat it Sees Children / Accepts Medicaid  [facility]    [phone] [address], [address]   [place], [place] Medication management, substance abuse, bipolar, grief, family, marriage, OCD, anxiety, PTSD Sees children / Accepts Medicaid  [facility]    [phone] [address] [place], [place] Sees children / Accepts Medicaid  [facility]  [phone] [address] [place], [place]   Dr Akinlayo     [phone] [address] [place], [place]  Sees ADD & ADHD for treatment Accepts Medicaid  [facility]  [phone] [address] [place], [place] Evaluates for Autism Accepts Medicaid  [facility]  [phone] [address]  [place], [place]  Does Adult ADD evaluations Does not accept Medicaid  [facility]   [phone] [address]   [place], [place] Uses animal therapy  Sees children as young as 3 years old Accepts Medicaid  [facility]     [phone]    [address]  [place], [place] [address] Sees children Accepts Medicaid  

## 2019-07-21 ENCOUNTER — Other Ambulatory Visit: Payer: Self-pay | Admitting: Family

## 2019-07-21 ENCOUNTER — Telehealth: Payer: Self-pay | Admitting: *Deleted

## 2019-07-21 DIAGNOSIS — D72829 Elevated white blood cell count, unspecified: Secondary | ICD-10-CM

## 2019-07-21 LAB — CMP14+EGFR
ALT: 7 IU/L (ref 0–32)
AST: 14 IU/L (ref 0–40)
Albumin/Globulin Ratio: 2 (ref 1.2–2.2)
Albumin: 4.7 g/dL (ref 3.8–4.9)
Alkaline Phosphatase: 63 IU/L (ref 48–121)
BUN/Creatinine Ratio: 12 (ref 9–23)
BUN: 10 mg/dL (ref 6–24)
Bilirubin Total: 0.4 mg/dL (ref 0.0–1.2)
CO2: 19 mmol/L — ABNORMAL LOW (ref 20–29)
Calcium: 9.8 mg/dL (ref 8.7–10.2)
Chloride: 102 mmol/L (ref 96–106)
Creatinine, Ser: 0.81 mg/dL (ref 0.57–1.00)
GFR calc Af Amer: 93 mL/min/{1.73_m2} (ref >59)
GFR calc non Af Amer: 80 mL/min/{1.73_m2} (ref >59)
Globulin, Total: 2.4 g/dL (ref 1.5–4.5)
Glucose: 79 mg/dL (ref 65–99)
Potassium: 3.9 mmol/L (ref 3.5–5.2)
Sodium: 139 mmol/L (ref 134–144)
Total Protein: 7.1 g/dL (ref 6.0–8.5)

## 2019-07-21 LAB — ANEMIA PROFILE B
Basophils Absolute: 0 10*3/uL (ref 0.0–0.2)
Basos: 0 %
EOS (ABSOLUTE): 0.1 10*3/uL (ref 0.0–0.4)
Eos: 0 %
Ferritin: 101 ng/mL (ref 15–150)
Folate: 3.1 ng/mL (ref >3.0)
Hematocrit: 44.3 % (ref 34.0–46.6)
Hemoglobin: 14.8 g/dL (ref 11.1–15.9)
Immature Grans (Abs): 0 10*3/uL (ref 0.0–0.1)
Immature Granulocytes: 0 %
Iron Saturation: 39 % (ref 15–55)
Iron: 140 ug/dL (ref 27–159)
Lymphocytes Absolute: 4.3 10*3/uL — ABNORMAL HIGH (ref 0.7–3.1)
Lymphs: 32 %
MCH: 32.3 pg (ref 26.6–33.0)
MCHC: 33.4 g/dL (ref 31.5–35.7)
MCV: 97 fL (ref 79–97)
Monocytes Absolute: 0.9 10*3/uL (ref 0.1–0.9)
Monocytes: 6 %
Neutrophils Absolute: 8.3 10*3/uL — ABNORMAL HIGH (ref 1.4–7.0)
Neutrophils: 62 %
Platelets: 355 10*3/uL (ref 150–450)
RBC: 4.58 x10E6/uL (ref 3.77–5.28)
RDW: 12.7 % (ref 11.7–15.4)
Retic Ct Pct: 1.5 % (ref 0.6–2.6)
Total Iron Binding Capacity: 361 ug/dL (ref 250–450)
UIBC: 221 ug/dL (ref 131–425)
Vitamin B-12: 266 pg/mL (ref 232–1245)
WBC: 13.7 10*3/uL — ABNORMAL HIGH (ref 3.4–10.8)

## 2019-07-21 LAB — TSH: TSH: 0.631 u[IU]/mL (ref 0.450–4.500)

## 2019-07-21 LAB — LIPID PANEL
Chol/HDL Ratio: 5.7 ratio — ABNORMAL HIGH (ref 0.0–4.4)
Cholesterol, Total: 198 mg/dL (ref 100–199)
HDL: 35 mg/dL — ABNORMAL LOW (ref >39)
LDL Chol Calc (NIH): 140 mg/dL — ABNORMAL HIGH (ref 0–99)
Triglycerides: 128 mg/dL (ref 0–149)
VLDL Cholesterol Cal: 23 mg/dL (ref 5–40)

## 2019-07-21 LAB — VITAMIN D 25 HYDROXY (VIT D DEFICIENCY, FRACTURES): Vit D, 25-Hydroxy: 42.9 ng/mL (ref 30.0–100.0)

## 2019-07-21 MED ORDER — RETIN-A 0.01 % EX GEL
Freq: Every day | CUTANEOUS | 3 refills | Status: DC
Start: 2019-07-21 — End: 2020-02-19

## 2019-07-21 NOTE — Telephone Encounter (Signed)
PA came in for pt - TRETINOIN Microshere 0.1 % gel.  Preferred with [facility] is NAME BRAND - changed rx and sent back to [facility] [place]

## 2019-07-28 ENCOUNTER — Other Ambulatory Visit: Payer: Self-pay

## 2019-07-28 ENCOUNTER — Encounter (HOSPITAL_COMMUNITY): Payer: Self-pay | Admitting: *Deleted

## 2019-07-28 NOTE — Progress Notes (Signed)
[facility] [address] [place] [address]   CLINIC:  Medical Oncology/Hematology  Patient Care Team: Julie Balloon, FNP as PCP - General (Family Medicine) Derek Jack, MD as Consulting Physician (Hematology)  CHIEF COMPLAINTS/PURPOSE OF CONSULTATION:  Evaluation of chronic leukocytosis  HISTORY OF PRESENTING ILLNESS:  [name] 59 y.o. female is here because of evaluation of chronic leukocytosis, at the request of [name] from [facility]. She has been having mildly elevated WBC counts since at least 2009.  She reports that after she received her second [facility] vaccine on 05/07/2019, she has been extremely fatigued and sweating from her face and head for the last 1 month. She also lost 20 lbs in the last 6 months unexpectedly since she lost her appetite after starting Megace. She has not started any new meds. She reports a remote history of prednisone use.  She currently smokes 1 PPD for 30 years. She received her second COVID vaccine on She has not had a blood transfusion before. She denies illicit drug use. She reports that her brother has lupus or Wagner's; mother deceased from brain tumor; father had GI cancer; PGM deceased from colon cancer; younger sister deceased from colon cancer; paternal uncle deceased from throat cancer. 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 Her last colonoscopy was in 2013.    MEDICAL HISTORY:  Past Medical History:  Diagnosis Date  . Anxiety   . Arthritis   . Back pain   . Depression   . Hip pain   . HSV-1 infection   . Hyperlipidemia   . Right leg pain   . Sciatica   . Tobacco user     SURGICAL HISTORY: Past Surgical History:  Procedure Laterality Date  . BUNIONECTOMY    . CATARACT EXTRACTION    . CESAREAN SECTION    . COLONOSCOPY W/ POLYPECTOMY    . ELBOW SURGERY    .  NOVASURE ABLATION    . RHYTIDECTOMY NECK / CHEEK / CHIN    . TONSILLECTOMY    . TUBAL LIGATION      SOCIAL HISTORY: Social History   Socioeconomic History  . Marital status: Divorced    Spouse name: Not on file  . Number of children: 2  . Years of education: Not on file  . Highest education level: Not on file  Occupational History  . Occupation: disabled  Tobacco Use  . Smoking status: Current Every Day Smoker    Packs/day: 1.00    Types: Cigarettes    Start date: 01/23/1976  . Smokeless tobacco: Never Used  Vaping Use  . Vaping Use: Never used  Substance and Sexual Activity  . Alcohol use: No  . Drug use: No  . Sexual activity: Yes  Other Topics Concern  . 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    Social Determinants of Health   Financial Resource Strain:   . Difficulty of Paying Living Expenses:   Food Insecurity:   . Worried About Charity fundraiser in the Last Year:   . Arboriculturist in the Last Year:   Transportation Needs:   . Film/video editor (Medical):   Marland Kitchen Lack of Transportation (Non-Medical):   Physical  Activity:   . Days of Exercise per Week:   . Minutes of Exercise per Session:   Stress:   . Feeling of Stress :   Social Connections:   . Frequency of Communication with Friends and Family:   . Frequency of Social Gatherings with Friends and Family:   . Attends Religious Services:   . Active Member of Clubs or Organizations:   . Attends Archivist Meetings:   Marland Kitchen Marital Status:   Intimate Partner Violence:   . Fear of Current or Ex-Partner:   . Emotionally Abused:   Marland Kitchen Physically Abused:   . Sexually Abused:     FAMILY HISTORY: Family History  Problem Relation Age of Onset  . Brain cancer Mother   . Cancer - Other Father        Abdominal   . Colon cancer Sister   . Colon cancer Paternal Grandmother     ALLERGIES:  is allergic to codeine,  meperidine hcl, and sulfa antibiotics.  MEDICATIONS:  Current Outpatient Medications  Medication Sig Dispense Refill  . ALPRAZolam (XANAX) 1 MG tablet Take 1.5 tablets (1.5 mg total) by mouth daily as needed for anxiety. (Patient taking differently: Take 1.5 mg by mouth 2 (two) times daily. ) 45 tablet 3  . aspirin EC 81 MG tablet Take 81 mg by mouth daily.    . baclofen (LIORESAL) 10 MG tablet Take 10 mg by mouth 3 (three) times daily.     . DULERA 200-5 MCG/ACT AERO Inhale 2 puffs into the lungs daily. 13 g 2  . gabapentin (NEURONTIN) 300 MG capsule Take 1 capsule (300 mg total) by mouth 3 (three) times daily. 90 capsule 3  . HYDROcodone-acetaminophen (NORCO) 10-325 MG tablet Take 1 tablet by mouth every 6 (six) hours as needed for pain. (Patient not taking: Reported on 07/28/2019)    . megestrol (MEGACE) 40 MG/ML suspension Take 20 mg by mouth 2 (two) times daily.    Marland Kitchen PROVENTIL HFA 108 (90 Base) MCG/ACT inhaler Inhale 2 puffs into the lungs every 6 (six) hours as needed for wheezing. (Patient not taking: Reported on 07/28/2019) 18 g 2  . RETIN-A 0.01 % gel Apply topically at bedtime. 45 g 3  . Vitamin D, Ergocalciferol, (DRISDOL) 1.25 MG (50000 UNIT) CAPS capsule Take 50,000 Units by mouth every 7 (seven) days.     No current facility-administered medications for this visit.    REVIEW OF SYSTEMS:   Review of Systems  Constitutional: Positive for appetite change (moderately decreased), fatigue (depleted) and unexpected weight change (20 lbs in 6 months).  Respiratory: Positive for shortness of breath.   Musculoskeletal: Positive for back pain (5/10 lower back and R hip pain).  Neurological: Positive for dizziness, headaches and numbness (fingertips & toes).  All other systems reviewed and are negative.    PHYSICAL EXAMINATION: ECOG PERFORMANCE STATUS: 1 - Symptomatic but completely ambulatory  Vitals:   07/29/19 0818  BP: 118/75  Pulse: (!) 51  Resp: 18  Temp: (!) 97.3 F (36.3  C)  SpO2: 97%   Filed Weights   07/29/19 0818  Weight: 137 lb 12.8 oz (62.5 kg)   Physical Exam Vitals reviewed.  Constitutional:      Appearance: Normal appearance.  HENT:     Head: No masses.     Mouth/Throat:     Lips: No lesions.     Mouth: No oral lesions.  Cardiovascular:     Rate and Rhythm: Normal rate and regular rhythm.  Pulses: Normal pulses.     Heart sounds: Normal heart sounds.  Pulmonary:     Effort: Pulmonary effort is normal.     Breath sounds: Normal breath sounds.  Abdominal:     Palpations: Abdomen is soft. There is no hepatomegaly, splenomegaly or mass.     Tenderness: There is no abdominal tenderness.     Hernia: No hernia is present.  Neurological:     General: No focal deficit present.     Mental Status: She is alert and oriented to person, place, and time.  Psychiatric:        Mood and Affect: Mood normal.        Behavior: Behavior normal.      LABORATORY DATA:  I have reviewed the data as listed Recent Results (from the past 2160 hour(s))  CBC with Differential     Status: Abnormal   Collection Time: 05/31/19  8:07 PM  Result Value Ref Range   WBC 10.4 4.0 - 10.5 K/uL   RBC 3.96 3.87 - 5.11 MIL/uL   Hemoglobin 12.7 12.0 - 15.0 g/dL   HCT 38.0 36 - 46 %   MCV 96.0 80.0 - 100.0 fL   MCH 32.1 26.0 - 34.0 pg   MCHC 33.4 30.0 - 36.0 g/dL   RDW 13.1 11.5 - 15.5 %   Platelets 343 150 - 400 K/uL   nRBC 0.0 0.0 - 0.2 %   Neutrophils Relative % 48 %   Neutro Abs 5.1 1.7 - 7.7 K/uL   Lymphocytes Relative 44 %   Lymphs Abs 4.5 (H) 0.7 - 4.0 K/uL   Monocytes Relative 7 %   Monocytes Absolute 0.7 0 - 1 K/uL   Eosinophils Relative 1 %   Eosinophils Absolute 0.1 0 - 0 K/uL   Basophils Relative 0 %   Basophils Absolute 0.0 0 - 0 K/uL   Immature Granulocytes 0 %   Abs Immature Granulocytes 0.03 0.00 - 0.07 K/uL    Comment: Performed at [facility], [address] [place], [place] [address]  Comprehensive metabolic panel     Status:  Abnormal   Collection Time: 05/31/19  8:07 PM  Result Value Ref Range   Sodium 139 135 - 145 mmol/L   Potassium 3.4 (L) 3.5 - 5.1 mmol/L   Chloride 105 98 - 111 mmol/L   CO2 24 22 - 32 mmol/L   Glucose, Bld 96 70 - 99 mg/dL    Comment: Glucose reference range applies only to samples taken after fasting for at least 8 hours.   BUN 8 6 - 20 mg/dL   Creatinine, Ser 0.93 0.44 - 1.00 mg/dL   Calcium 9.4 8.9 - 10.3 mg/dL   Total Protein 6.3 (L) 6.5 - 8.1 g/dL   Albumin 3.6 3.5 - 5.0 g/dL   AST 20 15 - 41 U/L   ALT 16 0 - 44 U/L   Alkaline Phosphatase 51 38 - 126 U/L   Total Bilirubin 0.4 0.3 - 1.2 mg/dL   GFR calc non Af Amer >60 >60 mL/min   GFR calc Af Amer >60 >60 mL/min   Anion gap 10 5 - 15    Comment: Performed at [facility], [place] [address], [place], [place] [address]  Urinalysis, Routine w reflex microscopic     Status: Abnormal   Collection Time: 05/31/19  8:12 PM  Result Value Ref Range   Color, Urine YELLOW YELLOW   APPearance CLEAR CLEAR   Specific Gravity,  Urine 1.011 1.005 - 1.030   pH 5.0 5.0 - 8.0   Glucose, UA NEGATIVE NEGATIVE mg/dL   Hgb urine dipstick MODERATE (A) NEGATIVE   Bilirubin Urine NEGATIVE NEGATIVE   Ketones, ur NEGATIVE NEGATIVE mg/dL   Protein, ur NEGATIVE NEGATIVE mg/dL   Nitrite NEGATIVE NEGATIVE   Leukocytes,Ua NEGATIVE NEGATIVE   RBC / HPF 0-5 0 - 5 RBC/hpf   WBC, UA 0-5 0 - 5 WBC/hpf   Bacteria, UA NONE SEEN NONE SEEN   Squamous Epithelial / LPF 0-5 0 - 5   Mucus PRESENT     Comment: Performed at [facility], [place] [address], [place], [place] [address]  TSH     Status: None   Collection Time: 05/31/19 11:21 PM  Result Value Ref Range   TSH 0.714 0.350 - 4.500 uIU/mL    Comment: Performed by a 3rd Generation assay with a functional sensitivity of <=0.01 uIU/mL. Performed at [facility], [place] [address] [place], [place] [address]   [id number]     Status: Abnormal   Collection Time: 07/20/19 11:34 AM  Result Value Ref  Range   Glucose 79 65 - 99 mg/dL   BUN 10 6 - 24 mg/dL   Creatinine, Ser 0.81 0.57 - 1.00 mg/dL   GFR calc non Af Amer 80 >59 mL/min/1.73   GFR calc Af Amer 93 >59 mL/min/1.73    Comment: **[facility] currently reports eGFR in compliance with the current**   recommendations of the [employer]. [facility] will   update reporting as new guidelines are published from the NKF-ASN   Task force.    BUN/Creatinine Ratio 12 9 - 23   Sodium 139 134 - 144 mmol/L   Potassium 3.9 3.5 - 5.2 mmol/L   Chloride 102 96 - 106 mmol/L   CO2 19 (L) 20 - 29 mmol/L   Calcium 9.8 8.7 - 10.2 mg/dL   Total Protein 7.1 6.0 - 8.5 g/dL   Albumin 4.7 3.8 - 4.9 g/dL   Globulin, Total 2.4 1.5 - 4.5 g/dL   Albumin/Globulin Ratio 2.0 1.2 - 2.2   Bilirubin Total 0.4 0.0 - 1.2 mg/dL   Alkaline Phosphatase 63 48 - 121 IU/L   AST 14 0 - 40 IU/L   ALT 7 0 - 32 IU/L  Lipid panel     Status: Abnormal   Collection Time: 07/20/19 11:34 AM  Result Value Ref Range   Cholesterol, Total 198 100 - 199 mg/dL   Triglycerides 128 0 - 149 mg/dL   HDL 35 (L) >39 mg/dL   VLDL Cholesterol Cal 23 5 - 40 mg/dL   LDL Chol Calc (NIH) 140 (H) 0 - 99 mg/dL   Chol/HDL Ratio 5.7 (H) 0.0 - 4.4 ratio    Comment:                                   T. Chol/HDL Ratio                                             Men  Women                               1/2 Avg.Risk  3.4    3.3  Avg.Risk  5.0    4.4                                2X Avg.Risk  9.6    7.1                                3X Avg.Risk 23.4   11.0   TSH     Status: None   Collection Time: 07/20/19 11:34 AM  Result Value Ref Range   TSH 0.631 0.450 - 4.500 uIU/mL  Anemia Profile B     Status: Abnormal   Collection Time: 07/20/19 11:34 AM  Result Value Ref Range   Total Iron Binding Capacity 361 250 - 450 ug/dL   UIBC 221 131 - 425 ug/dL   Iron 140 27 - 159 ug/dL   Iron Saturation 39 15 - 55 %   Ferritin 101 15.0 - 150.0 ng/mL    Vitamin B-12 266 232 - 1,245 pg/mL   Folate 3.1 >3.0 ng/mL    Comment: A serum folate concentration of less than 3.1 ng/mL is considered to represent clinical deficiency.    WBC 13.7 (H) 3.4 - 10.8 x10E3/uL   RBC 4.58 3.77 - 5.28 x10E6/uL   Hemoglobin 14.8 11.1 - 15.9 g/dL   Hematocrit 44.3 34.0 - 46.6 %   MCV 97 79 - 97 fL   MCH 32.3 26.6 - 33.0 pg   MCHC 33.4 31 - 35 g/dL   RDW 12.7 11.7 - 15.4 %   Platelets 355 150 - 450 x10E3/uL   Neutrophils 62 Not Estab. %   Lymphs 32 Not Estab. %   Monocytes 6 Not Estab. %   Eos 0 Not Estab. %   Basos 0 Not Estab. %   Neutrophils Absolute 8.3 (H) 1 - 7 x10E3/uL   Lymphocytes Absolute 4.3 (H) 0 - 3 x10E3/uL   Monocytes Absolute 0.9 0 - 0 x10E3/uL   EOS (ABSOLUTE) 0.1 0.0 - 0.4 x10E3/uL   Basophils Absolute 0.0 0 - 0 x10E3/uL   Immature Granulocytes 0 Not Estab. %   Immature Grans (Abs) 0.0 0.0 - 0.1 x10E3/uL   Retic Ct Pct 1.5 0.6 - 2.6 %  VITAMIN D 25 Hydroxy (Vit-D Deficiency, Fractures)     Status: None   Collection Time: 07/20/19 11:34 AM  Result Value Ref Range   Vit D, 25-Hydroxy 42.9 30.0 - 100.0 ng/mL    Comment: Vitamin D deficiency has been defined by the Institute of Medicine and an Endocrine Society practice guideline as a level of serum 25-OH vitamin D less than 20 ng/mL (1,2). The Endocrine Society went on to further define vitamin D insufficiency as a level between 21 and 29 ng/mL (2). 1. IOM (Institute of Medicine). 2010. Dietary reference    intakes for calcium and D. [place]: The    [employer]. 2. Holick MF, Binkley Holt, Bischoff-Ferrari HA, et al.    Evaluation, treatment, and prevention of vitamin D    deficiency: an Endocrine Society clinical practice    guideline. JCEM. 2011 Jul; 96(7):1911-30.   Surgical pathology     Status: None   Collection Time: 07/29/19 12:00 AM  Result Value Ref Range   SURGICAL PATHOLOGY      Surgical Pathology CASE: [id number] PATIENT: Jakiah Bantz Flow  Pathology Report     Clinical history: Leukocytosis  DIAGNOSIS:  - No monoclonal B-cell population or abnormal T-cell phenotype identified.   GATING AND PHENOTYPIC ANALYSIS:  Gated population: Flow cytometric immunophenotyping is performed using antibodies to the antigens listed in the table below. Electronic gates are placed around a cell cluster displaying light scatter properties corresponding to: lymphocytes  Abnormal Cells in gated population: N/A  Phenotype of Abnormal Cells: N/A                        Lymphoid Antigens       Myeloid Antigens Miscellaneous CD2  tested    CD10 tested    CD11b     ND   CD45 tested CD3  tested    CD19 tested    CD11c     ND   HLA-Dr    ND CD4  tested    CD20 tested    CD13 ND   CD34 tested CD5  tested    CD22 ND   CD14 ND   CD38 tested CD7  tested    CD79b     ND   CD15 ND   CD138     ND CD8  tested    CD103     ND   CD16 ND   TdT  ND CD25  ND   CD200     tested    CD33 ND   CD123     ND TCRab     ND   sKappa    tested    CD64 ND   CD41 ND TCRgd     tested    sLambda   tested    CD117     ND   CD61 ND CD56 tested    cKappa    ND   MPO  ND   CD71 ND CD57 ND   cLambda   ND        [id number]      GROSS DESCRIPTION:  One lavender top tube submitted from [facility] for lymphoma testing.    Final Diagnosis performed by Susanne Greenhouse, MD.   Electronically signed 07/29/2019 Technical and / or Professional components performed at [facility], [place] [address] [place], [place] [address].  The above tests were developed and their performance characteristics determined by the [facility] system for the physical and immunophenotypic characterization of cell populations. They have not been cleared by the U.S. Food and Drug administration. The  FDA has determined that such clearance or approval is not necessary. This test is used for clinical purposes. It should not be  regarded as Engineer, production or for  research   CBC with Differential/Platelet     Status: Abnormal   Collection Time: 07/29/19  9:21 AM  Result Value Ref Range   WBC 12.0 (H) 4.0 - 10.5 K/uL   RBC 4.28 3.87 - 5.11 MIL/uL   Hemoglobin 13.7 12.0 - 15.0 g/dL   HCT 42.4 36 - 46 %   MCV 99.1 80.0 - 100.0 fL   MCH 32.0 26.0 - 34.0 pg   MCHC 32.3 30.0 - 36.0 g/dL   RDW 13.4 11.5 - 15.5 %   Platelets 355 150 - 400 K/uL   nRBC 0.0 0.0 - 0.2 %   Neutrophils Relative % 52 %   Neutro Abs 6.2 1.7 - 7.7 K/uL   Lymphocytes Relative 41 %   Lymphs Abs 4.9 (H) 0.7 - 4.0 K/uL   Monocytes Relative 6 %   Monocytes Absolute 0.7  0 - 1 K/uL   Eosinophils Relative 1 %   Eosinophils Absolute 0.1 0 - 0 K/uL   Basophils Relative 0 %   Basophils Absolute 0.0 0 - 0 K/uL   Immature Granulocytes 0 %   Abs Immature Granulocytes 0.04 0.00 - 0.07 K/uL    Comment: Performed at [facility], [address] [place], [place] [address]  Lactate dehydrogenase     Status: None   Collection Time: 07/29/19  9:21 AM  Result Value Ref Range   LDH 153 98 - 192 U/L    Comment: Performed at [facility], [address], [place], [place] [address]    RADIOGRAPHIC STUDIES: I have personally reviewed the radiological images as listed and agreed with the findings in the report.  ASSESSMENT:  1.  Leukocytosis: -She has history of chronic leukocytosis since 2008, both lymphocytic and granulocytic. -Current active smoker, 1 pack/day for 30 years. -Developed sweating episodes in the head for the past 1 month.  20 pound weight loss in the last 6 months, unintentional.  2.  Family history: -Brother died of brain tumor.  Father had abdominal cancer.  Younger sister died of colon cancer.  Paternal uncle died of throat cancer.  Paternal grandmother died of colon cancer.   PLAN:  1.  Leukocytosis: -She is mild to moderate leukocytosis a few years duration. -There is elevation of both lymphocytes and neutrophils.  We will repeat CBC today.  We will check for  myeloproliferative disorders by checking JAK2 V6 1 7 of genotype, BCR/ABL.  We will also send for flow cytometry to evaluate for lymphoproliferative disorders. -We will see her back in 3 to 4 weeks to discuss results.  2.  Smoking history: -She is a current active smoker, 1 pack/day for 30 years. -Because of unintentional weight loss, she will require imaging of the chest.  I have talked to her her about our lung cancer screening program.  She is agreeable.  I will make a referral.   All questions were answered. The patient knows to call the clinic with any problems, questions or concerns.   Derek Jack, MD 07/29/19 7:14 PM  [place] [phone]   I, Milinda Antis, am acting as a scribe for Dr. Sanda Linger.  I, Derek Jack MD, have reviewed the above documentation for accuracy and completeness, and I agree with the above.

## 2019-07-29 ENCOUNTER — Encounter (HOSPITAL_COMMUNITY): Payer: Self-pay | Admitting: Hematology

## 2019-07-29 ENCOUNTER — Inpatient Hospital Stay (HOSPITAL_COMMUNITY): Payer: Medicaid Other | Attending: Hematology | Admitting: Hematology

## 2019-07-29 ENCOUNTER — Inpatient Hospital Stay (HOSPITAL_COMMUNITY): Payer: Medicaid Other

## 2019-07-29 VITALS — BP 118/75 | HR 51 | Temp 97.3°F | Resp 18 | Wt 137.8 lb

## 2019-07-29 DIAGNOSIS — Z800 Family history of malignant neoplasm of digestive organs: Secondary | ICD-10-CM | POA: Insufficient documentation

## 2019-07-29 DIAGNOSIS — F17210 Nicotine dependence, cigarettes, uncomplicated: Secondary | ICD-10-CM | POA: Insufficient documentation

## 2019-07-29 DIAGNOSIS — D72829 Elevated white blood cell count, unspecified: Secondary | ICD-10-CM

## 2019-07-29 DIAGNOSIS — Z808 Family history of malignant neoplasm of other organs or systems: Secondary | ICD-10-CM | POA: Insufficient documentation

## 2019-07-29 LAB — CBC WITH DIFFERENTIAL/PLATELET
Abs Immature Granulocytes: 0.04 10*3/uL (ref 0.00–0.07)
Basophils Absolute: 0 10*3/uL (ref 0.0–0.1)
Basophils Relative: 0 %
Eosinophils Absolute: 0.1 10*3/uL (ref 0.0–0.5)
Eosinophils Relative: 1 %
HCT: 42.4 % (ref 36.0–46.0)
Hemoglobin: 13.7 g/dL (ref 12.0–15.0)
Immature Granulocytes: 0 %
Lymphocytes Relative: 41 %
Lymphs Abs: 4.9 10*3/uL — ABNORMAL HIGH (ref 0.7–4.0)
MCH: 32 pg (ref 26.0–34.0)
MCHC: 32.3 g/dL (ref 30.0–36.0)
MCV: 99.1 fL (ref 80.0–100.0)
Monocytes Absolute: 0.7 10*3/uL (ref 0.1–1.0)
Monocytes Relative: 6 %
Neutro Abs: 6.2 10*3/uL (ref 1.7–7.7)
Neutrophils Relative %: 52 %
Platelets: 355 10*3/uL (ref 150–400)
RBC: 4.28 MIL/uL (ref 3.87–5.11)
RDW: 13.4 % (ref 11.5–15.5)
WBC: 12 10*3/uL — ABNORMAL HIGH (ref 4.0–10.5)
nRBC: 0 % (ref 0.0–0.2)

## 2019-07-29 LAB — LACTATE DEHYDROGENASE: LDH: 153 U/L (ref 98–192)

## 2019-07-29 LAB — SURGICAL PATHOLOGY

## 2019-07-29 NOTE — Patient Instructions (Signed)
[name] at [facility] Discharge Instructions  You were seen today by Dr. Delton Coombes. He went over your recent results. You will receive a referral for a CT screening scan for lung cancer. Please consider smoking cessation. Dr. Delton Coombes will see you back in 1 month for labs and follow up.   Thank you for choosing [facility] at [facility] to provide your oncology and hematology care.  To afford each patient quality time with our provider, please arrive at least 15 minutes before your scheduled appointment time.   If you have a lab appointment with the [facility] please come in thru the Main Entrance and check in at the main information desk  You need to re-schedule your appointment should you arrive 10 or more minutes late.  We strive to give you quality time with our providers, and arriving late affects you and other patients whose appointments are after yours.  Also, if you no show three or more times for appointments you may be dismissed from the clinic at the providers discretion.     Again, thank you for choosing [facility].  Our hope is that these requests will decrease the amount of time that you wait before being seen by our physicians.       _____________________________________________________________  Should you have questions after your visit to [facility], please contact our office at [phone] between the hours of 8:00 a.m. and 4:30 p.m.  Voicemails left after 4:00 p.m. will not be returned until the following business day.  For prescription refill requests, have your pharmacy contact our office and allow 72 hours.    Cancer Center Support Programs:   > Cancer Support Group  2nd Tuesday of the month 1pm-2pm, Journey Room

## 2019-08-02 DIAGNOSIS — H5213 Myopia, bilateral: Secondary | ICD-10-CM | POA: Diagnosis not present

## 2019-08-02 LAB — JAK2 GENOTYPR

## 2019-08-05 ENCOUNTER — Other Ambulatory Visit: Payer: Self-pay | Admitting: *Deleted

## 2019-08-05 LAB — BCR-ABL1 FISH
Cells Analyzed: 200
Cells Counted: 200

## 2019-08-05 MED ORDER — PROVENTIL HFA 108 (90 BASE) MCG/ACT IN AERS: 2.0000 | INHALATION_SPRAY | Freq: Four times a day (QID) | RESPIRATORY_TRACT | 2 refills | Status: DC | PRN

## 2019-08-06 LAB — FLOW CYTOMETRY

## 2019-08-07 ENCOUNTER — Other Ambulatory Visit: Payer: Self-pay

## 2019-08-07 ENCOUNTER — Inpatient Hospital Stay (HOSPITAL_BASED_OUTPATIENT_CLINIC_OR_DEPARTMENT_OTHER): Payer: Medicaid Other | Admitting: Nurse Practitioner

## 2019-08-07 ENCOUNTER — Telehealth: Payer: Self-pay | Admitting: Family

## 2019-08-07 DIAGNOSIS — Z720 Tobacco use: Secondary | ICD-10-CM | POA: Diagnosis not present

## 2019-08-07 MED ORDER — NICOTINE 21 MG/24HR TD PT24: 21.0000 mg | MEDICATED_PATCH | Freq: Every day | TRANSDERMAL | 3 refills | Status: DC

## 2019-08-07 NOTE — Telephone Encounter (Signed)
Per med HX we have given this in the past Please advise

## 2019-08-07 NOTE — Progress Notes (Signed)
[facility] NOTE  Patient Care Team: [name], FNP as PCP - General (Family Medicine) Derek Jack, MD as Consulting Physician (Hematology)  CHIEF COMPLAINTS/PURPOSE OF CONSULTATION: Shared decision making visit for lung cancer screening  HISTORY OF PRESENTING ILLNESS:  [name] 59 y.o. female presents today for shared decision making visit for lung cancer screening.  Patient has a past medical history for anxiety, back pain, depression, hyperlipidemia and tobacco use.  Patient started smoking at the age of 46.  She is still current smoker.  She smokes about 1/2 pack/day.  She is currently trying to quit.  She is asking her PCP for nicotine patches.  She reports an occasional cough denies any shortness of breath.  She denies any alcohol or illicit drug use.  Patient has a significant family history of cancer in her family.  MEDICAL HISTORY:  Past Medical History:  Diagnosis Date  . Anxiety   . Arthritis   . Back pain   . Depression   . Hip pain   . HSV-1 infection   . Hyperlipidemia   . Right leg pain   . Sciatica   . Tobacco user     SURGICAL HISTORY: Past Surgical History:  Procedure Laterality Date  . BUNIONECTOMY    . CATARACT EXTRACTION    . CESAREAN SECTION    . COLONOSCOPY W/ POLYPECTOMY    . ELBOW SURGERY    . NOVASURE ABLATION    . RHYTIDECTOMY NECK / CHEEK / CHIN    . TONSILLECTOMY    . TUBAL LIGATION      SOCIAL HISTORY: Social History   Socioeconomic History  . Marital status: Divorced    Spouse name: Not on file  . Number of children: 2  . Years of education: Not on file  . Highest education level: Not on file  Occupational History  . Occupation: disabled  Tobacco Use  . Smoking status: Current Every Day Smoker    Packs/day: 1.00    Types: Cigarettes    Start date: 01/23/1976  . Smokeless tobacco: Never Used  Vaping Use  . Vaping Use: Never used  Substance and Sexual Activity  . Alcohol use: No  .  Drug use: No  . Sexual activity: Yes  Other Topics Concern  . 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    Social Determinants of Health   Financial Resource Strain:   . Difficulty of Paying Living Expenses:   Food Insecurity:   . Worried About Charity fundraiser in the Last Year:   . Arboriculturist in the Last Year:   Transportation Needs:   . Film/video editor (Medical):   Marland Kitchen Lack of Transportation (Non-Medical):   Physical Activity:   . Days of Exercise per Week:   . Minutes of Exercise per Session:   Stress:   . Feeling of Stress :   Social Connections:   . Frequency of Communication with Friends and Family:   . Frequency of Social Gatherings with Friends and Family:   . Attends Religious Services:   . Active Member of Clubs or Organizations:   . Attends Archivist Meetings:   Marland Kitchen Marital Status:   Intimate Partner Violence:   . Fear of Current or Ex-Partner:   . Emotionally Abused:   Marland Kitchen Physically Abused:   . Sexually Abused:  FAMILY HISTORY: Family History  Problem Relation Age of Onset  . Brain cancer Mother   . Cancer - Other Father        Abdominal   . Colon cancer Sister   . Colon cancer Paternal Grandmother     ALLERGIES:  is allergic to codeine, meperidine hcl, and sulfa antibiotics.  MEDICATIONS:  Current Outpatient Medications  Medication Sig Dispense Refill  . ALPRAZolam (XANAX) 1 MG tablet Take 1.5 tablets (1.5 mg total) by mouth daily as needed for anxiety. (Patient taking differently: Take 1.5 mg by mouth 2 (two) times daily. ) 45 tablet 3  . aspirin EC 81 MG tablet Take 81 mg by mouth daily.    . baclofen (LIORESAL) 10 MG tablet Take 10 mg by mouth 3 (three) times daily.     . DULERA 200-5 MCG/ACT AERO Inhale 2 puffs into the lungs daily. 13 g 2  . gabapentin (NEURONTIN) 300 MG capsule Take 1 capsule (300 mg total) by mouth 3 (three) times daily.  90 capsule 3  . HYDROcodone-acetaminophen (NORCO) 10-325 MG tablet Take 1 tablet by mouth every 6 (six) hours as needed for pain. (Patient not taking: Reported on 07/28/2019)    . megestrol (MEGACE) 40 MG/ML suspension Take 20 mg by mouth 2 (two) times daily.    Marland Kitchen PROVENTIL HFA 108 (90 Base) MCG/ACT inhaler Inhale 2 puffs into the lungs every 6 (six) hours as needed for wheezing. Brand Name Medically Necessary 6.7 g 2  . RETIN-A 0.01 % gel Apply topically at bedtime. 45 g 3  . Vitamin D, Ergocalciferol, (DRISDOL) 1.25 MG (50000 UNIT) CAPS capsule Take 50,000 Units by mouth every 7 (seven) days.     No current facility-administered medications for this visit.    LABORATORY DATA:  I have reviewed the data as listed Lab Results  Component Value Date   WBC 12.0 (H) 07/29/2019   HGB 13.7 07/29/2019   HCT 42.4 07/29/2019   MCV 99.1 07/29/2019   PLT 355 07/29/2019     Chemistry      Component Value Date/Time   NA 139 07/20/2019 1134   K 3.9 07/20/2019 1134   CL 102 07/20/2019 1134   CO2 19 (L) 07/20/2019 1134   BUN 10 07/20/2019 1134   CREATININE 0.81 07/20/2019 1134      Component Value Date/Time   CALCIUM 9.8 07/20/2019 1134   ALKPHOS 63 07/20/2019 1134   AST 14 07/20/2019 1134   ALT 7 07/20/2019 1134   BILITOT 0.4 07/20/2019 1134      ASSESSMENT & PLAN:  Tobacco user 1.  Tobacco abuse: -This patient meets criteria for low-dose CT lung cancer screening. -She is asymptomatic for any signs and symptoms of lung cancer. -Shared decision making visit discussion including risk and benefits of screening, potential for follow-up, diagnostic testing for abnormal labs, potential for false positive test, over diagnosis and discussion about total radiation exposure. -Patient stated willingness to undergo diagnostics and treatment as needed. -Patient was counseled on smoking cessation to decrease risk of lung cancer, pulmonary disease, heart disease and stroke. -Patient was given a  resource card with information on receiving free nicotine replacement therapy and information about free smoking cessation classes. -Patient will proceed for LD CT scan today and follow-up with PCP.   All questions were answered. The patient knows to call the clinic with any problems, questions or concerns. I spent 10 minutes counseling the patient face to face. The total time spent in the appointment was 20  minutes and more than 50% was on counseling.     Glennie Isle, NP-C 08/07/2019 10:41 AM

## 2019-08-07 NOTE — Assessment & Plan Note (Signed)
1.  Tobacco abuse: -This patient meets criteria for low-dose CT lung cancer screening. -She is asymptomatic for any signs and symptoms of lung cancer. -Shared decision making visit discussion including risk and benefits of screening, potential for follow-up, diagnostic testing for abnormal labs, potential for false positive test, over diagnosis and discussion about total radiation exposure. -Patient stated willingness to undergo diagnostics and treatment as needed. -Patient was counseled on smoking cessation to decrease risk of lung cancer, pulmonary disease, heart disease and stroke. -Patient was given a resource card with information on receiving free nicotine replacement therapy and information about free smoking cessation classes. -Patient will proceed for LD CT scan today and follow-up with PCP.

## 2019-08-07 NOTE — Addendum Note (Signed)
Addended by: Glennie Isle on: 08/07/2019 11:09 AM   Modules accepted: Orders

## 2019-08-07 NOTE — Telephone Encounter (Signed)
Nicotine patches Prescription sent to pharmacy.

## 2019-08-07 NOTE — Telephone Encounter (Signed)
Pt aware.

## 2019-08-07 NOTE — Telephone Encounter (Signed)
Pt wants to know if Alyse Low can send in Rx for patches to help pt quit smoking.

## 2019-08-10 ENCOUNTER — Encounter (HOSPITAL_COMMUNITY): Payer: Self-pay | Admitting: *Deleted

## 2019-08-10 NOTE — Progress Notes (Unsigned)
Patient was screened and seen by our NP for shared decision making visit on 08/07/19.   She will be scheduled for her CT on a later date.

## 2019-08-11 ENCOUNTER — Ambulatory Visit: Payer: Medicaid Other | Admitting: Nurse Practitioner

## 2019-08-12 ENCOUNTER — Ambulatory Visit (HOSPITAL_COMMUNITY): Payer: Medicaid Other | Admitting: Hematology

## 2019-08-21 ENCOUNTER — Ambulatory Visit: Payer: Medicaid Other | Admitting: Family

## 2019-08-26 ENCOUNTER — Other Ambulatory Visit: Payer: Self-pay

## 2019-08-26 ENCOUNTER — Ambulatory Visit (HOSPITAL_COMMUNITY)
Admission: RE | Admit: 2019-08-26 | Discharge: 2019-08-26 | Disposition: A | Discharge status: Home / Self Care | Payer: Medicaid Other | Source: Ambulatory Visit | Attending: Nurse Practitioner | Admitting: Nurse Practitioner

## 2019-08-26 DIAGNOSIS — Z720 Tobacco use: Secondary | ICD-10-CM | POA: Diagnosis not present

## 2019-08-27 ENCOUNTER — Encounter (HOSPITAL_COMMUNITY): Payer: Self-pay | Admitting: *Deleted

## 2019-08-27 NOTE — Progress Notes (Signed)
Patient notified via mail of LDCT l ung cancer screening results with recommendations to follow up in 12 months.  Also notified of incidental findings and need to follow up with PCP.  Patient's referring provider was sent a copy of results.    IMPRESSION: Lung-RADS 2, benign appearance or behavior. Continue annual screening with low-dose chest CT without contrast in 12 months.  Aortic Atherosclerosis (ICD10-I70.0) and Emphysema (ICD10-J43.9).   

## 2019-09-02 ENCOUNTER — Inpatient Hospital Stay (HOSPITAL_COMMUNITY): Payer: Medicaid Other | Attending: Hematology | Admitting: Hematology

## 2019-09-02 ENCOUNTER — Other Ambulatory Visit: Payer: Self-pay

## 2019-09-02 VITALS — BP 110/77 | HR 65 | Temp 97.3°F | Resp 18 | Wt 136.9 lb

## 2019-09-02 DIAGNOSIS — F17210 Nicotine dependence, cigarettes, uncomplicated: Secondary | ICD-10-CM | POA: Insufficient documentation

## 2019-09-02 DIAGNOSIS — Z79899 Other long term (current) drug therapy: Secondary | ICD-10-CM | POA: Insufficient documentation

## 2019-09-02 DIAGNOSIS — F419 Anxiety disorder, unspecified: Secondary | ICD-10-CM | POA: Diagnosis not present

## 2019-09-02 DIAGNOSIS — F329 Major depressive disorder, single episode, unspecified: Secondary | ICD-10-CM | POA: Insufficient documentation

## 2019-09-02 DIAGNOSIS — D72829 Elevated white blood cell count, unspecified: Secondary | ICD-10-CM

## 2019-09-02 MED ORDER — VITAMIN D (ERGOCALCIFEROL) 1.25 MG (50000 UNIT) PO CAPS: 50000.0000 [IU] | ORAL_CAPSULE | ORAL | 6 refills | Status: DC

## 2019-09-02 MED ORDER — FOLIC ACID 1 MG PO TABS
1.0000 mg | ORAL_TABLET | Freq: Every day | ORAL | 6 refills | Status: DC
Start: 2019-09-02 — End: 2020-03-07

## 2019-09-02 MED ORDER — CYANOCOBALAMIN 1000 MCG/ML IJ SOLN
1000.0000 ug | Freq: Once | INTRAMUSCULAR | Status: AC
  Administered 2019-09-02: 1000 ug via INTRAMUSCULAR
  Filled 2019-09-02: qty 1

## 2019-09-02 NOTE — Patient Instructions (Signed)
[place] at [facility] Discharge Instructions  You were seen today by Dr. Delton Coombes. He went over your recent results; your blood work revealed no presence of leukemia. You received a vitamin B12 injection today; take folic acid daily and buy vitamin B12 over the counter and take 1 mg daily. Continue being physically active as much as possible. Dr. Delton Coombes will see you back in 4 months for labs and follow up.   Thank you for choosing [place] at [facility] to provide your oncology and hematology care.  To afford each patient quality time with our provider, please arrive at least 15 minutes before your scheduled appointment time.   If you have a lab appointment with the [facility] please come in thru the Main Entrance and check in at the main information desk  You need to re-schedule your appointment should you arrive 10 or more minutes late.  We strive to give you quality time with our providers, and arriving late affects you and other patients whose appointments are after yours.  Also, if you no show three or more times for appointments you may be dismissed from the clinic at the providers discretion.     Again, thank you for choosing [facility].  Our hope is that these requests will decrease the amount of time that you wait before being seen by our physicians.       _____________________________________________________________  Should you have questions after your visit to [facility], please contact our office at [phone] between the hours of 8:00 a.m. and 4:30 p.m.  Voicemails left after 4:00 p.m. will not be returned until the following business day.  For prescription refill requests, have your pharmacy contact our office and allow 72 hours.    Cancer Center Support Programs:   > Cancer Support Group  2nd Tuesday of the month 1pm-2pm, Journey Room

## 2019-09-02 NOTE — Progress Notes (Signed)
[name], [name]   CLINIC:  Medical Oncology/Hematology  PCP:  [name], [address] / [place] [address]  [phone]  REASON FOR VISIT:  Follow-up for leukocytosis  PRIOR THERAPY: None  CURRENT THERAPY: Observation  INTERVAL HISTORY:  [name], a 59 y.o. female, returns for routine follow-up for her leukocytosis. [name] was last seen on 07/29/2019.  Today [name] complains that [name] has felt fatigued with no appetite ever since receiving her first COVID vaccine; [name] gets dizzy and weak when [name] moves around even mildly. [name] reports that [name] used to take vitamin D once weekly, but her PCP wants her to return before prescribing it again.   REVIEW OF SYSTEMS:  Review of Systems  Constitutional: Positive for appetite change (depleted) and fatigue (depleted).  Respiratory: Positive for shortness of breath.   Cardiovascular: Positive for chest pain (chest heaviness).  Gastrointestinal: Positive for abdominal pain (epigastric pain).  Musculoskeletal: Positive for back pain (7/10 back pain).  Neurological: Positive for dizziness, headaches and numbness (hands & feet).  Psychiatric/Behavioral: The patient is nervous/anxious.   All other systems reviewed and are negative.   PAST MEDICAL/SURGICAL HISTORY:  Past Medical History:  Diagnosis Date  . Anxiety   . Arthritis   . Back pain   . Depression   . Hip pain   . HSV-1 infection   . Hyperlipidemia   . Right leg pain   . Sciatica   . Tobacco user    Past Surgical History:  Procedure Laterality Date  . BUNIONECTOMY    . CATARACT EXTRACTION    . CESAREAN SECTION    . COLONOSCOPY W/ POLYPECTOMY    . ELBOW SURGERY    . NOVASURE ABLATION    . RHYTIDECTOMY NECK / CHEEK / CHIN    . TONSILLECTOMY    . TUBAL LIGATION      SOCIAL HISTORY:  Social History   Socioeconomic History  . Marital status: Divorced    Spouse name: Not on file  .  Number of children: 2  . Years of education: Not on file  . Highest education level: Not on file  Occupational History  . Occupation: disabled  Tobacco Use  . Smoking status: Current Every Day Smoker    Packs/day: 1.00    Types: Cigarettes    Start date: 01/23/1976  . Smokeless tobacco: Never Used  Vaping Use  . Vaping Use: Never used  Substance and Sexual Activity  . Alcohol use: No  . Drug use: No  . Sexual activity: Yes  Other Topics Concern  . 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    Social Determinants of Health   Financial Resource Strain:   . Difficulty of Paying Living Expenses:   Food Insecurity:   . Worried About Charity fundraiser in the Last Year:   . Arboriculturist in the Last Year:   Transportation Needs:   . Film/video editor (Medical):   Marland Kitchen Lack of Transportation (Non-Medical):   Physical Activity:   . Days of Exercise per Week:   . Minutes of Exercise per Session:   Stress:   . Feeling of Stress :   Social Connections:   . Frequency of Communication with Friends and Family:   . Frequency of Social Gatherings with Friends and  Family:   . Attends Religious Services:   . Active Member of Clubs or Organizations:   . Attends Archivist Meetings:   Marland Kitchen Marital Status:   Intimate Partner Violence:   . Fear of Current or Ex-Partner:   . Emotionally Abused:   Marland Kitchen Physically Abused:   . Sexually Abused:     FAMILY HISTORY:  Family History  Problem Relation Age of Onset  . Brain cancer Mother   . Cancer - Other Father        Abdominal   . Colon cancer Sister   . Colon cancer Paternal Grandmother     CURRENT MEDICATIONS:  Current Outpatient Medications  Medication Sig Dispense Refill  . ALPRAZolam (XANAX) 1 MG tablet Take 1.5 tablets (1.5 mg total) by mouth daily as needed for anxiety. (Patient taking differently: Take 1.5 mg by mouth 2 (two) times daily.  ) 45 tablet 3  . aspirin EC 81 MG tablet Take 81 mg by mouth daily.    . baclofen (LIORESAL) 10 MG tablet Take 10 mg by mouth 3 (three) times daily.     Marland Kitchen gabapentin (NEURONTIN) 300 MG capsule Take 1 capsule (300 mg total) by mouth 3 (three) times daily. 90 capsule 3  . HYDROcodone-acetaminophen (NORCO) 10-325 MG tablet Take 1 tablet by mouth every 6 (six) hours as needed for pain.     . nicotine (NICODERM CQ - DOSED IN MG/24 HOURS) 21 mg/24hr patch Place 1 patch (21 mg total) onto the skin daily. 30 patch 3  . PROVENTIL HFA 108 (90 Base) MCG/ACT inhaler Inhale 2 puffs into the lungs every 6 (six) hours as needed for wheezing. Brand Name Medically Necessary 6.7 g 2  . RETIN-A 0.01 % gel Apply topically at bedtime. 45 g 3   No current facility-administered medications for this visit.    ALLERGIES:  Allergies  Allergen Reactions  . Codeine Itching  . Meperidine Hcl Other (See Comments)    Makes her feel strange  . Sulfa Antibiotics Other (See Comments)    Joint pain    PHYSICAL EXAM:  Performance status (ECOG): 1 - Symptomatic but completely ambulatory  Vitals:   09/02/19 0803  BP: 110/77  Pulse: 65  Resp: 18  Temp: (!) 97.3 F (36.3 C)  SpO2: 97%   Wt Readings from Last 3 Encounters:  09/02/19 136 lb 14.4 oz (62.1 kg)  07/29/19 137 lb 12.8 oz (62.5 kg)  07/20/19 135 lb (61.2 kg)   Physical Exam Vitals reviewed.  Constitutional:      Appearance: Normal appearance.  Cardiovascular:     Rate and Rhythm: Normal rate and regular rhythm.     Pulses: Normal pulses.     Heart sounds: Normal heart sounds.  Pulmonary:     Effort: Pulmonary effort is normal.     Breath sounds: Normal breath sounds.  Abdominal:     Palpations: Abdomen is soft. There is no hepatomegaly, splenomegaly or mass.     Tenderness: There is no abdominal tenderness.  Musculoskeletal:     Right lower leg: No edema.     Left lower leg: No edema.  Lymphadenopathy:     Upper Body:     Right upper body:  No axillary adenopathy.     Left upper body: No axillary adenopathy.  Neurological:     General: No focal deficit present.     Mental Status: [name] is alert and oriented to person, place, and time.  Psychiatric:  Mood and Affect: Mood normal.        Behavior: Behavior normal.     LABORATORY DATA:  I have reviewed the labs as listed.  CBC Latest Ref Rng & Units 07/29/2019 07/20/2019 05/31/2019  WBC 4.0 - 10.5 K/uL 12.0(H) 13.7(H) 10.4  Hemoglobin 12.0 - 15.0 g/dL 13.7 14.8 12.7  Hematocrit 36 - 46 % 42.4 44.3 38.0  Platelets 150 - 400 K/uL 355 355 343   CMP Latest Ref Rng & Units 07/20/2019 05/31/2019 12/30/2018  Glucose 65 - 99 mg/dL 79 96 111(H)  BUN 6 - 24 mg/dL 10 8 6   Creatinine 0.57 - 1.00 mg/dL 0.81 0.93 0.75  Sodium 134 - 144 mmol/L 139 139 141  Potassium 3.5 - 5.2 mmol/L 3.9 3.4(L) 3.6  Chloride 96 - 106 mmol/L 102 105 103  CO2 20 - 29 mmol/L 19(L) 24 22  Calcium 8.7 - 10.2 mg/dL 9.8 9.4 9.5  Total Protein 6.0 - 8.5 g/dL 7.1 6.3(L) -  Total Bilirubin 0.0 - 1.2 mg/dL 0.4 0.4 -  Alkaline Phos 48 - 121 IU/L 63 51 -  AST 0 - 40 IU/L 14 20 -  ALT 0 - 32 IU/L 7 16 -      Component Value Date/Time   RBC 4.28 07/29/2019 0921   MCV 99.1 07/29/2019 0921   MCV 97 07/20/2019 1134   MCH 32.0 07/29/2019 0921   MCHC 32.3 07/29/2019 0921   RDW 13.4 07/29/2019 0921   RDW 12.7 07/20/2019 1134   LYMPHSABS 4.9 (H) 07/29/2019 0921   LYMPHSABS 4.3 (H) 07/20/2019 1134   MONOABS 0.7 07/29/2019 0921   EOSABS 0.1 07/29/2019 0921   EOSABS 0.1 07/20/2019 1134   BASOSABS 0.0 07/29/2019 0921   BASOSABS 0.0 07/20/2019 1134   Lab Results  Component Value Date   LDH 153 07/29/2019   Lab Results  Component Value Date   VD25OH 42.9 07/20/2019    DIAGNOSTIC IMAGING:  I have independently reviewed the scans and discussed with the patient. CT CHEST LUNG CA SCREEN LOW DOSE W/O CM  Result Date: 08/26/2019 CLINICAL DATA:  59 year old female current smoker, with 32 pack-year history of  smoking, for initial lung cancer screening EXAM: CT CHEST WITHOUT CONTRAST LOW-DOSE FOR LUNG CANCER SCREENING TECHNIQUE: Multidetector CT imaging of the chest was performed following the standard protocol without IV contrast. COMPARISON:  None. FINDINGS: Cardiovascular: The heart is normal in size. No pericardial effusion. No evidence of thoracic aortic aneurysm. Mild atherosclerotic calcifications of the aortic arch. Mediastinum/Nodes: No suspicious mediastinal lymphadenopathy. Visualized thyroid is unremarkable. Lungs/Pleura: Biapical pleural-parenchymal scarring. Moderate centrilobular and paraseptal emphysematous changes, upper lung predominant. Mild scarring/atelectasis inferiorly in the right middle lobe. No focal consolidation. 3.2 mm subpleural nodule in the posterior right lower lobe. No pleural effusion or pneumothorax. Upper Abdomen: Visualized upper abdomen is grossly unremarkable. Musculoskeletal: Visualized osseous structures are within normal limits. IMPRESSION: Lung-RADS 2, benign appearance or behavior. Continue annual screening with low-dose chest CT without contrast in 12 months. Aortic Atherosclerosis (ICD10-I70.0) and Emphysema (ICD10-J43.9). Electronically Signed   By: Julian Hy M.D.   On: 08/26/2019 13:10     ASSESSMENT:  1.  Leukocytosis: -[name] has history of chronic leukocytosis since 2008, both lymphocytic and granulocytic. -Current active smoker, 1 pack/day for 30 years. -Developed sweating episodes in the head for the past 1 month.  20 pound weight loss in the last 6 months, unintentional. -BCR/ABL and JAK2 V617F is negative.  Flow cytometry was negative.  2.  Family history: -Brother died  of brain tumor.  Father had abdominal cancer.  Younger sister died of colon cancer.  Paternal uncle died of throat cancer.  Paternal grandmother died of colon cancer.   PLAN:  1.  Leukocytosis: -CBC on 07/29/2019 showed white count 12, elevated lymphocyte count. -We discussed  results of JAK2 V617F which was negative.  BCR/ABL was negative.  Flow cytometry was negative. -Most likely etiology for her leukocytosis is smoking.  No further work-up needed at this time. -If there is significant changes, will consider bone marrow biopsy. -We will reevaluate her in 4 months with labs.  2.  Smoking history: -Current active smoker, 1 pack/day for 30 years. -CT lung cancer screening protocol on 08/26/2019 was lung RADS 2.  3.  Severe fatigue: -[name] complains of severe fatigue started after COVID-19 injection. -Labs on 07/12/2019 shows borderline folic acid and C38.  I have given her B12 injection today and told her to start B12 1 mg tablet daily.  Folic acid 1 mg tablet daily will be started.  Orders placed this encounter:  No orders of the defined types were placed in this encounter.    Derek Jack, MD [place] [phone]   I, Milinda Antis, am acting as a scribe for Dr. Sanda Linger.  I, Derek Jack MD, have reviewed the above documentation for accuracy and completeness, and I agree with the above.

## 2019-09-07 DIAGNOSIS — M5416 Radiculopathy, lumbar region: Secondary | ICD-10-CM | POA: Diagnosis not present

## 2019-09-07 DIAGNOSIS — M545 Low back pain: Secondary | ICD-10-CM | POA: Diagnosis not present

## 2019-09-11 DIAGNOSIS — R0789 Other chest pain: Secondary | ICD-10-CM | POA: Diagnosis not present

## 2019-09-11 DIAGNOSIS — J40 Bronchitis, not specified as acute or chronic: Secondary | ICD-10-CM | POA: Diagnosis not present

## 2019-09-11 DIAGNOSIS — R05 Cough: Secondary | ICD-10-CM | POA: Diagnosis not present

## 2019-09-14 ENCOUNTER — Ambulatory Visit (INDEPENDENT_AMBULATORY_CARE_PROVIDER_SITE_OTHER): Payer: Medicaid Other | Admitting: Family

## 2019-09-14 ENCOUNTER — Encounter: Payer: Self-pay | Admitting: Family

## 2019-09-14 DIAGNOSIS — J208 Acute bronchitis due to other specified organisms: Secondary | ICD-10-CM | POA: Diagnosis not present

## 2019-09-14 DIAGNOSIS — J441 Chronic obstructive pulmonary disease with (acute) exacerbation: Secondary | ICD-10-CM | POA: Diagnosis not present

## 2019-09-14 DIAGNOSIS — B9689 Other specified bacterial agents as the cause of diseases classified elsewhere: Secondary | ICD-10-CM

## 2019-09-14 MED ORDER — PREDNISONE 20 MG PO TABS: ORAL_TABLET | ORAL | 0 refills | Status: DC

## 2019-09-14 MED ORDER — DOXYCYCLINE HYCLATE 100 MG PO TABS: 100.0000 mg | ORAL_TABLET | Freq: Two times a day (BID) | ORAL | 0 refills | Status: DC

## 2019-09-14 MED ORDER — ALBUTEROL SULFATE (2.5 MG/3ML) 0.083% IN NEBU
2.5000 mg | INHALATION_SOLUTION | Freq: Four times a day (QID) | RESPIRATORY_TRACT | 1 refills | Status: DC | PRN
Start: 2019-09-14 — End: 2020-02-19

## 2019-09-14 NOTE — Progress Notes (Signed)
Virtual Visit via telephone Note Due to COVID-19 pandemic this visit was conducted virtually. This visit type was conducted due to national recommendations for restrictions regarding the COVID-19 Pandemic (e.g. social distancing, sheltering in place) in an effort to limit this patient's exposure and mitigate transmission in our community. All issues noted in this document were discussed and addressed.  A physical exam was not performed with this format.  I connected with [name] on 09/14/19 at 11:25 AM by telephone and verified that I am speaking with the correct person using two identifiers. [name] is currently located at home and no one is currently with her during visit. The provider, [name], FNP is located in their office at time of visit.  I discussed the limitations, risks, security and privacy concerns of performing an evaluation and management service by telephone and the availability of in person appointments. I also discussed with the patient that there may be a patient responsible charge related to this service. The patient expressed understanding and agreed to proceed.   History and Present Illness:  Pt presents to the office today with recurrent cough. She was seen in the Urgent Care on 09/11/19. She was COVID negative and was diagnosed with bronchitis. She was given albuterol, tessalon, and Omnicef. She was also given a steroid injection. She reports her symptoms have worsen.  Cough This is a new problem. The current episode started 1 to 4 weeks ago. The problem has been gradually worsening. The problem occurs every few minutes. The cough is non-productive. Associated symptoms include chills, ear congestion, a fever, headaches, myalgias, nasal congestion, postnasal drip, a sore throat, shortness of breath and wheezing. Pertinent negatives include no ear pain. The symptoms are aggravated by lying down. Risk factors for lung disease include smoking/tobacco  exposure.      Review of Systems  Constitutional: Positive for chills and fever.  HENT: Positive for postnasal drip and sore throat. Negative for ear pain.   Respiratory: Positive for cough, shortness of breath and wheezing.   Musculoskeletal: Positive for myalgias.  Neurological: Positive for headaches.     Observations/Objective: Constant coarse cough, hoarse voice, congestion   Assessment and Plan: [name] comes in today with chief complaint of No chief complaint on file.   Diagnosis and orders addressed:  1. Acute bacterial bronchitis - Take meds as prescribed - Use a cool mist humidifier  -Use saline nose sprays frequently -Force fluids -For any cough or congestion  Use plain Mucinex- regular strength or max strength is fine -For fever or aces or pains- take tylenol or ibuprofen. -Throat lozenges if help -Call if symptoms worsen or do not improve  - doxycycline (VIBRA-TABS) 100 MG tablet; Take 1 tablet (100 mg total) by mouth 2 (two) times daily.  Dispense: 20 tablet; Refill: 0 - predniSONE (DELTASONE) 20 MG tablet; Take 3 tabs daily for 1 week, then 2 tabs for 1 week, then 1 tab for one week  Dispense: 42 tablet; Refill: 0 - For home use only DME Nebulizer machine      I discussed the assessment and treatment plan with the patient. The patient was provided an opportunity to ask questions and all were answered. The patient agreed with the plan and demonstrated an understanding of the instructions.   The patient was advised to call back or seek an in-person evaluation if the symptoms worsen or if the condition fails to improve as anticipated.  The above assessment and management plan was discussed  with the patient. The patient verbalized understanding of and has agreed to the management plan. Patient is aware to call the clinic if symptoms persist or worsen. Patient is aware when to return to the clinic for a follow-up visit. Patient educated on when it is  appropriate to go to the emergency department.   Time call ended:  11:41 AM  I provided 16 minutes of non-face-to-face time during this encounter.    [name], FNP

## 2019-09-17 ENCOUNTER — Telehealth: Payer: Self-pay | Admitting: Family

## 2019-09-17 MED ORDER — NYSTATIN 100000 UNIT/ML MT SUSP
5.0000 mL | Freq: Four times a day (QID) | OROMUCOSAL | 1 refills | Status: DC
Start: 2019-09-17 — End: 2019-10-27

## 2019-09-17 MED ORDER — PROMETHAZINE-DM 6.25-15 MG/5ML PO SYRP
5.0000 mL | ORAL_SOLUTION | Freq: Three times a day (TID) | ORAL | 0 refills | Status: DC | PRN
Start: 2019-09-17 — End: 2019-10-27

## 2019-09-17 NOTE — Telephone Encounter (Signed)
Pt aware, also wants something for the white spots and patches in her mouth and sides of tongue

## 2019-09-17 NOTE — Telephone Encounter (Signed)
Patient aware.

## 2019-09-17 NOTE — Telephone Encounter (Signed)
Nystatin  Prescription sent to pharmacy   

## 2019-09-17 NOTE — Telephone Encounter (Signed)
Pt called stating that she recently had a visit with Christy on 09/14/19 and was prescribed an antibiotic, but pt says she has a terrible cough and would like Christy to send her in a liquid cough medicine with codeine in it to get rid of the cough. Pt also says that her throat feels swollen and has white little bumps in her mouth. Pt uses [facility] in [place].

## 2019-09-17 NOTE — Telephone Encounter (Signed)
Cough syrup Prescription sent to pharmacy

## 2019-09-17 NOTE — Addendum Note (Signed)
Addended by: [name] A on: 09/17/2019 04:46 PM   Modules accepted: Orders

## 2019-09-23 ENCOUNTER — Telehealth: Payer: Self-pay | Admitting: Family

## 2019-09-23 DIAGNOSIS — B9689 Other specified bacterial agents as the cause of diseases classified elsewhere: Secondary | ICD-10-CM

## 2019-09-23 DIAGNOSIS — J208 Acute bronchitis due to other specified organisms: Secondary | ICD-10-CM

## 2019-09-23 MED ORDER — CEFDINIR 300 MG PO CAPS: 300.0000 mg | ORAL_CAPSULE | Freq: Two times a day (BID) | ORAL | 0 refills | Status: DC

## 2019-09-23 NOTE — Telephone Encounter (Signed)
Patient states she is no better from Video visit. States she is now running a fever. Steroids is not letting her sleep. Patient states she is allergic to the doxy that was sent in. She needs something else needs to be sent. [facility] uses [facility] in [place]. Pt is also asking for cream to put inside of her nose to help blisters in nose. PCP off please advise.

## 2019-09-23 NOTE — Telephone Encounter (Signed)
Spoke to patient.  Go back on omnicef.  She has multiple drug allergies.  Likely viral illness but will empirically treat for vestibulitis given reports of soreness at the tip of her nose.

## 2019-09-24 ENCOUNTER — Telehealth: Payer: Self-pay | Admitting: Family

## 2019-09-24 NOTE — Telephone Encounter (Signed)
Who restarted him on the Cefdinir?

## 2019-09-24 NOTE — Telephone Encounter (Signed)
Dr Lajuana Ripple. There is a phone note from yesterday.

## 2019-09-25 NOTE — Telephone Encounter (Signed)
Pt aware of provider feedback and voiced understanding. 

## 2019-09-25 NOTE — Telephone Encounter (Signed)
Continue the Cefdinir at this time. If we continue to switch antibiotics could cause more problems.

## 2019-09-30 DIAGNOSIS — J0100 Acute maxillary sinusitis, unspecified: Secondary | ICD-10-CM | POA: Diagnosis not present

## 2019-09-30 DIAGNOSIS — R05 Cough: Secondary | ICD-10-CM | POA: Diagnosis not present

## 2019-10-05 DIAGNOSIS — T63484A Toxic effect of venom of other arthropod, undetermined, initial encounter: Secondary | ICD-10-CM | POA: Diagnosis not present

## 2019-10-05 DIAGNOSIS — T7840XA Allergy, unspecified, initial encounter: Secondary | ICD-10-CM | POA: Diagnosis not present

## 2019-10-05 DIAGNOSIS — R609 Edema, unspecified: Secondary | ICD-10-CM | POA: Diagnosis not present

## 2019-10-05 DIAGNOSIS — R069 Unspecified abnormalities of breathing: Secondary | ICD-10-CM | POA: Diagnosis not present

## 2019-10-06 DIAGNOSIS — T63441A Toxic effect of venom of bees, accidental (unintentional), initial encounter: Secondary | ICD-10-CM | POA: Diagnosis not present

## 2019-10-06 DIAGNOSIS — L03114 Cellulitis of left upper limb: Secondary | ICD-10-CM | POA: Diagnosis not present

## 2019-10-06 DIAGNOSIS — K644 Residual hemorrhoidal skin tags: Secondary | ICD-10-CM | POA: Diagnosis not present

## 2019-10-19 ENCOUNTER — Ambulatory Visit (INDEPENDENT_AMBULATORY_CARE_PROVIDER_SITE_OTHER): Payer: Medicaid Other | Admitting: Nurse Practitioner

## 2019-10-19 VITALS — Temp 98.1°F

## 2019-10-19 DIAGNOSIS — J029 Acute pharyngitis, unspecified: Secondary | ICD-10-CM | POA: Diagnosis not present

## 2019-10-19 DIAGNOSIS — R059 Cough, unspecified: Secondary | ICD-10-CM | POA: Insufficient documentation

## 2019-10-19 DIAGNOSIS — R05 Cough: Secondary | ICD-10-CM

## 2019-10-19 MED ORDER — BENZONATATE 100 MG PO CAPS: 100.0000 mg | ORAL_CAPSULE | Freq: Two times a day (BID) | ORAL | 0 refills | Status: DC

## 2019-10-19 MED ORDER — SALINE SPRAY 0.65 % NA SOLN: 1.0000 | NASAL | 3 refills | Status: DC | PRN

## 2019-10-19 MED ORDER — AZITHROMYCIN 500 MG PO TABS: 500.0000 mg | ORAL_TABLET | Freq: Every day | ORAL | 0 refills | Status: DC

## 2019-10-19 MED ORDER — NAPROXEN 500 MG PO TABS: 500.0000 mg | ORAL_TABLET | Freq: Two times a day (BID) | ORAL | 0 refills | Status: DC

## 2019-10-19 NOTE — Assessment & Plan Note (Signed)
Patient is reporting new onset cough.  Patient is reporting cough started with sore throat, left neck pain, fatigue, lethargy, body aches headache, nausea vomiting and fever.  Patient has had a fever of 99.2 and 101.7 in the last 2 days.  Patient is reporting 98.1 fever today. Advised patient to come by clinic for Covid test patient refused.  Patient says she believes she is already had Covid and has already had her maternal vaccine in the past which made her really sick and would not want to have anymore Covid testing. Started patient on Zithromax.  Advised patient to Tylenol for headache, increase hydration, Tessalon Perles for cough and saline spray. Rx sent to pharmacy. Follow-up with worsening or unresolved symptoms.

## 2019-10-19 NOTE — Assessment & Plan Note (Signed)
Patient is a 59 year old female who presents for virtual visit for sore throat.  Symptoms started the last 7 days.  Patient is reporting recurrent fevers but she is afebrile today.  Headache, body aches, cough, fatigue, lethargy and loss of taste and smell.  Patient is refusing COVID-19 test because she believes she had a negative test 4 to 6 weeks ago and will not retest today.  Patient also reports she has had a Covid vaccine mother and she is reporting she has been ill ever since she got her vaccine. Provided education to patient..  Advised patient to increase hydration, Tylenol for pain Zithromax sent to pharmacy, Tessalon for cough. Advised patient to follow-up with worsening or unresolved symptoms.

## 2019-10-19 NOTE — Progress Notes (Signed)
Virtual Visit via telephone Note Due to COVID-19 pandemic this visit was conducted virtually. This visit type was conducted due to national recommendations for restrictions regarding the COVID-19 Pandemic (e.g. social distancing, sheltering in place) in an effort to limit this patient's exposure and mitigate transmission in our community. All issues noted in this document were discussed and addressed.  A physical exam was not performed with this format.  I connected with [name] on 10/19/19 at 11:14 am by telephone and verified that I am speaking with the correct person using two identifiers. [name] is currently located at home and no one  is currently with patient during visit. The provider, Ivy Lynn, NP is located in their office at time of visit.  I discussed the limitations, risks, security and privacy concerns of performing an evaluation and management service by telephone and the availability of in person appointments. I also discussed with the patient that there may be a patient responsible charge related to this service. The patient expressed understanding and agreed to proceed.   History and Present Illness:  Sore Throat  This is a new problem. The current episode started in the past 7 days. The problem has been unchanged. The maximum temperature recorded prior to her arrival was 100.4 - 100.9 F. The fever has been present for 1 to 2 days. The pain is at a severity of 5/10. Associated symptoms include congestion, coughing and headaches. Pertinent negatives include no shortness of breath.      Review of Systems  Constitutional: Positive for fever and malaise/fatigue.  HENT: Positive for congestion.        Sore throat  Respiratory: Positive for cough. Negative for shortness of breath.   Neurological: Positive for headaches.  All other systems reviewed and are negative.    Observations/Objective: Virtual televisit  Assessment and Plan: Cough Patient is  reporting new onset cough.  Patient is reporting cough started with sore throat, left neck pain, fatigue, lethargy, body aches headache, nausea vomiting and fever.  Patient has had a fever of 99.2 and 101.7 in the last 2 days.  Patient is reporting 98.1 fever today. Advised patient to come by clinic for Covid test patient refused.  Patient says she believes she is already had Covid and has already had her maternal vaccine in the past which made her really sick and would not want to have anymore Covid testing. Started patient on Zithromax.  Advised patient to Tylenol for headache, increase hydration, Tessalon Perles for cough and saline spray. Rx sent to pharmacy. Follow-up with worsening or unresolved symptoms.  Sore throat Patient is a 59 year old female who presents for virtual visit for sore throat.  Symptoms started the last 7 days.  Patient is reporting recurrent fevers but she is afebrile today.  Headache, body aches, cough, fatigue, lethargy and loss of taste and smell.  Patient is refusing COVID-19 test because she believes she had a negative test 4 to 6 weeks ago and will not retest today.  Patient also reports she has had a Covid vaccine mother and she is reporting she has been ill ever since she got her vaccine. Provided education to patient..  Advised patient to increase hydration, Tylenol for pain Zithromax sent to pharmacy, Tessalon for cough. Advised patient to follow-up with worsening or unresolved symptoms.    Follow Up Instructions: With worsening or unresolved symptoms    I discussed the assessment and treatment plan with the patient. The patient was provided an opportunity  to ask questions and all were answered. The patient agreed with the plan and demonstrated an understanding of the instructions.   The patient was advised to call back or seek an in-person evaluation if the symptoms worsen or if the condition fails to improve as anticipated.  The above assessment and  management plan was discussed with the patient. The patient verbalized understanding of and has agreed to the management plan. Patient is aware to call the clinic if symptoms persist or worsen. Patient is aware when to return to the clinic for a follow-up visit. Patient educated on when it is appropriate to go to the emergency department.   Time call ended:  11:29  I provided 15 minutes of non-face-to-face time during this encounter.    Ivy Lynn, NP

## 2019-10-23 ENCOUNTER — Ambulatory Visit: Payer: Medicaid Other | Admitting: Family

## 2019-10-26 ENCOUNTER — Telehealth: Payer: Self-pay

## 2019-10-26 NOTE — Telephone Encounter (Signed)
Patient states that she believes she has diverticulitis.  Patient states that she has ate a lot of cheese recently, no nuts or seeded foods and is having trouble having a BM.  Patient also states that she does not have an appetite ( She typically only eats a few crackers or 1/2 a sandwich daily) recently prescribed megace but states this medication did not work and would like something stronger.  Patient states that she has taken a laxative for BMs with no relief.

## 2019-10-27 ENCOUNTER — Ambulatory Visit (INDEPENDENT_AMBULATORY_CARE_PROVIDER_SITE_OTHER): Payer: Medicaid Other | Admitting: Family

## 2019-10-27 ENCOUNTER — Encounter: Payer: Self-pay | Admitting: Family

## 2019-10-27 DIAGNOSIS — R1084 Generalized abdominal pain: Secondary | ICD-10-CM | POA: Diagnosis not present

## 2019-10-27 DIAGNOSIS — K5900 Constipation, unspecified: Secondary | ICD-10-CM | POA: Diagnosis not present

## 2019-10-27 DIAGNOSIS — R630 Anorexia: Secondary | ICD-10-CM | POA: Diagnosis not present

## 2019-10-27 MED ORDER — LINACLOTIDE 145 MCG PO CAPS: 145.0000 ug | ORAL_CAPSULE | Freq: Every day | ORAL | 5 refills | Status: DC

## 2019-10-27 MED ORDER — CYPROHEPTADINE HCL 4 MG PO TABS: 4.0000 mg | ORAL_TABLET | Freq: Three times a day (TID) | ORAL | 1 refills | Status: DC | PRN

## 2019-10-27 NOTE — Telephone Encounter (Signed)
An appointment made for today.

## 2019-10-27 NOTE — Progress Notes (Signed)
Virtual Visit via telephone Note Due to COVID-19 pandemic this visit was conducted virtually. This visit type was conducted due to national recommendations for restrictions regarding the COVID-19 Pandemic (e.g. social distancing, sheltering in place) in an effort to limit this patient's exposure and mitigate transmission in our community. All issues noted in this document were discussed and addressed.  A physical exam was not performed with this format.  I connected with [name] on 10/27/19 at 3:32 pm  by telephone and video and verified that I am speaking with the correct person using two identifiers. [name] is currently located at home and no one is currently with her during visit. The provider, [name], FNP is located in their office at time of visit.  I discussed the limitations, risks, security and privacy concerns of performing an evaluation and management service by telephone and the availability of in person appointments. I also discussed with the patient that there may be a patient responsible charge related to this service. The patient expressed understanding and agreed to proceed.   History and Present Illness:  PT calls the office today with abdominal pain that started 5-6 days ago. She bought some stool softeners and had a BM today and feels better.   She is also requesting a medication to help with decrease appetite. She has tried megace without relief.  Abdominal Pain This is a new problem. The current episode started in the past 7 days. The onset quality is gradual. The problem occurs constantly. The problem has been waxing and waning. The pain is located in the LLQ. The pain is at a severity of 3/10. The pain is mild. The quality of the pain is cramping. Associated symptoms include constipation and nausea. Pertinent negatives include no dysuria, flatus, frequency, headaches or hematuria. The pain is relieved by bowel movements. Treatments tried: stool  softener. The treatment provided moderate relief.     Review of Systems  Gastrointestinal: Positive for abdominal pain, constipation and nausea. Negative for flatus.  Genitourinary: Negative for dysuria, frequency and hematuria.  Neurological: Negative for headaches.     Observations/Objective: No SOB or distress noted, pt looks good  Assessment and Plan: [name] comes in today with chief complaint of No chief complaint on file.   Diagnosis and orders addressed:  1. Generalized abdominal pain - linaclotide (LINZESS) 145 MCG CAPS capsule; Take 1 capsule (145 mcg total) by mouth daily before breakfast.  Dispense: 30 capsule; Refill: 5  2. Constipation, unspecified constipation type - linaclotide (LINZESS) 145 MCG CAPS capsule; Take 1 capsule (145 mcg total) by mouth daily before breakfast.  Dispense: 30 capsule; Refill: 5  3. Decreased appetite - cyproheptadine (PERIACTIN) 4 MG tablet; Take 1 tablet (4 mg total) by mouth 3 (three) times daily as needed.  Dispense: 30 tablet; Refill: 1   Will start Linzess today Force fluids Increase fiber Ensure with meals      I discussed the assessment and treatment plan with the patient. The patient was provided an opportunity to ask questions and all were answered. The patient agreed with the plan and demonstrated an understanding of the instructions.   The patient was advised to call back or seek an in-person evaluation if the symptoms worsen or if the condition fails to improve as anticipated.  The above assessment and management plan was discussed with the patient. The patient verbalized understanding of and has agreed to the management plan. Patient is aware to call the clinic if symptoms  persist or worsen. Patient is aware when to return to the clinic for a follow-up visit. Patient educated on when it is appropriate to go to the emergency department.   Time call ended: 3:52 pm    I provided 20 minutes of non and  -face-to-face time during this encounter.    [name], FNP

## 2019-10-28 DIAGNOSIS — R110 Nausea: Secondary | ICD-10-CM | POA: Diagnosis not present

## 2019-10-28 DIAGNOSIS — G43009 Migraine without aura, not intractable, without status migrainosus: Secondary | ICD-10-CM | POA: Diagnosis not present

## 2019-10-28 DIAGNOSIS — K5792 Diverticulitis of intestine, part unspecified, without perforation or abscess without bleeding: Secondary | ICD-10-CM | POA: Diagnosis not present

## 2019-11-11 ENCOUNTER — Encounter (HOSPITAL_COMMUNITY): Payer: Self-pay | Admitting: Emergency Medicine

## 2019-11-11 ENCOUNTER — Emergency Department (HOSPITAL_COMMUNITY)
Admission: EM | Admit: 2019-11-11 | Discharge: 2019-11-11 | Disposition: A | Discharge status: Home / Self Care | Payer: Medicaid Other | Attending: Emergency Medicine | Admitting: Emergency Medicine

## 2019-11-11 ENCOUNTER — Emergency Department (HOSPITAL_COMMUNITY): Payer: Medicaid Other

## 2019-11-11 DIAGNOSIS — J449 Chronic obstructive pulmonary disease, unspecified: Secondary | ICD-10-CM | POA: Diagnosis not present

## 2019-11-11 DIAGNOSIS — F17210 Nicotine dependence, cigarettes, uncomplicated: Secondary | ICD-10-CM | POA: Insufficient documentation

## 2019-11-11 DIAGNOSIS — Z7951 Long term (current) use of inhaled steroids: Secondary | ICD-10-CM | POA: Insufficient documentation

## 2019-11-11 DIAGNOSIS — Z79899 Other long term (current) drug therapy: Secondary | ICD-10-CM | POA: Insufficient documentation

## 2019-11-11 DIAGNOSIS — R0789 Other chest pain: Secondary | ICD-10-CM

## 2019-11-11 DIAGNOSIS — R079 Chest pain, unspecified: Secondary | ICD-10-CM | POA: Diagnosis not present

## 2019-11-11 DIAGNOSIS — Z7982 Long term (current) use of aspirin: Secondary | ICD-10-CM | POA: Insufficient documentation

## 2019-11-11 LAB — BASIC METABOLIC PANEL
Anion gap: 10 (ref 5–15)
BUN: 6 mg/dL (ref 6–20)
CO2: 25 mmol/L (ref 22–32)
Calcium: 9.7 mg/dL (ref 8.9–10.3)
Chloride: 103 mmol/L (ref 98–111)
Creatinine, Ser: 0.81 mg/dL (ref 0.44–1.00)
GFR, Estimated: >60 mL/min (ref >60)
Glucose, Bld: 95 mg/dL (ref 70–99)
Potassium: 3.6 mmol/L (ref 3.5–5.1)
Sodium: 138 mmol/L (ref 135–145)

## 2019-11-11 LAB — CBC
HCT: 42.2 % (ref 36.0–46.0)
Hemoglobin: 13.6 g/dL (ref 12.0–15.0)
MCH: 30.9 pg (ref 26.0–34.0)
MCHC: 32.2 g/dL (ref 30.0–36.0)
MCV: 95.9 fL (ref 80.0–100.0)
Platelets: 307 10*3/uL (ref 150–400)
RBC: 4.4 MIL/uL (ref 3.87–5.11)
RDW: 13.8 % (ref 11.5–15.5)
WBC: 10 10*3/uL (ref 4.0–10.5)
nRBC: 0 % (ref 0.0–0.2)

## 2019-11-11 LAB — TROPONIN I (HIGH SENSITIVITY): Troponin I (High Sensitivity): 4 ng/L (ref <18)

## 2019-11-11 NOTE — Discharge Instructions (Signed)
Seen here for chest pain.  Lab work and imaging all looks reassuring.  I recommend continue with your medication as prescribed.  I recommend eating and drinking if you do not have an appetite I do recommend soups as this will provide you with fluids as well as calories.  Please keep your appointment with your PCP on the 26.  Come back to the emergency department if you develop chest pain, shortness of breath, severe abdominal pain, uncontrolled nausea, vomiting, diarrhea.

## 2019-11-11 NOTE — ED Provider Notes (Signed)
[facility] DEPARTMENT Provider Note   CSN: [id number] Arrival date & time: 11/11/19  1431     History Chief Complaint  Patient presents with  . Chest Pain    [name] is a 59 y.o. female.  HPI   Patient with significant medical history of anxiety, depression, tobacco use presents to the emergency department with chief complaint of chest pain and concerns of weight loss patient states around 9 AM this morning she had some chest tightness which then she felt in her neck and in her teeth.  Described the pain as a ache-like sensation.  She denies associated lightheadedness, dizziness, paresthesias/weakness in the upper or lower extremities, or shortness of breath.  Patient states this feels exactly like her normal panic attack which she gets 3 times a week. she states she has been struggling with anxiety and it has gotten worse since they stopped her lorazepam.  Patient states the pain went away after 45 minutes, she also endorses that she had 1 lorazepam left which she took which also help with it to.  She states since 1030 patient has been without chest pain.  She states she is really here because she was more concerned about her weight loss.  Patient states she lost 30 pounds and would like a pill for appetite.  She states she has no abdominal pain, nausea vomiting diarrhea just that she does not have an appetite.  She states this is happened in the past where they had to place her on a appetite pill.  She has a scheduled doctor's appointment with her PCP on the 26 for her weight loss.  Patient denies night sweats, bone pain, history of cancer.  Patient denies headache, fever, chills, shortness of breath, abdominal pain, nausea, vomiting, diarrhea, pedal edema.  Past Medical History:  Diagnosis Date  . Anxiety   . Arthritis   . Back pain   . Depression   . Hip pain   . HSV-1 infection   . Hyperlipidemia   . Right leg pain   . Sciatica   . Tobacco  user     Patient Active Problem List   Diagnosis Date Noted  . Cough 10/19/2019  . Vitamin D deficiency 07/20/2019  . Benzodiazepine dependence ([place]) 11/29/2017  . Controlled substance agreement signed 11/29/2017  . Aortic atherosclerosis ([place]) 03/28/2017  . COPD (chronic obstructive pulmonary disease) ([place]) 12/10/2016  . Sore throat 10/13/2012  . HSV-1 infection   . Tobacco user   . Anxiety   . Depression   . Hyperlipidemia   . Sciatica   . Back pain   . Right leg pain   . Hip pain   . Postmenopausal atrophic vaginitis 04/15/2012  . Pelvic pain in female 04/15/2012  . HEMORRHAGE OF RECTUM AND ANUS 06/16/2009    Past Surgical History:  Procedure Laterality Date  . BUNIONECTOMY    . CATARACT EXTRACTION    . CESAREAN SECTION    . COLONOSCOPY W/ POLYPECTOMY    . ELBOW SURGERY    . NOVASURE ABLATION    . RHYTIDECTOMY NECK / CHEEK / CHIN    . TONSILLECTOMY    . TUBAL LIGATION       OB History    Gravida  3   Para  2   Term  2   Preterm  0   AB  1   Living  2     SAB  0   TAB  1   Ectopic  0   Multiple  0   Live Births              Family History  Problem Relation Age of Onset  . Brain cancer Mother   . Cancer - Other Father        Abdominal   . Colon cancer Sister   . Colon cancer Paternal Grandmother     Social History   Tobacco Use  . Smoking status: Current Every Day Smoker    Packs/day: 1.00    Types: Cigarettes    Start date: 01/23/1976  . Smokeless tobacco: Never Used  Vaping Use  . Vaping Use: Never used  Substance Use Topics  . Alcohol use: No  . Drug use: No    Home Medications Prior to Admission medications   Medication Sig Start Date End Date Taking? Authorizing Provider  albuterol (PROVENTIL) (2.5 MG/3ML) 0.083% nebulizer solution Take 3 mLs (2.5 mg total) by nebulization every 6 (six) hours as needed for wheezing or shortness of breath. 09/14/19   [name], FNP  ALPRAZolam Duanne Moron) 1 MG tablet Take 1.5 tablets  (1.5 mg total) by mouth daily as needed for anxiety. Patient taking differently: Take 1.5 mg by mouth 2 (two) times daily.  07/20/19   [name] A, FNP  aspirin EC 81 MG tablet Take 81 mg by mouth daily.    [provider]  baclofen (LIORESAL) 10 MG tablet Take 10 mg by mouth 3 (three) times daily.  04/16/18   [provider]  cyproheptadine (PERIACTIN) 4 MG tablet Take 1 tablet (4 mg total) by mouth 3 (three) times daily as needed. 10/27/19   [name], FNP  folic acid (FOLVITE) 1 MG tablet Take 1 tablet (1 mg total) by mouth daily. 09/02/19   Derek Jack, MD  gabapentin (NEURONTIN) 300 MG capsule Take 1 capsule (300 mg total) by mouth 3 (three) times daily. 07/20/19   [name], FNP  HYDROcodone-acetaminophen (NORCO) 10-325 MG tablet Take 1 tablet by mouth every 6 (six) hours as needed for pain.  05/25/19   [provider]  linaclotide Rolan Lipa) 145 MCG CAPS capsule Take 1 capsule (145 mcg total) by mouth daily before breakfast. 10/27/19   [name] A, FNP  naproxen (NAPROSYN) 500 MG tablet Take 1 tablet (500 mg total) by mouth 2 (two) times daily with a meal. 10/19/19   Ivy Lynn, NP  PROVENTIL HFA 108 (90 Base) MCG/ACT inhaler Inhale 2 puffs into the lungs every 6 (six) hours as needed for wheezing. Brand Name Medically Necessary 08/05/19   [name] A, FNP  RETIN-A 0.01 % gel Apply topically at bedtime. 07/21/19   [name], FNP  Vitamin D, Ergocalciferol, (DRISDOL) 1.25 MG (50000 UNIT) CAPS capsule Take 1 capsule (50,000 Units total) by mouth every 7 (seven) days. 09/02/19   Derek Jack, MD    Allergies    Meperidine hcl, Sulfa antibiotics, and Doxycycline  Review of Systems   Review of Systems  Constitutional: Positive for appetite change. Negative for chills and fever.  HENT: Negative for congestion, tinnitus, trouble swallowing and voice change.   Respiratory: Negative for shortness of breath.    Cardiovascular: Negative for chest pain.  Gastrointestinal: Negative for abdominal pain, diarrhea, nausea and vomiting.  Genitourinary: Negative for enuresis.  Musculoskeletal: Negative for back pain.  Skin: Negative for rash.  Neurological: Negative for dizziness and headaches.  Hematological: Does not bruise/bleed easily.    Physical Exam Updated Vital Signs  BP 120/72 (BP Location: Right Arm)   Pulse 60   Temp 98 F (36.7 C) (Oral)   Resp 20   SpO2 96%   Physical Exam Vitals and nursing note reviewed.  Constitutional:      General: She is not in acute distress.    Appearance: She is not ill-appearing.  HENT:     Head: Normocephalic and atraumatic.     Nose: No congestion.     Mouth/Throat:     Mouth: Mucous membranes are moist.     Pharynx: Oropharynx is clear.  Eyes:     General: No scleral icterus. Cardiovascular:     Rate and Rhythm: Normal rate and regular rhythm.     Pulses: Normal pulses.     Heart sounds: No murmur heard.  No friction rub. No gallop.   Pulmonary:     Effort: No respiratory distress.     Breath sounds: No wheezing, rhonchi or rales.  Abdominal:     General: There is no distension.     Palpations: Abdomen is soft.     Tenderness: There is no abdominal tenderness. There is no right CVA tenderness, left CVA tenderness or guarding.  Musculoskeletal:        General: No swelling.     Right lower leg: No edema.     Left lower leg: No edema.  Skin:    General: Skin is warm and dry.     Capillary Refill: Capillary refill takes less than 2 seconds.     Findings: No rash.  Neurological:     Mental Status: She is alert.  Psychiatric:        Mood and Affect: Mood normal.     ED Results / Procedures / Treatments   Labs (all labs ordered are listed, but only abnormal results are displayed) Labs Reviewed  BASIC METABOLIC PANEL  CBC  I-STAT BETA HCG BLOOD, ED ([facility], [facility], [facility] ONLY)  TROPONIN I (HIGH SENSITIVITY)    EKG None  Radiology DG  Chest 2 View  Result Date: 11/11/2019 CLINICAL DATA:  Chest pain EXAM: CHEST - 2 VIEW COMPARISON:  May 2021 FINDINGS: Minimal atelectasis/scarring at the left lung base. No pleural effusion or pneumothorax. Normal heart size. No acute osseous abnormality. Chronic left rib fractures. IMPRESSION: Minimal atelectasis/scarring at the left lung base. Electronically Signed   By: Macy Mis M.D.   On: 11/11/2019 15:32    Procedures Procedures (including critical care time)  Medications Ordered in ED Medications - No data to display  ED Course  I have reviewed the triage vital signs and the nursing notes.  Pertinent labs & imaging results that were available during my care of the patient were reviewed by me and considered in my medical decision making (see chart for details).    MDM Rules/Calculators/A&P                          I have personally reviewed all imaging, labs and have interpreted them.  Patient presents with chest pain and concerns of weight loss.  She is alert, did not appear acute distress, vital signs reassuring.  Will order chest labs work-up and imaging for further evaluation.  CBC negative for leukocytosis or signs of anemia, BMP negative for electrolyte abnormalities, no metabolic acidosis, no AKI or anion gap noted.  Initial troponin 4.  Chest x-ray does not reveal any acute abnormalities.  EKG was sinus rhythm without signs of ischemia no ST elevation  or depression noted.  I have low suspicion for ACS as history is atypical, patient has no cardiac history, EKG was sinus rhythm without signs of ischemia, initial troponin was 4-second troponin was not indicated as last December chest pain present 10:30 AM this morning.  Low suspicion for PE as patient denies pleuritic chest pain, shortness of breath, patient denies leg pain, no pedal edema noted on exam, patient has low risk factors, no DVTs or PEs, is not on hormone therapy.  Low suspicion for AAA or aortic dissection as  history is atypical, patient has low risk factors.  Low suspicion for systemic infection as patient is nontoxic-appearing, vital signs reassuring, no obvious source infection noted on exam.  I suspect patient chest pain is secondary to her anxiety attack as she states she gets these frequently and this feels exactly like her last episode.  I also suspect that her weight loss may be secondary to her anxiety.  Will encourage that she follows up with her PCP on the 26 for further evaluation.  Vital signs have remained stable, no indication for hospital admission.  Patient discussed with attending and they agreed with assessment and plan.  Patient given at home care as well strict return precautions.  Patient verbalized that they understood agreed to said plan.   Final Clinical Impression(s) / ED Diagnoses Final diagnoses:  Atypical chest pain    Rx / DC Orders ED Discharge Orders    None       Marcello Fennel, PA-C 11/11/19 1731    Charlesetta Shanks, MD 11/11/19 2157

## 2019-11-11 NOTE — ED Triage Notes (Addendum)
Pt states since receiving moderna vaccine in march, she has been having increased anxiety and chest pain radiating to her jaw. Also endorses decreased appetite and weight loss. Denies cough or sob. States her doctor is trying to wean her off xanax.

## 2019-11-11 NOTE — ED Notes (Signed)
Patient verbalizes understanding of discharge instructions. Opportunity for questioning and answers were provided. Armband removed by staff, pt discharged from ED to home via POV  

## 2019-11-17 ENCOUNTER — Ambulatory Visit (INDEPENDENT_AMBULATORY_CARE_PROVIDER_SITE_OTHER): Payer: Medicaid Other | Admitting: Family

## 2019-11-17 ENCOUNTER — Encounter: Payer: Self-pay | Admitting: Family

## 2019-11-17 DIAGNOSIS — R630 Anorexia: Secondary | ICD-10-CM

## 2019-11-17 DIAGNOSIS — I700 Atherosclerosis of aorta: Secondary | ICD-10-CM

## 2019-11-17 DIAGNOSIS — F321 Major depressive disorder, single episode, moderate: Secondary | ICD-10-CM

## 2019-11-17 DIAGNOSIS — J410 Simple chronic bronchitis: Secondary | ICD-10-CM | POA: Diagnosis not present

## 2019-11-17 DIAGNOSIS — Z79899 Other long term (current) drug therapy: Secondary | ICD-10-CM

## 2019-11-17 DIAGNOSIS — R634 Abnormal weight loss: Secondary | ICD-10-CM

## 2019-11-17 DIAGNOSIS — F419 Anxiety disorder, unspecified: Secondary | ICD-10-CM | POA: Diagnosis not present

## 2019-11-17 DIAGNOSIS — Z720 Tobacco use: Secondary | ICD-10-CM

## 2019-11-17 DIAGNOSIS — E785 Hyperlipidemia, unspecified: Secondary | ICD-10-CM

## 2019-11-17 DIAGNOSIS — M5442 Lumbago with sciatica, left side: Secondary | ICD-10-CM

## 2019-11-17 DIAGNOSIS — F1320 Sedative, hypnotic or anxiolytic dependence, uncomplicated: Secondary | ICD-10-CM

## 2019-11-17 DIAGNOSIS — G8929 Other chronic pain: Secondary | ICD-10-CM

## 2019-11-17 DIAGNOSIS — M5441 Lumbago with sciatica, right side: Secondary | ICD-10-CM

## 2019-11-17 MED ORDER — ALPRAZOLAM 1 MG PO TABS: 1.5000 mg | ORAL_TABLET | Freq: Every day | ORAL | 2 refills | Status: DC | PRN

## 2019-11-17 MED ORDER — MEGESTROL ACETATE 400 MG/10ML PO SUSP: 800.0000 mg | Freq: Every day | ORAL | 2 refills | Status: DC

## 2019-11-17 MED ORDER — ATORVASTATIN CALCIUM 20 MG PO TABS: 20.0000 mg | ORAL_TABLET | Freq: Every day | ORAL | 3 refills | Status: DC

## 2019-11-17 NOTE — Progress Notes (Signed)
Virtual Visit via telephone Note Due to COVID-19 pandemic this visit was conducted virtually. This visit type was conducted due to national recommendations for restrictions regarding the COVID-19 Pandemic (e.g. social distancing, sheltering in place) in an effort to limit this patient's exposure and mitigate transmission in our community. All issues noted in this document were discussed and addressed.  A physical exam was not performed with this format.  I connected with [name] on 11/17/19 at 12:01 pm  by telephone and verified that I am speaking with the correct person using two identifiers. [name] is currently located at home and no one is currently with her during visit. The provider, [name], FNP is located in their office at time of visit.  I discussed the limitations, risks, security and privacy concerns of performing an evaluation and management service by telephone and the availability of in person appointments. I also discussed with the patient that there may be a patient responsible charge related to this service. The patient expressed understanding and agreed to proceed.   History and Present Illness:  Pt presents to the office today chronic follow up. She continues to have increased anxiety. She has tried Lexapro, Cymbalta but can not tolerate it. She is followed by [facility] 3 months for chronic back pain.  She reports she weighs 128 lbs and states she has lost 30 lbs over the last 3 months. We ordered periactin on her last visit, but states she never started this.   She is a 1/2 pack smoker for the last 30 years.She reports she has intermittent SOB. Anxiety Presents for follow-up visit. Symptoms include depressed mood, excessive worry, irritability, nervous/anxious behavior, panic and restlessness. Symptoms occur occasionally. The severity of symptoms is moderate.    Nicotine Dependence Presents for follow-up visit. Symptoms include irritability. Her  urge triggers include company of smokers. The symptoms have been stable. She smokes < 1/2 a pack of cigarettes per day. Compliance with prior treatments has been good.  Back Pain This is a chronic problem. The current episode started more than 1 year ago. The problem occurs intermittently. The problem has been waxing and waning since onset. The pain is present in the lumbar spine. The quality of the pain is described as aching. The pain is at a severity of 5/10. The pain is moderate. She has tried bed rest and analgesics for the symptoms. The treatment provided mild relief.  Hyperlipidemia This is a chronic problem. The current episode started more than 1 year ago. Current antihyperlipidemic treatment includes statins. The current treatment provides mild improvement of lipids. Risk factors for coronary artery disease include dyslipidemia, hypertension and a sedentary lifestyle.   COPD Using albuterol as needed. Continues to smoke 1/2 pack a day. SOB at times.   Review of Systems  Constitutional: Positive for irritability.  Musculoskeletal: Positive for back pain.  Psychiatric/Behavioral: The patient is nervous/anxious.   All other systems reviewed and are negative.    Observations/Objective: No SOB or distress, anxious  Assessment and Plan: [name] comes in today with chief complaint of No chief complaint on file.   Diagnosis and orders addressed:  1. Aortic atherosclerosis ([place]) Will start Lipitor today - atorvastatin (LIPITOR) 20 MG tablet; Take 1 tablet (20 mg total) by mouth daily.  Dispense: 90 tablet; Refill: 3  2. Simple chronic bronchitis ([place])  3. Anxiety Will continue xanax at 1.5 mg daily, goal to continue to tamper off while taking Norco - ALPRAZolam Duanne Moron) 1  MG tablet; Take 1.5 tablets (1.5 mg total) by mouth daily as needed for anxiety.  Dispense: 45 tablet; Refill: 2  4. Chronic bilateral low back pain with bilateral sciatica  5. Current moderate episode  of major depressive disorder, unspecified whether recurrent ([place])  6. Hyperlipidemia, unspecified hyperlipidemia type  7. Tobacco user Smoking cessation discussed  8. Decreased appetite Pt did not want to try periactin, we will retry megace Encourage ensure TID with meals - megestrol (MEGACE) 400 MG/10ML suspension; Take 20 mLs (800 mg total) by mouth daily.  Dispense: 480 mL; Refill: 2  9. Abnormal weight loss - megestrol (MEGACE) 400 MG/10ML suspension; Take 20 mLs (800 mg total) by mouth daily.  Dispense: 480 mL; Refill: 2  10. Benzodiazepine dependence ([facility]) - ALPRAZolam (XANAX) 1 MG tablet; Take 1.5 tablets (1.5 mg total) by mouth daily as needed for anxiety.  Dispense: 45 tablet; Refill: 2  11. Controlled substance agreement signed - ALPRAZolam (XANAX) 1 MG tablet; Take 1.5 tablets (1.5 mg total) by mouth daily as needed for anxiety.  Dispense: 45 tablet; Refill: 2   Labs reviewed from 11/11/19 Continue current meds- low fat diet and exercise and recheck in 3 months Health Maintenance reviewed Diet and exercise encouraged  Follow up plan: 3 months    I discussed the assessment and treatment plan with the patient. The patient was provided an opportunity to ask questions and all were answered. The patient agreed with the plan and demonstrated an understanding of the instructions.   The patient was advised to call back or seek an in-person evaluation if the symptoms worsen or if the condition fails to improve as anticipated.  The above assessment and management plan was discussed with the patient. The patient verbalized understanding of and has agreed to the management plan. Patient is aware to call the clinic if symptoms persist or worsen. Patient is aware when to return to the clinic for a follow-up visit. Patient educated on when it is appropriate to go to the emergency department.   Time call ended: 12:25 pm    I provided 24 minutes of non-face-to-face time during this  encounter.    [name], FNP

## 2019-12-08 DIAGNOSIS — M5416 Radiculopathy, lumbar region: Secondary | ICD-10-CM | POA: Diagnosis not present

## 2019-12-08 DIAGNOSIS — M47816 Spondylosis without myelopathy or radiculopathy, lumbar region: Secondary | ICD-10-CM | POA: Diagnosis not present

## 2019-12-23 DIAGNOSIS — J0100 Acute maxillary sinusitis, unspecified: Secondary | ICD-10-CM | POA: Diagnosis not present

## 2019-12-23 DIAGNOSIS — R059 Cough, unspecified: Secondary | ICD-10-CM | POA: Diagnosis not present

## 2019-12-28 ENCOUNTER — Other Ambulatory Visit (HOSPITAL_COMMUNITY): Payer: Medicaid Other

## 2020-01-04 ENCOUNTER — Ambulatory Visit (HOSPITAL_COMMUNITY): Payer: Medicaid Other | Admitting: Hematology

## 2020-01-12 ENCOUNTER — Inpatient Hospital Stay (HOSPITAL_COMMUNITY): Payer: Medicaid Other | Attending: Hematology and Oncology

## 2020-01-12 ENCOUNTER — Other Ambulatory Visit: Payer: Self-pay

## 2020-01-12 DIAGNOSIS — F17210 Nicotine dependence, cigarettes, uncomplicated: Secondary | ICD-10-CM | POA: Diagnosis not present

## 2020-01-12 DIAGNOSIS — D72829 Elevated white blood cell count, unspecified: Secondary | ICD-10-CM | POA: Insufficient documentation

## 2020-01-12 DIAGNOSIS — F419 Anxiety disorder, unspecified: Secondary | ICD-10-CM | POA: Insufficient documentation

## 2020-01-12 DIAGNOSIS — Z79899 Other long term (current) drug therapy: Secondary | ICD-10-CM | POA: Diagnosis not present

## 2020-01-12 DIAGNOSIS — F329 Major depressive disorder, single episode, unspecified: Secondary | ICD-10-CM | POA: Diagnosis not present

## 2020-01-12 LAB — CBC WITH DIFFERENTIAL/PLATELET
Abs Immature Granulocytes: 0.12 10*3/uL — ABNORMAL HIGH (ref 0.00–0.07)
Basophils Absolute: 0.1 10*3/uL (ref 0.0–0.1)
Basophils Relative: 0 %
Eosinophils Absolute: 0.1 10*3/uL (ref 0.0–0.5)
Eosinophils Relative: 0 %
HCT: 43.2 % (ref 36.0–46.0)
Hemoglobin: 14.6 g/dL (ref 12.0–15.0)
Immature Granulocytes: 1 %
Lymphocytes Relative: 32 %
Lymphs Abs: 4.5 10*3/uL — ABNORMAL HIGH (ref 0.7–4.0)
MCH: 33 pg (ref 26.0–34.0)
MCHC: 33.8 g/dL (ref 30.0–36.0)
MCV: 97.5 fL (ref 80.0–100.0)
Monocytes Absolute: 0.9 10*3/uL (ref 0.1–1.0)
Monocytes Relative: 7 %
Neutro Abs: 8.5 10*3/uL — ABNORMAL HIGH (ref 1.7–7.7)
Neutrophils Relative %: 60 %
Platelets: 361 10*3/uL (ref 150–400)
RBC: 4.43 MIL/uL (ref 3.87–5.11)
RDW: 14.8 % (ref 11.5–15.5)
WBC: 14.2 10*3/uL — ABNORMAL HIGH (ref 4.0–10.5)
nRBC: 0 % (ref 0.0–0.2)

## 2020-01-12 LAB — VITAMIN B12: Vitamin B-12: 120 pg/mL — ABNORMAL LOW (ref 180–914)

## 2020-01-12 LAB — FOLATE: Folate: 18.4 ng/mL (ref >5.9)

## 2020-01-12 LAB — VITAMIN D 25 HYDROXY (VIT D DEFICIENCY, FRACTURES): Vit D, 25-Hydroxy: 68.52 ng/mL (ref 30–100)

## 2020-01-14 ENCOUNTER — Other Ambulatory Visit (HOSPITAL_COMMUNITY): Payer: Self-pay

## 2020-01-14 MED ORDER — BOOST PLUS PO LIQD: 237.0000 mL | Freq: Three times a day (TID) | ORAL | 3 refills | Status: DC

## 2020-01-14 NOTE — Telephone Encounter (Signed)
Communication from dietician, Desmond Lope, received that patient is having difficulty paying for Boost Plus. Order for Boost Plus placed and faxed to [facility] at [phone] along with most recent office note and patient demographics for insurance approval.

## 2020-01-17 ENCOUNTER — Other Ambulatory Visit: Payer: Self-pay

## 2020-01-17 ENCOUNTER — Emergency Department (HOSPITAL_COMMUNITY): Payer: Medicaid Other

## 2020-01-17 ENCOUNTER — Emergency Department (HOSPITAL_COMMUNITY)
Admission: EM | Admit: 2020-01-17 | Discharge: 2020-01-17 | Disposition: A | Discharge status: Home / Self Care | Payer: Medicaid Other | Attending: Emergency Medicine | Admitting: Emergency Medicine

## 2020-01-17 ENCOUNTER — Encounter (HOSPITAL_COMMUNITY): Payer: Self-pay

## 2020-01-17 DIAGNOSIS — F17210 Nicotine dependence, cigarettes, uncomplicated: Secondary | ICD-10-CM | POA: Insufficient documentation

## 2020-01-17 DIAGNOSIS — Z7982 Long term (current) use of aspirin: Secondary | ICD-10-CM | POA: Diagnosis not present

## 2020-01-17 DIAGNOSIS — J449 Chronic obstructive pulmonary disease, unspecified: Secondary | ICD-10-CM | POA: Diagnosis not present

## 2020-01-17 DIAGNOSIS — K5900 Constipation, unspecified: Secondary | ICD-10-CM | POA: Diagnosis not present

## 2020-01-17 DIAGNOSIS — R42 Dizziness and giddiness: Secondary | ICD-10-CM | POA: Diagnosis not present

## 2020-01-17 DIAGNOSIS — R109 Unspecified abdominal pain: Secondary | ICD-10-CM | POA: Diagnosis not present

## 2020-01-17 LAB — COMPREHENSIVE METABOLIC PANEL
ALT: 19 U/L (ref 0–44)
AST: 15 U/L (ref 15–41)
Albumin: 3.7 g/dL (ref 3.5–5.0)
Alkaline Phosphatase: 48 U/L (ref 38–126)
Anion gap: 6 (ref 5–15)
BUN: 14 mg/dL (ref 6–20)
CO2: 24 mmol/L (ref 22–32)
Calcium: 8.9 mg/dL (ref 8.9–10.3)
Chloride: 104 mmol/L (ref 98–111)
Creatinine, Ser: 0.97 mg/dL (ref 0.44–1.00)
GFR, Estimated: >60 mL/min (ref >60)
Glucose, Bld: 87 mg/dL (ref 70–99)
Potassium: 4.1 mmol/L (ref 3.5–5.1)
Sodium: 134 mmol/L — ABNORMAL LOW (ref 135–145)
Total Bilirubin: 0.7 mg/dL (ref 0.3–1.2)
Total Protein: 6.5 g/dL (ref 6.5–8.1)

## 2020-01-17 LAB — CBC
HCT: 44.4 % (ref 36.0–46.0)
Hemoglobin: 14.5 g/dL (ref 12.0–15.0)
MCH: 31.7 pg (ref 26.0–34.0)
MCHC: 32.7 g/dL (ref 30.0–36.0)
MCV: 97.2 fL (ref 80.0–100.0)
Platelets: 327 10*3/uL (ref 150–400)
RBC: 4.57 MIL/uL (ref 3.87–5.11)
RDW: 15.2 % (ref 11.5–15.5)
WBC: 14.9 10*3/uL — ABNORMAL HIGH (ref 4.0–10.5)
nRBC: 0 % (ref 0.0–0.2)

## 2020-01-17 LAB — LIPASE, BLOOD: Lipase: 38 U/L (ref 11–51)

## 2020-01-17 MED ORDER — POLYETHYLENE GLYCOL 3350 17 G PO PACK: 17.0000 g | PACK | Freq: Every day | ORAL | 0 refills | Status: AC

## 2020-01-17 NOTE — ED Provider Notes (Signed)
[facility] EMERGENCY DEPARTMENT Provider Note   CSN: [id number] Arrival date & time: 01/17/20  1055     History Chief Complaint  Patient presents with  . Constipation    [name] is a 59 y.o. female.  HPI 59 year old female with a history of back pain, anxiety, HLD, who presents to ED with complaint of constipation.  Patient states she has had chronic problems with constipation for many years, but over the last 4 days she has been having abdominal spasms and pain with significant difficulty having a bowel movement.  It has been 2-3 weeks she estimates since her last full bowel movement; she has passed some small pieces of stool, the last time 2 or 3 days ago, with some occasional liquid stool noted when she wipes with toilet paper.  She says she has not passed flatus since yesterday.  Yesterday morning she took 60 mg milk of magnesia as well as 2-4 stool softeners and some prune juice, but she has not yet been able to have a bowel movement.  She has severe pain around the rectum and has a sensation of "swelling" and that there is a hard stool ball in the rectum.  She has pain and rectal/pelvic pressure when she sits on the commode and has associated lightheadedness when the pain flares.  She is very concerned that she may have obstruction and requests an x-ray. Patient also takes 10mg  hydrocodone 4 times daily for chronic back pain; she is adamant that her constipation issues are not related to the hydrocodone as she has multiple family members with similar issues require regular "cleanouts" to have regular bowel movements.  Pt notes that her sister has a history of colon cancer and near the time of her diagnosis the whole family was screened. Patient states her last colonoscopy was approximately 10 years ago and she was noted to have many polyps.  Unfortunately, the patient has been without insurance until the last few months and so has not had regular PCP care to  undergo regular colon or cervical cancer screenings.      Past Medical History:  Diagnosis Date  . Anxiety   . Arthritis   . Back pain   . Depression   . Hip pain   . HSV-1 infection   . Hyperlipidemia   . Right leg pain   . Sciatica   . Tobacco user     Patient Active Problem List   Diagnosis Date Noted  . Cough 10/19/2019  . Vitamin D deficiency 07/20/2019  . Benzodiazepine dependence ([place]) 11/29/2017  . Controlled substance agreement signed 11/29/2017  . Aortic atherosclerosis ([place]) 03/28/2017  . COPD (chronic obstructive pulmonary disease) ([place]) 12/10/2016  . Sore throat 10/13/2012  . HSV-1 infection   . Tobacco user   . Anxiety   . Depression   . Hyperlipidemia   . Sciatica   . Back pain   . Right leg pain   . Hip pain   . Postmenopausal atrophic vaginitis 04/15/2012  . Pelvic pain in female 04/15/2012  . HEMORRHAGE OF RECTUM AND ANUS 06/16/2009    Past Surgical History:  Procedure Laterality Date  . BUNIONECTOMY    . CATARACT EXTRACTION    . CESAREAN SECTION    . COLONOSCOPY W/ POLYPECTOMY    . ELBOW SURGERY    . NOVASURE ABLATION    . RHYTIDECTOMY NECK / CHEEK / CHIN    . TONSILLECTOMY    . TUBAL LIGATION  OB History    Gravida  3   Para  2   Term  2   Preterm  0   AB  1   Living  2     SAB  0   IAB  1   Ectopic  0   Multiple  0   Live Births              Family History  Problem Relation Age of Onset  . Brain cancer Mother   . Cancer - Other Father        Abdominal   . Colon cancer Sister   . Colon cancer Paternal Grandmother     Social History   Tobacco Use  . Smoking status: Current Every Day Smoker    Packs/day: 1.00    Types: Cigarettes    Start date: 01/23/1976  . Smokeless tobacco: Never Used  Vaping Use  . Vaping Use: Never used  Substance Use Topics  . Alcohol use: No  . Drug use: No    Home Medications Prior to Admission medications   Medication Sig Start Date End Date Taking? Authorizing  Provider  albuterol (PROVENTIL) (2.5 MG/3ML) 0.083% nebulizer solution Take 3 mLs (2.5 mg total) by nebulization every 6 (six) hours as needed for wheezing or shortness of breath. 09/14/19   Junie SpencerHawks, Christy A, FNP  ALPRAZolam Prudy Feeler(XANAX) 1 MG tablet Take 1.5 tablets (1.5 mg total) by mouth daily as needed for anxiety. 11/17/19   Jannifer RodneyHawks, Christy A, FNP  aspirin EC 81 MG tablet Take 81 mg by mouth daily.    [provider]  atorvastatin (LIPITOR) 20 MG tablet Take 1 tablet (20 mg total) by mouth daily. 11/17/19   Junie SpencerHawks, Christy A, FNP  AYR 0.65 % nasal spray SMARTSIG:Both Nares 11/15/19   [provider]  brompheniramine-pseudoephedrine-DM 30-2-10 MG/5ML syrup Take by mouth. 12/23/19   [provider]  butalbital-acetaminophen-caffeine (FIORICET) 50-325-40 MG tablet SMARTSIG:1 By Mouth 4-5 Times Daily 10/28/19   [provider]  folic acid (FOLVITE) 1 MG tablet Take 1 tablet (1 mg total) by mouth daily. 09/02/19   Doreatha MassedKatragadda, Sreedhar, MD  gabapentin (NEURONTIN) 300 MG capsule Take 1 capsule (300 mg total) by mouth 3 (three) times daily. 07/20/19   Junie SpencerHawks, Christy A, FNP  HYDROcodone-acetaminophen (NORCO) 10-325 MG tablet Take 1 tablet by mouth every 6 (six) hours as needed for pain.  05/25/19   [provider]  lactose free nutrition (BOOST PLUS) LIQD Take 237 mLs by mouth 3 (three) times daily with meals. Chocolate Boost Plus 01/14/20   Doreatha MassedKatragadda, Sreedhar, MD  megestrol (MEGACE) 400 MG/10ML suspension Take 20 mLs (800 mg total) by mouth daily. 11/17/19   Junie SpencerHawks, Christy A, FNP  methocarbamol (ROBAXIN) 500 MG tablet Take 500 mg by mouth 3 (three) times daily. 01/10/20   [provider]  predniSONE (DELTASONE) 5 MG tablet Take by mouth. 12/23/19   [provider]  PROVENTIL HFA 108 (90 Base) MCG/ACT inhaler Inhale 2 puffs into the lungs every 6 (six) hours as needed for wheezing. Brand Name Medically Necessary 08/05/19   Jannifer RodneyHawks, Christy A, FNP  RESTASIS  0.05 % ophthalmic emulsion SMARTSIG:In Eye(s) 10/03/19   [provider]  RETIN-A 0.01 % gel Apply topically at bedtime. 07/21/19   Hawks, Christy A, FNP  RETIN-A MICRO 0.1 % gel Apply topically. 12/26/19   [provider]  tiZANidine (ZANAFLEX) 4 MG tablet Take 4 mg by mouth every 8 (eight) hours as needed. 09/07/19  [provider]  Vitamin D, Ergocalciferol, (DRISDOL) 1.25 MG (50000 UNIT) CAPS capsule Take 1 capsule (50,000 Units total) by mouth every 7 (seven) days. 09/02/19   Derek Jack, MD    Allergies    Meperidine hcl, Sulfa antibiotics, and Doxycycline  Review of Systems   Review of Systems  Constitutional: Negative for chills and fever.  HENT: Negative for ear pain, rhinorrhea and sore throat.   Eyes: Negative for pain and visual disturbance.  Respiratory: Negative for cough, shortness of breath and wheezing.   Cardiovascular: Negative for chest pain and leg swelling.  Gastrointestinal: Positive for abdominal pain, blood in stool (Small amount of blood on the toilet paper within the last several days; patient feels she may have a small anal fissure), constipation and nausea.  Genitourinary: Negative for dysuria and hematuria.  Musculoskeletal: Positive for back pain. Negative for gait problem and joint swelling.  Skin: Negative for color change and rash.  Neurological: Positive for light-headedness. Negative for syncope and headaches.  Psychiatric/Behavioral: Negative for confusion and suicidal ideas.  All other systems reviewed and are negative.   Physical Exam Updated Vital Signs BP 136/76   Pulse 70   Temp 98.4 F (36.9 C) (Oral)   Resp 15   SpO2 95%   Physical Exam Vitals and nursing note reviewed.  Constitutional:      General: She is not in acute distress.    Appearance: She is well-developed and well-nourished. She is not ill-appearing or toxic-appearing.  HENT:     Head: Normocephalic and atraumatic.     Nose: Nose normal.      Mouth/Throat:     Mouth: Mucous membranes are moist.     Pharynx: Oropharynx is clear.  Eyes:     General: No scleral icterus.    Conjunctiva/sclera: Conjunctivae normal.     Pupils: Pupils are equal, round, and reactive to light.  Cardiovascular:     Rate and Rhythm: Normal rate and regular rhythm.     Pulses: Normal pulses.     Heart sounds: No murmur heard. No friction rub. No gallop.   Pulmonary:     Effort: Pulmonary effort is normal. No respiratory distress.     Breath sounds: Normal breath sounds. No rhonchi or rales.  Abdominal:     General: There is distension.     Palpations: Abdomen is soft.     Tenderness: There is abdominal tenderness (Diffuse).  Genitourinary:    Comments: Chaperoned by Fayrene Fearing, RN. Hard ball of stool palpable high in rectal vault; attempted to break up the stool manually. Hemorrhoidal tags and one genital wart noted at 11 o'clock position. No obvious anal fissure noted but tenderness with palpation around the anus. Musculoskeletal:        General: No edema.     Cervical back: Neck supple.     Right lower leg: No edema.     Left lower leg: No edema.  Skin:    General: Skin is warm and dry.  Neurological:     Mental Status: She is alert.     Comments: Alert, grossly oriented, moves all extremities spontaneously  Psychiatric:        Mood and Affect: Mood and affect normal.        Behavior: Behavior normal.     ED Results / Procedures / Treatments   Labs (all labs ordered are listed, but only abnormal results are displayed) Labs Reviewed  COMPREHENSIVE METABOLIC PANEL - Abnormal; Notable for the following components:  Result Value   Sodium 134 (*)    All other components within normal limits  CBC - Abnormal; Notable for the following components:   WBC 14.9 (*)    All other components within normal limits  LIPASE, BLOOD    Procedures Procedures (including critical care time)  Medications Ordered in ED Medications - No data  to display  ED Course  I have reviewed the triage vital signs and the nursing notes.  Pertinent labs & imaging results that were available during my care of the patient were reviewed by me and considered in my medical decision making (see chart for details).    MDM Rules/Calculators/A&P                          59 year old female with constipation and associated abdominal pain/spasms.  Patient is hemodynamically stable and overall well-appearing on initial evaluation.  Patient has tried a few home treatments but endorses an overall poor diet with limited fluid intake other than caffeinated soda.  She has not tried MiraLAX for several years for her constipation.  She endorses that this is a chronic problem.  Patient requesting x-ray imaging to assess for obstruction; KUB and basic labs ordered.  Soapsuds enema ordered upon arrival.  I am most concerned for exacerbation of chronic constipation with multiple risk factors including chronic opioid use and poor hydration with low fiber intake.  Differential includes rectal or colonic mass.  Discussed with patient at length that it will be important for her to follow-up with her PCP to undergo repeat colonoscopy especially in context of her family history and prior results.  Labs reviewed and notable for leukocytosis to 14.9, which is similar compared to her prior results over the last several years.  Patient is not having any symptoms of active urinary, pulmonary, respiratory, or skin infections.  Imaging interpreted by radiology and images personally reviewed.  KUB shows large amount of stool throughout the colon without evidence of obstruction, bowel loop dilation, or perforated viscus.  Patient states that if possible, she would prefer to take the enema home and perform it at home.  I feel that this is reasonable.  At this time, pt appears safe for discharge with strict return precautions provided and instructions for follow up given. MiraLax  prescription provided. Pt voiced understanding and is amenable to this plan. Pt discharged in stable condition.  Final Clinical Impression(s) / ED Diagnoses Final diagnoses:  Constipation, unspecified constipation type    Rx / DC Orders ED Discharge Orders         Ordered    polyethylene glycol (MIRALAX / GLYCOLAX) 17 g packet  Daily        01/17/20 1757           Vanna Scotland, MD [id number] [phone]    Blanchie Dessert, MD 01/18/20 2159

## 2020-01-17 NOTE — Discharge Instructions (Signed)
Thank you for choosing [facility] for your care today.  To do: It is important to increase your water intake and add on more fiber-containing fruits and vegetables.  This should help with your constipation.  I have also included a prescription for MiraLAX, which you can pick up at the pharmacy or obtain over-the-counter.  You can do 1 capful (17 g) daily, or you can do 2 capfuls daily if 1 is not enough, or space out to 1 capful every other day if your stools become too loose.  We will send you home with a soapsuds enema.  You can also try a suppository or enema at home if you are unable to get relief.  Please come back to the ER if you have worsening of your pain, new or worsening rectal bleeding, if you start to have nausea/vomiting or fevers with your constipation, or if you have any other symptoms that worry you. You should discuss having an updated colonoscopy with your primary care doctor.  It is important to get updated screening given your family history of colon cancer and that you had multiple polyps on your last colonoscopy. Please follow up with your primary care doctor in the next few days. If you do not have a PCP you are established with, you can call [phone]  or [phone]  to access [facility] service. You can also visit [email or web address] Please come back to the Emergency Department if you have shortness of breath, chest pain, confusion/mental status changes, if you have so much nausea/vomiting that you cannot keep down fluids, or if you have any other symptoms that worry you.  Take care. Hope you start feeling better soon.  Vanna Scotland, MD Emergency Medicine

## 2020-01-17 NOTE — ED Notes (Signed)
Pt refused last set of vitals, stating she needed to leave before it got dark, because she can't drive in the dark.

## 2020-01-17 NOTE — ED Triage Notes (Signed)
Patient complains of 4 days of constipation. Has used otc meds with no relief. Alert and oriented

## 2020-01-17 NOTE — ED Notes (Signed)
Patient transported to X-ray 

## 2020-01-20 ENCOUNTER — Other Ambulatory Visit: Payer: Self-pay

## 2020-01-20 ENCOUNTER — Inpatient Hospital Stay (HOSPITAL_BASED_OUTPATIENT_CLINIC_OR_DEPARTMENT_OTHER): Payer: Medicaid Other | Admitting: Hematology

## 2020-01-20 VITALS — BP 116/75 | HR 59 | Temp 99.0°F | Resp 17 | Wt 143.4 lb

## 2020-01-20 DIAGNOSIS — D72829 Elevated white blood cell count, unspecified: Secondary | ICD-10-CM

## 2020-01-20 MED ORDER — VITAMIN D 25 MCG (1000 UNIT) PO TABS: 2000.0000 [IU] | ORAL_TABLET | Freq: Every day | ORAL | 6 refills | Status: DC

## 2020-01-20 NOTE — Progress Notes (Signed)
[place], [place] [address]   CLINIC:  Medical Oncology/Hematology  PCP:  [name], [address] / [place] [address]  [phone]  REASON FOR VISIT:  Follow-up for leukocytosis  PRIOR THERAPY: None  CURRENT THERAPY: Observation  INTERVAL HISTORY:  [name], a 59 y.o. female, returns for routine follow-up for her leukocytosis. [name] was last seen on 09/02/2019.  Today she reports feeling okay. She reports that she felt sick after receiving her second COVID vaccine in April and had decreased appetite and fatigue. Her weight has been improving slowly since then. She is drinking 1-2 cans of Ensure. She continues taking folic acid and vitamin D weekly. She is not currently taking steroids.  She continues smoking 1/2 PPD.   REVIEW OF SYSTEMS:  Review of Systems  Constitutional: Positive for appetite change (25%).  Cardiovascular: Positive for chest pain (occasional).  Gastrointestinal: Positive for constipation and nausea.  Psychiatric/Behavioral: The patient is nervous/anxious.   All other systems reviewed and are negative.   PAST MEDICAL/SURGICAL HISTORY:  Past Medical History:  Diagnosis Date  . Anxiety   . Arthritis   . Back pain   . Depression   . Hip pain   . HSV-1 infection   . Hyperlipidemia   . Right leg pain   . Sciatica   . Tobacco user    Past Surgical History:  Procedure Laterality Date  . BUNIONECTOMY    . CATARACT EXTRACTION    . CESAREAN SECTION    . COLONOSCOPY W/ POLYPECTOMY    . ELBOW SURGERY    . NOVASURE ABLATION    . RHYTIDECTOMY NECK / CHEEK / CHIN    . TONSILLECTOMY    . TUBAL LIGATION      SOCIAL HISTORY:  Social History   Socioeconomic History  . Marital status: Divorced    Spouse name: Not on file  . Number of children: 2  . Years of education: Not on file  . Highest education level: Not on file  Occupational History  . Occupation: disabled  Tobacco  Use  . Smoking status: Current Every Day Smoker    Packs/day: 1.00    Types: Cigarettes    Start date: 01/23/1976  . Smokeless tobacco: Never Used  Vaping Use  . Vaping Use: Never used  Substance and Sexual Activity  . Alcohol use: No  . Drug use: No  . Sexual activity: Yes  Other Topics Concern  . 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    Social Determinants of Health   Financial Resource Strain: Not on file  Food Insecurity: Not on file  Transportation Needs: Not on file  Physical Activity: Not on file  Stress: Not on file  Social Connections: Not on file  Intimate Partner Violence: Not on file    FAMILY HISTORY:  Family History  Problem Relation Age of Onset  . Brain cancer Mother   . Cancer - Other Father        Abdominal   . Colon cancer Sister   . Colon cancer Paternal Grandmother     CURRENT MEDICATIONS:  Current Outpatient Medications  Medication Sig Dispense Refill  . albuterol (PROVENTIL) (2.5 MG/3ML) 0.083% nebulizer solution Take 3 mLs (2.5 mg total) by nebulization every 6 (six) hours as needed for wheezing or shortness of breath. Wilson  mL 1  . ALPRAZolam (XANAX) 1 MG tablet Take 1.5 tablets (1.5 mg total) by mouth daily as needed for anxiety. 45 tablet 2  . aspirin EC 81 MG tablet Take 81 mg by mouth daily.    Marland Kitchen atorvastatin (LIPITOR) 20 MG tablet Take 1 tablet (20 mg total) by mouth daily. 90 tablet 3  . AYR 0.65 % nasal spray SMARTSIG:Both Nares    . brompheniramine-pseudoephedrine-DM 30-2-10 MG/5ML syrup Take by mouth.    . butalbital-acetaminophen-caffeine (FIORICET) 50-325-40 MG tablet SMARTSIG:1 By Mouth 4-5 Times Daily    . cholecalciferol (VITAMIN D3) 25 MCG (1000 UNIT) tablet Take 2 tablets (2,000 Units total) by mouth daily. 60 tablet 6  . folic acid (FOLVITE) 1 MG tablet Take 1 tablet (1 mg total) by mouth daily. 30 tablet 6  . gabapentin (NEURONTIN) 300 MG  capsule Take 1 capsule (300 mg total) by mouth 3 (three) times daily. 90 capsule 3  . HYDROcodone-acetaminophen (NORCO) 10-325 MG tablet Take 1 tablet by mouth every 6 (six) hours as needed for pain.     Marland Kitchen lactose free nutrition (BOOST PLUS) LIQD Take 237 mLs by mouth 3 (three) times daily with meals. Chocolate Boost Plus 150000 mL 3  . megestrol (MEGACE) 400 MG/10ML suspension Take 20 mLs (800 mg total) by mouth daily. 480 mL 2  . methocarbamol (ROBAXIN) 500 MG tablet Take 500 mg by mouth 3 (three) times daily.    . polyethylene glycol (MIRALAX / GLYCOLAX) 17 g packet Take 17 g by mouth daily. 30 packet 0  . predniSONE (DELTASONE) 5 MG tablet Take by mouth.    Marland Kitchen PROVENTIL HFA 108 (90 Base) MCG/ACT inhaler Inhale 2 puffs into the lungs every 6 (six) hours as needed for wheezing. Brand Name Medically Necessary 6.7 g 2  . RESTASIS 0.05 % ophthalmic emulsion SMARTSIG:In Eye(s)    . RETIN-A 0.01 % gel Apply topically at bedtime. 45 g 3  . RETIN-A MICRO 0.1 % gel Apply topically.    Marland Kitchen tiZANidine (ZANAFLEX) 4 MG tablet Take 4 mg by mouth every 8 (eight) hours as needed.    . Vitamin D, Ergocalciferol, (DRISDOL) 1.25 MG (50000 UNIT) CAPS capsule Take 1 capsule (50,000 Units total) by mouth every 7 (seven) days. 5 capsule 6   No current facility-administered medications for this visit.    ALLERGIES:  Allergies  Allergen Reactions  . Bee Venom Anaphylaxis  . Meperidine Hcl Other (See Comments)    Makes her feel strange  . Sulfa Antibiotics Other (See Comments)    Joint pain  . Doxycycline Swelling and Dermatitis    Burning, red skin when she takes it.    PHYSICAL EXAM:  Performance status (ECOG): 1 - Symptomatic but completely ambulatory  Vitals:   01/20/20 1134  BP: 116/75  Pulse: (!) 59  Resp: 17  Temp: 99 F (37.2 C)  SpO2: 99%   Wt Readings from Last 3 Encounters:  01/20/20 143 lb 6.4 oz (65 kg)  09/02/19 136 lb 14.4 oz (62.1 kg)  07/29/19 137 lb 12.8 oz (62.5 kg)   Physical  Exam Vitals reviewed.  Constitutional:      Appearance: Normal appearance.  Cardiovascular:     Rate and Rhythm: Normal rate and regular rhythm.     Pulses: Normal pulses.     Heart sounds: Normal heart sounds.  Pulmonary:     Effort: Pulmonary effort is normal.     Breath sounds: Normal breath sounds.  Chest:  Breasts:  Right: No axillary adenopathy or supraclavicular adenopathy.     Left: No axillary adenopathy or supraclavicular adenopathy.    Abdominal:     Palpations: Abdomen is soft. There is no hepatomegaly, splenomegaly or mass.     Tenderness: There is no abdominal tenderness.     Hernia: No hernia is present.  Lymphadenopathy:     Upper Body:     Right upper body: No supraclavicular, axillary or pectoral adenopathy.     Left upper body: No supraclavicular, axillary or pectoral adenopathy.     Lower Body: No right inguinal adenopathy. No left inguinal adenopathy.  Neurological:     General: No focal deficit present.     Mental Status: She is alert and oriented to person, place, and time.  Psychiatric:        Mood and Affect: Mood normal.        Behavior: Behavior normal.     LABORATORY DATA:  I have reviewed the labs as listed.  CBC Latest Ref Rng & Units 01/17/2020 01/12/2020 11/11/2019  WBC 4.0 - 10.5 K/uL 14.9(H) 14.2(H) 10.0  Hemoglobin 12.0 - 15.0 g/dL 14.5 14.6 13.6  Hematocrit 36.0 - 46.0 % 44.4 43.2 42.2  Platelets 150 - 400 K/uL 327 361 307   CMP Latest Ref Rng & Units 01/17/2020 11/11/2019 07/20/2019  Glucose 70 - 99 mg/dL 87 95 79  BUN 6 - 20 mg/dL 14 6 10   Creatinine 0.44 - 1.00 mg/dL 0.97 0.81 0.81  Sodium 135 - 145 mmol/L 134(L) 138 139  Potassium 3.5 - 5.1 mmol/L 4.1 3.6 3.9  Chloride 98 - 111 mmol/L 104 103 102  CO2 22 - 32 mmol/L 24 25 19(L)  Calcium 8.9 - 10.3 mg/dL 8.9 9.7 9.8  Total Protein 6.5 - 8.1 g/dL 6.5 - 7.1  Total Bilirubin 0.3 - 1.2 mg/dL 0.7 - 0.4  Alkaline Phos 38 - 126 U/L 48 - 63  AST 15 - 41 U/L 15 - 14  ALT 0 - 44  U/L 19 - 7      Component Value Date/Time   RBC 4.57 01/17/2020 1203   MCV 97.2 01/17/2020 1203   MCV 97 07/20/2019 1134   MCH 31.7 01/17/2020 1203   MCHC 32.7 01/17/2020 1203   RDW 15.2 01/17/2020 1203   RDW 12.7 07/20/2019 1134   LYMPHSABS 4.5 (H) 01/12/2020 0912   LYMPHSABS 4.3 (H) 07/20/2019 1134   MONOABS 0.9 01/12/2020 0912   EOSABS 0.1 01/12/2020 0912   EOSABS 0.1 07/20/2019 1134   BASOSABS 0.1 01/12/2020 0912   BASOSABS 0.0 07/20/2019 1134    DIAGNOSTIC IMAGING:  I have independently reviewed the scans and discussed with the patient. DG Abdomen 1 View  Result Date: 01/17/2020 CLINICAL DATA:  59 year old female with abdominal pain. EXAM: ABDOMEN - 1 VIEW COMPARISON:  Abdominal radiograph dated 04/18/2017 FINDINGS: Large amount of stool noted throughout the colon. No bowel dilatation or evidence of obstruction. No free air or radiopaque calculi. Osteopenia with degenerative changes of the spine. No acute osseous pathology. IMPRESSION: Constipation. No bowel obstruction. Electronically Signed   By: Anner Crete M.D.   On: 01/17/2020 17:41     ASSESSMENT:  1. Leukocytosis: -She has history of chronic leukocytosis since 2008, both lymphocytic and granulocytic. -Current active smoker, 1 pack/day for 30 years. -Developed sweating episodes in the head for the past 1 month. 20 pound weight loss in the last 6 months, unintentional. -BCR/ABL and JAK2 V617F is negative.  Flow cytometry was negative.  2. Family  history: -Brother died of brain tumor. Father had abdominal cancer. Younger sister died of colon cancer. Paternal uncle died of throat cancer. Paternal grandmother died of colon cancer.   PLAN:  1. Leukocytosis: -She is continuing to smoke half pack of cigarettes per day. -Reviewed labs from 01/12/2020.  White count is 14.2 with normal hemoglobin and platelets. -We will see her back in 6 months with repeat labs.  If there is significant worsening of counts,  will consider bone marrow biopsy.  2. Smoking history: -CT lung cancer screening protocol on 08/26/2019 was lung RADS 2.  3.  B12 deficiency: -She reports that she stopped taking B12 tablets because of yellow discoloration of urine. -Have suggested her to restart B12 and try a different brand.  4.  Vitamin D deficiency: -Vitamin D level is 68.5.  Discontinue weekly vitamin D. -Start vitamin D 2000 units daily.  5.  Folic acid deficiency: -Folic acid today is 17.9.  Continue folic acid 1 mg tablet daily.   Orders placed this encounter:  Orders Placed This Encounter  Procedures  . CBC with Differential/Platelet  . Comprehensive metabolic panel  . VITAMIN D 25 Hydroxy (Vit-D Deficiency, Fractures)  . Vitamin B12  . Folate     Derek Jack, MD [employer] [phone]   I, Milinda Antis, am acting as a scribe for Dr. Sanda Linger.  I, Derek Jack MD, have reviewed the above documentation for accuracy and completeness, and I agree with the above.

## 2020-01-20 NOTE — Patient Instructions (Addendum)
[facility] at [facility] Discharge Instructions  You were seen today by Dr. Ellin Saba. He went over your recent results. You will be prescribed vitamin D 2,000 units to take daily. Continue taking folic acid 1 mg daily and start taking vitamin B12 1 mg daily. Your next appointment will be with the nurse practitioner in 6 months for labs and follow up.   Thank you for choosing [facility] at [facility] to provide your oncology and hematology care.  To afford each patient quality time with our provider, please arrive at least 15 minutes before your scheduled appointment time.   If you have a lab appointment with the Cancer Center please come in thru the Main Entrance and check in at the main information desk  You need to re-schedule your appointment should you arrive 10 or more minutes late.  We strive to give you quality time with our providers, and arriving late affects you and other patients whose appointments are after yours.  Also, if you no show three or more times for appointments you may be dismissed from the clinic at the providers discretion.     Again, thank you for choosing [facility].  Our hope is that these requests will decrease the amount of time that you wait before being seen by our physicians.       _____________________________________________________________  Should you have questions after your visit to [facility], please contact our office at [phone] between the hours of 8:00 a.m. and 4:30 p.m.  Voicemails left after 4:00 p.m. will not be returned until the following business day.  For prescription refill requests, have your pharmacy contact our office and allow 72 hours.    Cancer Center Support Programs:   > Cancer Support Group  2nd Tuesday of the month 1pm-2pm, Journey Room

## 2020-01-27 ENCOUNTER — Encounter: Payer: Self-pay | Admitting: General Practice

## 2020-02-02 ENCOUNTER — Other Ambulatory Visit: Payer: Self-pay | Admitting: Family

## 2020-02-02 DIAGNOSIS — M5430 Sciatica, unspecified side: Secondary | ICD-10-CM

## 2020-02-02 DIAGNOSIS — F419 Anxiety disorder, unspecified: Secondary | ICD-10-CM

## 2020-02-03 ENCOUNTER — Other Ambulatory Visit: Payer: Self-pay | Admitting: Family

## 2020-02-03 DIAGNOSIS — D72829 Elevated white blood cell count, unspecified: Secondary | ICD-10-CM | POA: Diagnosis not present

## 2020-02-03 NOTE — Telephone Encounter (Signed)
Pharmacy comment: Product Backordered/Unavailable:PRESCRIBED PRODUCT NOT IN STOCK. PLEASE CONSIDER THE COST-EFFECTIVE POTENTIAL ALTERNATIVE(S) LISTED AND EVALUATE IF APPROPRIATE FOR YOUR PATIENT'S INDICATION AND TREATMENT GOALS.

## 2020-02-05 DIAGNOSIS — M7052 Other bursitis of knee, left knee: Secondary | ICD-10-CM | POA: Diagnosis not present

## 2020-02-05 DIAGNOSIS — M76892 Other specified enthesopathies of left lower limb, excluding foot: Secondary | ICD-10-CM | POA: Diagnosis not present

## 2020-02-19 ENCOUNTER — Other Ambulatory Visit: Payer: Self-pay

## 2020-02-19 ENCOUNTER — Ambulatory Visit (INDEPENDENT_AMBULATORY_CARE_PROVIDER_SITE_OTHER): Payer: Medicaid Other | Admitting: Family

## 2020-02-19 ENCOUNTER — Encounter: Payer: Self-pay | Admitting: Family

## 2020-02-19 VITALS — BP 119/83 | HR 72 | Temp 98.1°F | Ht 63.0 in | Wt 152.6 lb

## 2020-02-19 DIAGNOSIS — Z720 Tobacco use: Secondary | ICD-10-CM

## 2020-02-19 DIAGNOSIS — F419 Anxiety disorder, unspecified: Secondary | ICD-10-CM | POA: Diagnosis not present

## 2020-02-19 DIAGNOSIS — M25561 Pain in right knee: Secondary | ICD-10-CM

## 2020-02-19 DIAGNOSIS — G8929 Other chronic pain: Secondary | ICD-10-CM

## 2020-02-19 DIAGNOSIS — I700 Atherosclerosis of aorta: Secondary | ICD-10-CM | POA: Diagnosis not present

## 2020-02-19 DIAGNOSIS — J410 Simple chronic bronchitis: Secondary | ICD-10-CM

## 2020-02-19 DIAGNOSIS — Z114 Encounter for screening for human immunodeficiency virus [HIV]: Secondary | ICD-10-CM

## 2020-02-19 DIAGNOSIS — R350 Frequency of micturition: Secondary | ICD-10-CM | POA: Diagnosis not present

## 2020-02-19 DIAGNOSIS — M5441 Lumbago with sciatica, right side: Secondary | ICD-10-CM

## 2020-02-19 DIAGNOSIS — Z1211 Encounter for screening for malignant neoplasm of colon: Secondary | ICD-10-CM

## 2020-02-19 DIAGNOSIS — E559 Vitamin D deficiency, unspecified: Secondary | ICD-10-CM

## 2020-02-19 DIAGNOSIS — M5442 Lumbago with sciatica, left side: Secondary | ICD-10-CM | POA: Diagnosis not present

## 2020-02-19 DIAGNOSIS — F321 Major depressive disorder, single episode, moderate: Secondary | ICD-10-CM

## 2020-02-19 DIAGNOSIS — Z1159 Encounter for screening for other viral diseases: Secondary | ICD-10-CM

## 2020-02-19 DIAGNOSIS — F1320 Sedative, hypnotic or anxiolytic dependence, uncomplicated: Secondary | ICD-10-CM

## 2020-02-19 DIAGNOSIS — Z79899 Other long term (current) drug therapy: Secondary | ICD-10-CM

## 2020-02-19 DIAGNOSIS — K5900 Constipation, unspecified: Secondary | ICD-10-CM

## 2020-02-19 DIAGNOSIS — E785 Hyperlipidemia, unspecified: Secondary | ICD-10-CM

## 2020-02-19 LAB — URINALYSIS, COMPLETE
Bilirubin, UA: NEGATIVE
Glucose, UA: NEGATIVE
Ketones, UA: NEGATIVE
Leukocytes,UA: NEGATIVE
Nitrite, UA: NEGATIVE
Protein,UA: NEGATIVE
Specific Gravity, UA: 1.015 (ref 1.005–1.030)
Urobilinogen, Ur: 0.2 mg/dL (ref 0.2–1.0)
pH, UA: 6 (ref 5.0–7.5)

## 2020-02-19 LAB — MICROSCOPIC EXAMINATION
Bacteria, UA: NONE SEEN
Epithelial Cells (non renal): NONE SEEN /hpf (ref 0–10)
RBC, Urine: NONE SEEN /hpf (ref 0–2)
WBC, UA: NONE SEEN /hpf (ref 0–5)

## 2020-02-19 MED ORDER — POLYETHYLENE GLYCOL 3350 17 GM/SCOOP PO POWD: 17.0000 g | Freq: Two times a day (BID) | ORAL | 1 refills | Status: DC | PRN

## 2020-02-19 MED ORDER — ROSUVASTATIN CALCIUM 5 MG PO TABS: 5.0000 mg | ORAL_TABLET | Freq: Every day | ORAL | 3 refills | Status: DC

## 2020-02-19 MED ORDER — ALBUTEROL SULFATE (2.5 MG/3ML) 0.083% IN NEBU
2.5000 mg | INHALATION_SOLUTION | Freq: Four times a day (QID) | RESPIRATORY_TRACT | 1 refills | Status: DC | PRN
Start: 2020-02-19 — End: 2023-04-22

## 2020-02-19 MED ORDER — ALPRAZOLAM 1 MG PO TABS: 1.5000 mg | ORAL_TABLET | Freq: Every day | ORAL | 0 refills | Status: DC | PRN

## 2020-02-19 NOTE — Patient Instructions (Signed)
Constipation, Adult Constipation is when a person has fewer than three bowel movements in a week, has difficulty having a bowel movement, or has stools (feces) that are dry, hard, or larger than normal. Constipation may be caused by an underlying condition. It may become worse with age if a person takes certain medicines and does not take in enough fluids. Follow these instructions at home: Eating and drinking  Eat foods that have a lot of fiber, such as beans, whole grains, and fresh fruits and vegetables.  Limit foods that are low in fiber and high in fat and processed sugars, such as fried or sweet foods. These include french fries, hamburgers, cookies, candies, and soda.  Drink enough fluid to keep your urine pale yellow.   General instructions  Exercise regularly or as told by your health care provider. Try to do 150 minutes of moderate exercise each week.  Use the bathroom when you have the urge to go. Do not hold it in.  Take over-the-counter and prescription medicines only as told by your health care provider. This includes any fiber supplements.  During bowel movements: ? Practice deep breathing while relaxing the lower abdomen. ? Practice pelvic floor relaxation.  Watch your condition for any changes. Let your health care provider know about them.  Keep all follow-up visits as told by your health care provider. This is important. Contact a health care provider if:  You have pain that gets worse.  You have a fever.  You do not have a bowel movement after 4 days.  You vomit.  You are not hungry or you lose weight.  You are bleeding from the opening between the buttocks (anus).  You have thin, pencil-like stools. Get help right away if:  You have a fever and your symptoms suddenly get worse.  You leak stool or have blood in your stool.  Your abdomen is bloated.  You have severe pain in your abdomen.  You feel dizzy or you faint. Summary  Constipation is  when a person has fewer than three bowel movements in a week, has difficulty having a bowel movement, or has stools (feces) that are dry, hard, or larger than normal.  Eat foods that have a lot of fiber, such as beans, whole grains, and fresh fruits and vegetables.  Drink enough fluid to keep your urine pale yellow.  Take over-the-counter and prescription medicines only as told by your health care provider. This includes any fiber supplements. This information is not intended to replace advice given to you by your health care provider. Make sure you discuss any questions you have with your health care provider. Document Revised: 11/26/2018 Document Reviewed: 11/26/2018 Elsevier Patient Education  2021 [place]. http://[email or web address].NIH.Gov">  Generalized Anxiety Disorder, Adult Generalized anxiety disorder (GAD) is a mental health condition. Unlike normal worries, anxiety related to GAD is not triggered by a specific event. These worries do not fade or get better with time. GAD interferes with relationships, work, and school. GAD symptoms can vary from mild to severe. People with severe GAD can have intense waves of anxiety with physical symptoms that are similar to panic attacks. What are the causes? The exact cause of GAD is not known, but the following are believed to have an impact:  Differences in natural brain chemicals.  Genes passed down from parents to children.  Differences in the way threats are perceived.  Development during childhood.  Personality. What increases the risk? The following factors may make you more likely to  develop this condition:  Being female.  Having a family history of anxiety disorders.  Being very shy.  Experiencing very stressful life events, such as the death of a loved one.  Having a very stressful family environment. What are the signs or symptoms? People with GAD often worry excessively about many things in their lives, such as their health  and family. Symptoms may also include:  Mental and emotional symptoms: ? Worrying excessively about natural disasters. ? Fear of being late. ? Difficulty concentrating. ? Fears that others are judging your performance.  Physical symptoms: ? Fatigue. ? Headaches, muscle tension, muscle twitches, trembling, or feeling shaky. ? Feeling like your heart is pounding or beating very fast. ? Feeling out of breath or like you cannot take a deep breath. ? Having trouble falling asleep or staying asleep, or experiencing restlessness. ? Sweating. ? Nausea, diarrhea, or irritable bowel syndrome (IBS).  Behavioral symptoms: ? Experiencing erratic moods or irritability. ? Avoidance of new situations. ? Avoidance of people. ? Extreme difficulty making decisions. How is this diagnosed? This condition is diagnosed based on your symptoms and medical history. You will also have a physical exam. Your health care provider may perform tests to rule out other possible causes of your symptoms. To be diagnosed with GAD, a person must have anxiety that:  Is out of his or her control.  Affects several different aspects of his or her life, such as work and relationships.  Causes distress that makes him or her unable to take part in normal activities.  Includes at least three symptoms of GAD, such as restlessness, fatigue, trouble concentrating, irritability, muscle tension, or sleep problems. Before your health care provider can confirm a diagnosis of GAD, these symptoms must be present more days than they are not, and they must last for 6 months or longer. How is this treated? This condition may be treated with:  Medicine. Antidepressant medicine is usually prescribed for long-term daily control. Anti-anxiety medicines may be added in severe cases, especially when panic attacks occur.  Talk therapy (psychotherapy). Certain types of talk therapy can be helpful in treating GAD by providing support,  education, and guidance. Options include: ? Cognitive behavioral therapy (CBT). People learn coping skills and self-calming techniques to ease their physical symptoms. They learn to identify unrealistic thoughts and behaviors and to replace them with more appropriate thoughts and behaviors. ? Acceptance and commitment therapy (ACT). This treatment teaches people how to be mindful as a way to cope with unwanted thoughts and feelings. ? Biofeedback. This process trains you to manage your body's response (physiological response) through breathing techniques and relaxation methods. You will work with a therapist while machines are used to monitor your physical symptoms.  Stress management techniques. These include yoga, meditation, and exercise. A mental health specialist can help determine which treatment is best for you. Some people see improvement with one type of therapy. However, other people require a combination of therapies.   Follow these instructions at home: Lifestyle  Maintain a consistent routine and schedule.  Anticipate stressful situations. Create a plan, and allow extra time to work with your plan.  Practice stress management or self-calming techniques that you have learned from your therapist or your health care provider. General instructions  Take over-the-counter and prescription medicines only as told by your health care provider.  Understand that you are likely to have setbacks. Accept this and be kind to yourself as you persist to take better care of yourself.  Recognize  and accept your accomplishments, even if you judge them as small.  Keep all follow-up visits as told by your health care provider. This is important. Contact a health care provider if:  Your symptoms do not get better.  Your symptoms get worse.  You have signs of depression, such as: ? A persistently sad or irritable mood. ? Loss of enjoyment in activities that used to bring you joy. ? Change in  weight or eating. ? Changes in sleeping habits. ? Avoiding friends or family members. ? Loss of energy for normal tasks. ? Feelings of guilt or worthlessness. Get help right away if:  You have serious thoughts about hurting yourself or others. If you ever feel like you may hurt yourself or others, or have thoughts about taking your own life, get help right away. Go to your nearest emergency department or:  Call your local emergency services (911 in the [place]  Call a suicide crisis helpline, such as the [facility] at 949 198 2655. This is open 24 hours a day in the [place]  Text the Crisis Text Line at 838-478-5124 (in the [place].). Summary  Generalized anxiety disorder (GAD) is a mental health condition that involves worry that is not triggered by a specific event.  People with GAD often worry excessively about many things in their lives, such as their health and family.  GAD may cause symptoms such as restlessness, trouble concentrating, sleep problems, frequent sweating, nausea, diarrhea, headaches, and trembling or muscle twitching.  A mental health specialist can help determine which treatment is best for you. Some people see improvement with one type of therapy. However, other people require a combination of therapies. This information is not intended to replace advice given to you by your health care provider. Make sure you discuss any questions you have with your health care provider. Document Revised: 10/29/2018 Document Reviewed: 10/29/2018 Elsevier Patient Education  [place].

## 2020-02-19 NOTE — Progress Notes (Signed)
Subjective:    Patient ID: [name], female    DOB: 1960-07-31, 60 y.o.   MRN: [id number]  Chief Complaint  Patient presents with  . Medical Management of Chronic Issues  . Anxiety    Wants increase in [place]    Ptpresents to the office today chronic follow up.She continues to have increased anxiety.She has tried Lexapro, Cymbalta but can not tolerate it. She is upset that her was decreased to  Xanax 1.5 mg daily. She wants referral to behavorial health.   She is followed by Pain [facility] 3 months for chronic back pain.    She is followed by Hematologists every 6 months for Leukocytosis.   She is a 1/2 pack smoker for the last 30 years.She reports she has intermittent SOB. Anxiety Presents for follow-up visit. Symptoms include depressed mood, excessive worry, irritability, nervous/anxious behavior and restlessness. Symptoms occur occasionally. The severity of symptoms is moderate.    Depression        This is a chronic problem.  The current episode started more than 1 year ago.   The problem occurs intermittently.  Associated symptoms include irritable, restlessness, decreased interest and sad.  Associated symptoms include no helplessness and no hopelessness.  Past treatments include nothing.  Compliance with treatment is good.  Past medical history includes anxiety.   Back Pain This is a chronic problem. The current episode started more than 1 year ago. The problem occurs intermittently. The problem has been waxing and waning since onset. The pain is present in the lumbar spine. The pain is at a severity of 6/10. The pain is moderate. She has tried muscle relaxant and NSAIDs for the symptoms. The treatment provided mild relief.  Nicotine Dependence Presents for follow-up visit. Symptoms include irritability. Her urge triggers include company of smokers. The symptoms have been stable. She smokes < 1/2 a pack of cigarettes per day.   Constipation This is a chronic problem. The current episode started more than 1 year ago. The problem has been waxing and waning since onset. Associated symptoms include back pain. She has tried laxatives for the symptoms. The treatment provided moderate relief.  Knee Pain  The incident occurred more than 1 week ago. There was no injury mechanism. The pain is present in the left knee. The quality of the pain is described as aching. The pain is moderate. The pain has been intermittent since onset. She has tried rest for the symptoms.  Urinary Frequency  This is a new problem. The current episode started more than 1 month ago. The pain is at a severity of 0/10. Associated symptoms include frequency.  COPD Continues to smoke 1/2 pack a day. She uses albuterol inhaler. She reports her breathing is "ok", but does have SOB.    Review of Systems  Constitutional: Positive for irritability.  Gastrointestinal: Positive for constipation.  Genitourinary: Positive for frequency.  Musculoskeletal: Positive for back pain.  Psychiatric/Behavioral: Positive for depression. The patient is nervous/anxious.   All other systems reviewed and are negative.      Objective:   Physical Exam Vitals reviewed.  Constitutional:      General: She is irritable. She is not in acute distress.    Appearance: She is well-developed and well-nourished.  HENT:     Head: Normocephalic and atraumatic.     Comments: Hoarse voice    Right Ear: Tympanic membrane normal.     Left Ear: Tympanic membrane normal.  Mouth/Throat:     Mouth: Oropharynx is clear and moist.  Eyes:     Pupils: Pupils are equal, round, and reactive to light.  Neck:     Thyroid: No thyromegaly.  Cardiovascular:     Rate and Rhythm: Normal rate and regular rhythm.     Pulses: Intact distal pulses.     Heart sounds: Normal heart sounds. No murmur heard.   Pulmonary:     Effort: Pulmonary effort is normal. No respiratory distress.     Breath  sounds: Decreased breath sounds present. No wheezing.  Abdominal:     General: Bowel sounds are normal. There is no distension.     Palpations: Abdomen is soft.     Tenderness: There is no abdominal tenderness.  Musculoskeletal:        General: Tenderness present. No edema. Normal range of motion.     Cervical back: Normal range of motion and neck supple.     Comments: Tenderness in medial knee with palpation   Skin:    General: Skin is warm and dry.  Neurological:     Mental Status: She is alert and oriented to person, place, and time.     Cranial Nerves: No cranial nerve deficit.     Deep Tendon Reflexes: Reflexes are normal and symmetric.  Psychiatric:        Mood and Affect: Mood and affect normal.        Behavior: Behavior normal.        Thought Content: Thought content normal.        Judgment: Judgment normal.       BP 119/83   Pulse 72   Temp 98.1 F (36.7 C) (Temporal)   Ht 5' 3"  (1.6 m)   Wt 152 lb 9.6 oz (69.2 kg)   BMI 27.03 kg/m      Assessment & Plan:  DANAJAH BIRDSELL comes in today with chief complaint of Medical Management of Chronic Issues and Anxiety (Wants increase in [place] )   Diagnosis and orders addressed:  1. Aortic atherosclerosis (HCC) - CMP14+EGFR  2. Simple chronic bronchitis (HCC) - CMP14+EGFR  3. Anxiety - CMP14+EGFR - Ambulatory referral to Psychiatry - ALPRAZolam (XANAX) 1 MG tablet; Take 1.5 tablets (1.5 mg total) by mouth daily as needed for anxiety.  Dispense: 30 tablet; Refill: 0  4. Chronic bilateral low back pain with bilateral sciatica - CMP14+EGFR  5. Current moderate episode of major depressive disorder, unspecified whether recurrent ([place]) - CMP14+EGFR - Ambulatory referral to Psychiatry  6. Hyperlipidemia, unspecified hyperlipidemia type -Will stop Lipitor and start Crestor - rosuvastatin (CRESTOR) 5 MG tablet; Take 1 tablet (5 mg total) by mouth daily.  Dispense: 90 tablet;  Refill: 3 - CMP14+EGFR  7. Tobacco user - CMP14+EGFR  8. Vitamin D deficiency - CMP14+EGFR  9. Constipation, unspecified constipation type - polyethylene glycol powder (GLYCOLAX/MIRALAX) 17 GM/SCOOP powder; Take 17 g by mouth 2 (two) times daily as needed.  Dispense: 3350 g; Refill: 1  10. Colon cancer screening - Ambulatory referral to Gastroenterology  11. Need for hepatitis C screening test - Hepatitis C antibody  12. Encounter for screening for HIV - HIV Antibody (routine testing w rflx)  13. Benzodiazepine dependence (HCC - ALPRAZolam (XANAX) 1 MG tablet; Take 1.5 tablets (1.5 mg total) by mouth daily as needed for anxiety.  Dispense: 30 tablet; Refill: 0 - ToxASSURE Select 13 (MW), Urine  14. Controlled substance agreement signed - ALPRAZolam Duanne Moron)  1 MG tablet; Take 1.5 tablets (1.5 mg total) by mouth daily as needed for anxiety.  Dispense: 30 tablet; Refill: 0 - ToxASSURE Select 13 (MW), Urine  15. Acute pain of right knee - Ambulatory referral to Physical Therapy  16. Urinary frequency - Urinalysis, Complete   Will decrease xanax to #30 from #45. Referral to [facility] pending. I will no longer prescribe xanax.  Labs pending Health Maintenance reviewed Diet and exercise encouraged  Follow up plan: 6 months    [name], FNP

## 2020-02-20 LAB — HEPATITIS C ANTIBODY: Hep C Virus Ab: <0.1 s/co ratio (ref 0.0–0.9)

## 2020-02-20 LAB — CMP14+EGFR
ALT: 13 IU/L (ref 0–32)
AST: 16 IU/L (ref 0–40)
Albumin/Globulin Ratio: 2 (ref 1.2–2.2)
Albumin: 4.5 g/dL (ref 3.8–4.9)
Alkaline Phosphatase: 78 IU/L (ref 44–121)
BUN/Creatinine Ratio: 12 (ref 9–23)
BUN: 12 mg/dL (ref 6–24)
Bilirubin Total: <0.2 mg/dL (ref 0.0–1.2)
CO2: 22 mmol/L (ref 20–29)
Calcium: 9.9 mg/dL (ref 8.7–10.2)
Chloride: 104 mmol/L (ref 96–106)
Creatinine, Ser: 1.04 mg/dL — ABNORMAL HIGH (ref 0.57–1.00)
GFR calc Af Amer: 68 mL/min/{1.73_m2} (ref >59)
GFR calc non Af Amer: 59 mL/min/{1.73_m2} — ABNORMAL LOW (ref >59)
Globulin, Total: 2.3 g/dL (ref 1.5–4.5)
Glucose: 84 mg/dL (ref 65–99)
Potassium: 4.6 mmol/L (ref 3.5–5.2)
Sodium: 141 mmol/L (ref 134–144)
Total Protein: 6.8 g/dL (ref 6.0–8.5)

## 2020-02-20 LAB — HIV ANTIBODY (ROUTINE TESTING W REFLEX): HIV Screen 4th Generation wRfx: NONREACTIVE

## 2020-03-05 LAB — TOXASSURE SELECT 13 (MW), URINE

## 2020-03-07 ENCOUNTER — Other Ambulatory Visit (HOSPITAL_COMMUNITY): Payer: Self-pay | Admitting: Hematology

## 2020-03-09 DIAGNOSIS — D72829 Elevated white blood cell count, unspecified: Secondary | ICD-10-CM | POA: Diagnosis not present

## 2020-03-09 DIAGNOSIS — M5416 Radiculopathy, lumbar region: Secondary | ICD-10-CM | POA: Diagnosis not present

## 2020-03-09 DIAGNOSIS — M47816 Spondylosis without myelopathy or radiculopathy, lumbar region: Secondary | ICD-10-CM | POA: Diagnosis not present

## 2020-03-13 ENCOUNTER — Other Ambulatory Visit: Payer: Self-pay | Admitting: Family

## 2020-03-13 DIAGNOSIS — M5430 Sciatica, unspecified side: Secondary | ICD-10-CM

## 2020-03-13 DIAGNOSIS — F419 Anxiety disorder, unspecified: Secondary | ICD-10-CM

## 2020-03-15 ENCOUNTER — Encounter: Payer: Self-pay | Admitting: Physical Therapy

## 2020-03-15 ENCOUNTER — Other Ambulatory Visit: Payer: Self-pay

## 2020-03-15 ENCOUNTER — Ambulatory Visit: Payer: Medicaid Other | Attending: Family | Admitting: Physical Therapy

## 2020-03-15 DIAGNOSIS — G8929 Other chronic pain: Secondary | ICD-10-CM | POA: Insufficient documentation

## 2020-03-15 DIAGNOSIS — M6281 Muscle weakness (generalized): Secondary | ICD-10-CM | POA: Insufficient documentation

## 2020-03-15 DIAGNOSIS — M25561 Pain in right knee: Secondary | ICD-10-CM | POA: Diagnosis not present

## 2020-03-15 DIAGNOSIS — M25562 Pain in left knee: Secondary | ICD-10-CM | POA: Insufficient documentation

## 2020-03-15 NOTE — Therapy (Signed)
[facility] [place], [place], [address] Phone: [phone]   Fax:  [phone]  Physical Therapy Evaluation  Patient Details  Name: [name] MRN: [id number] Date of Birth: Feb 01, 1960 Referring Provider (PT): [name], FNP   Encounter Date: 03/15/2020   PT End of Session - 03/15/20 1302    Visit Number 1    Number of Visits 8    Date for PT Re-Evaluation 05/10/20    Authorization Type Medicaid    PT Start Time 0815    PT Stop Time 0850    PT Time Calculation (min) 35 min    Activity Tolerance Patient tolerated treatment well    Behavior During Therapy [facility] for tasks assessed/performed           Past Medical History:  Diagnosis Date  . Anxiety   . Arthritis   . Back pain   . Depression   . Hip pain   . HSV-1 infection   . Hyperlipidemia   . Right leg pain   . Sciatica   . Tobacco user     Past Surgical History:  Procedure Laterality Date  . BUNIONECTOMY    . CATARACT EXTRACTION    . CESAREAN SECTION    . COLONOSCOPY W/ POLYPECTOMY    . ELBOW SURGERY    . NOVASURE ABLATION    . RHYTIDECTOMY NECK / CHEEK / CHIN    . TONSILLECTOMY    . TUBAL LIGATION      There were no vitals filed for this visit.    Subjective Assessment - 03/15/20 1429    Subjective COVID-19 screening performed upon arrival. Patient arrives to physical therapy with bilateral knee pain that began about 5 months ago, September 2021 due to an unknown cause. Patient reports more L knee pain in which she had to compensate with her right LE. Patient reports ability to perform all ADLs but with pain. Patient reports more pain across the knee with twisting and going up steps. Patient reports numbness and deep pain and states pain at worst as 4/10 and pain at best as 0/10 with rest. Patient's goals are to decrease pain and return to PLOF.    How long can you sit comfortably? unlimited    How long can you stand comfortably? "10 mins"     How long can you walk comfortably? "around home"    Diagnostic tests none    Patient Stated Goals decrease pain    Currently in Pain? No/denies              [facility] PT Assessment - 03/15/20 0001      Assessment   Medical Diagnosis Acute pain of right knee    Referring Provider (PT) [name], FNP    Onset Date/Surgical Date --   5 months ago   Next MD Visit n/a    Prior Therapy no      Precautions   Precautions None      Restrictions   Weight Bearing Restrictions No      Balance Screen   Has the patient fallen in the past 6 months No    Has the patient had a decrease in activity level because of a fear of falling?  No    Is the patient reluctant to leave their home because of a fear of falling?  No      Home Environment   Living Environment Private residence    Type of [place]  Access Stairs to enter    Entrance Stairs-Number of Steps 5    Entrance Stairs-Rails Right;Left;Cannot reach both      Prior Function   Level of Independence Independent with basic ADLs      Observation/Other Assessments-Edema    Edema Circumferential      Circumferential Edema   Circumferential - Right 34.0 cm at mid patella    Circumferential - Left  34.5 cm at mid patella      ROM / Strength   AROM / PROM / Strength AROM;Strength;PROM      AROM   Overall AROM  Within functional limits for tasks performed;Due to pain    AROM Assessment Site Knee    Right/Left Knee Left    Left Knee Extension -1   hyperextension   Left Knee Flexion 131      PROM   Overall PROM  Within functional limits for tasks performed    PROM Assessment Site Knee    Right/Left Knee Left    Left Knee Extension -1    Left Knee Flexion 146      Strength   Overall Strength Deficits;Due to pain    Strength Assessment Site Knee;Hip    Right/Left Hip Left;Right    Right Hip Flexion 3+/5    Right Hip Extension 3-/5    Right Hip ABduction 3-/5    Left Hip Flexion 3+/5    Left Hip Extension 3-/5     Left Hip ABduction 3-/5    Right/Left Knee Right;Left    Right Knee Flexion 4-/5    Right Knee Extension 4-/5    Left Knee Flexion 4-/5    Left Knee Extension 4-/5      Palpation   Patella mobility WNL    Palpation comment tenderness to left medial and lateral joint line      Special Tests    Special Tests Knee Special Tests    Other special tests (-) anterior and posterior drawer, (-) varus and valgus tests    Knee Special tests  Patellofemoral Apprehension Test      Patellofemoral Apprehension Test    Findings Positive    Side  Left      Transfers   Five time sit to stand comments  18.6 seconds no UE                      Objective measurements completed on examination: See above findings.               PT Education - 03/15/20 1439    Education Details quad sets, hamstring sets, hip abdcution and pillow squeeze    Person(s) Educated Patient    Methods Explanation;Demonstration;Handout    Comprehension Verbalized understanding            PT Short Term Goals - 03/15/20 1444      PT SHORT TERM GOAL #1   Title Patient will be independent with initial HEP    Baseline No knowledge of HEP    Time 3    Period Weeks    Status New      PT SHORT TERM GOAL #2   Title Patient will report a 10% decrease of knee pain to improve ability to perform functional tasks.    Baseline worst pain 5/10    Time 3    Period Weeks    Status New             PT Long Term Goals - 03/15/20  [place] #1   Title Patient will be independent with advanced HEP    Baseline no knowledge of exercises    Time 4    Period Weeks    Status New      PT LONG TERM GOAL #2   Title Patient will demonstrate 4/5 or greater bilateral LE MMT to improve stability during functional tasks.    Baseline 3+/5 to 4-/5    Time 4    Period Weeks    Status New      PT LONG TERM GOAL #3   Title Patient will report ability to perform reciprocating stair  negotiation with 1 railing to safely enter/exit home    Baseline step to step negotiation.    Time 4    Period Weeks    Status New                  Plan - 03/15/20 1439    Clinical Impression Statement Patient is a 60 year old female who presents to physical therapy with bilateral knee pain, L>R, decreased LE MMT, and difficulty walking. Patient's ligamentous testing (-). Patient's 5x sit to stand time of 18 seconds categorizes her as a fall risk with decreased functional LE strength. Patient and PT discussed HEP and plan of care to which patient reported undertanding. Patient educated on the importance of performing HEP to maximize PT. Patient requested to perform PT at 1x per week. Patient would benefit from skilled physical therapy to address deficits and address patient's goals.    Personal Factors and Comorbidities Comorbidity 2    Comorbidities bee allergy, sulfa drugs allergy, COPD, heart murmur    Examination-Activity Limitations Stairs;Stand;Locomotion Level;Bed Mobility    Stability/Clinical Decision Making Stable/Uncomplicated    Clinical Decision Making Low    Rehab Potential Fair    PT Frequency 1x / week    PT Duration 4 weeks    PT Treatment/Interventions ADLs/Self Care Home Management;Cryotherapy;Electrical Stimulation;Iontophoresis 4mg /ml Dexamethasone;Moist Heat;Ultrasound;Gait training;Stair training;Functional mobility training;Therapeutic activities;Therapeutic exercise;Balance training;Neuromuscular re-education;Manual techniques;Passive range of motion;Patient/family education;Vasopneumatic Device;Taping    PT Next Visit Plan nustep, LE strengthening and stretching, balance activities, modalities PRN for pain relief    PT Home Exercise Plan see patient education section    Consulted and Agree with Plan of Care Patient           Patient will benefit from skilled therapeutic intervention in order to improve the following deficits and impairments:  Abnormal  gait,Decreased activity tolerance,Decreased balance,Decreased strength,Decreased range of motion,Difficulty walking,Pain  Visit Diagnosis: Chronic pain of right knee - Plan: PT plan of care cert/re-cert  Muscle weakness (generalized) - Plan: PT plan of care cert/re-cert  Chronic pain of left knee - Plan: PT plan of care cert/re-cert     Problem List Patient Active Problem List   Diagnosis Date Noted  . Cough 10/19/2019  . Vitamin D deficiency 07/20/2019  . Benzodiazepine dependence ([place]) 11/29/2017  . Controlled substance agreement signed 11/29/2017  . Aortic atherosclerosis ([place]) 03/28/2017  . COPD (chronic obstructive pulmonary disease) ([place]) 12/10/2016  . HSV-1 infection   . Tobacco user   . Anxiety   . Depression   . Hyperlipidemia   . Sciatica   . Back pain   . Right leg pain   . Hip pain   . Postmenopausal atrophic vaginitis 04/15/2012  . Pelvic pain in female 04/15/2012  . HEMORRHAGE OF RECTUM AND ANUS 06/16/2009    Gabriela Eves, PT,  DPT 03/15/2020, 2:58 PM  [facility] [address] [place], [place], [address] Phone: [phone]   Fax:  [phone]  Name: [name] MRN: [id number] Date of Birth: 06/16/1960

## 2020-03-31 ENCOUNTER — Telehealth (HOSPITAL_COMMUNITY): Payer: Medicaid Other | Admitting: Psychiatry

## 2020-04-06 DIAGNOSIS — D72829 Elevated white blood cell count, unspecified: Secondary | ICD-10-CM | POA: Diagnosis not present

## 2020-04-07 ENCOUNTER — Other Ambulatory Visit: Payer: Self-pay | Admitting: Family

## 2020-04-07 DIAGNOSIS — K5900 Constipation, unspecified: Secondary | ICD-10-CM

## 2020-04-13 ENCOUNTER — Other Ambulatory Visit: Payer: Self-pay | Admitting: Family

## 2020-04-13 DIAGNOSIS — R630 Anorexia: Secondary | ICD-10-CM

## 2020-04-13 DIAGNOSIS — R634 Abnormal weight loss: Secondary | ICD-10-CM

## 2020-04-26 ENCOUNTER — Telehealth: Payer: Self-pay

## 2020-04-26 DIAGNOSIS — F321 Major depressive disorder, single episode, moderate: Secondary | ICD-10-CM

## 2020-04-26 DIAGNOSIS — F419 Anxiety disorder, unspecified: Secondary | ICD-10-CM

## 2020-04-27 NOTE — Telephone Encounter (Signed)
Referral placed.

## 2020-05-06 DIAGNOSIS — D72829 Elevated white blood cell count, unspecified: Secondary | ICD-10-CM | POA: Diagnosis not present

## 2020-05-10 ENCOUNTER — Other Ambulatory Visit: Payer: Self-pay | Admitting: Family

## 2020-05-10 DIAGNOSIS — F419 Anxiety disorder, unspecified: Secondary | ICD-10-CM

## 2020-05-10 DIAGNOSIS — M5430 Sciatica, unspecified side: Secondary | ICD-10-CM

## 2020-05-23 ENCOUNTER — Encounter: Payer: Self-pay | Admitting: Family Medicine

## 2020-05-25 ENCOUNTER — Encounter: Payer: Self-pay | Admitting: Family Medicine

## 2020-05-26 ENCOUNTER — Other Ambulatory Visit: Payer: Self-pay | Admitting: Family

## 2020-05-26 DIAGNOSIS — R634 Abnormal weight loss: Secondary | ICD-10-CM

## 2020-05-26 DIAGNOSIS — F419 Anxiety disorder, unspecified: Secondary | ICD-10-CM

## 2020-05-26 DIAGNOSIS — R630 Anorexia: Secondary | ICD-10-CM

## 2020-05-26 DIAGNOSIS — K5900 Constipation, unspecified: Secondary | ICD-10-CM

## 2020-05-26 DIAGNOSIS — M5430 Sciatica, unspecified side: Secondary | ICD-10-CM

## 2020-06-06 ENCOUNTER — Encounter: Payer: Self-pay | Admitting: Family Medicine

## 2020-06-06 DIAGNOSIS — M47816 Spondylosis without myelopathy or radiculopathy, lumbar region: Secondary | ICD-10-CM | POA: Diagnosis not present

## 2020-06-06 DIAGNOSIS — M546 Pain in thoracic spine: Secondary | ICD-10-CM | POA: Diagnosis not present

## 2020-06-07 DIAGNOSIS — D72829 Elevated white blood cell count, unspecified: Secondary | ICD-10-CM | POA: Diagnosis not present

## 2020-06-23 ENCOUNTER — Telehealth (HOSPITAL_COMMUNITY): Payer: Medicaid Other | Admitting: Psychiatry

## 2020-06-26 ENCOUNTER — Other Ambulatory Visit: Payer: Self-pay | Admitting: Family

## 2020-07-07 DIAGNOSIS — D72829 Elevated white blood cell count, unspecified: Secondary | ICD-10-CM | POA: Diagnosis not present

## 2020-07-18 DIAGNOSIS — F419 Anxiety disorder, unspecified: Secondary | ICD-10-CM | POA: Diagnosis not present

## 2020-07-18 DIAGNOSIS — R531 Weakness: Secondary | ICD-10-CM | POA: Diagnosis not present

## 2020-07-18 DIAGNOSIS — R0602 Shortness of breath: Secondary | ICD-10-CM | POA: Diagnosis not present

## 2020-07-18 DIAGNOSIS — D72829 Elevated white blood cell count, unspecified: Secondary | ICD-10-CM | POA: Diagnosis not present

## 2020-07-18 DIAGNOSIS — E538 Deficiency of other specified B group vitamins: Secondary | ICD-10-CM | POA: Diagnosis not present

## 2020-07-21 ENCOUNTER — Telehealth (HOSPITAL_COMMUNITY): Payer: Medicaid Other | Admitting: Psychiatry

## 2020-07-21 ENCOUNTER — Telehealth (HOSPITAL_COMMUNITY): Payer: Self-pay | Admitting: Psychiatry

## 2020-07-21 DIAGNOSIS — D72829 Elevated white blood cell count, unspecified: Secondary | ICD-10-CM | POA: Diagnosis not present

## 2020-07-21 DIAGNOSIS — E538 Deficiency of other specified B group vitamins: Secondary | ICD-10-CM | POA: Diagnosis not present

## 2020-07-21 DIAGNOSIS — R531 Weakness: Secondary | ICD-10-CM | POA: Diagnosis not present

## 2020-07-21 DIAGNOSIS — F419 Anxiety disorder, unspecified: Secondary | ICD-10-CM | POA: Diagnosis not present

## 2020-07-21 DIAGNOSIS — R0602 Shortness of breath: Secondary | ICD-10-CM | POA: Diagnosis not present

## 2020-07-21 NOTE — Telephone Encounter (Signed)
NO [place] for appointment, request sent at [place] . She did not join virtual

## 2020-07-27 ENCOUNTER — Inpatient Hospital Stay (HOSPITAL_COMMUNITY): Payer: Medicaid Other | Attending: Hematology

## 2020-08-03 ENCOUNTER — Ambulatory Visit (HOSPITAL_COMMUNITY): Payer: Medicaid Other | Admitting: Physician Assistant

## 2020-08-10 DIAGNOSIS — D72829 Elevated white blood cell count, unspecified: Secondary | ICD-10-CM | POA: Diagnosis not present

## 2020-08-21 ENCOUNTER — Other Ambulatory Visit (HOSPITAL_COMMUNITY): Payer: Self-pay | Admitting: Hematology

## 2020-08-25 ENCOUNTER — Ambulatory Visit (INDEPENDENT_AMBULATORY_CARE_PROVIDER_SITE_OTHER): Payer: Medicaid Other | Admitting: Family Medicine

## 2020-08-25 ENCOUNTER — Encounter: Payer: Self-pay | Admitting: Family Medicine

## 2020-08-25 ENCOUNTER — Other Ambulatory Visit: Payer: Self-pay

## 2020-08-25 VITALS — BP 114/79 | HR 64 | Temp 97.4°F | Ht 63.0 in | Wt 146.0 lb

## 2020-08-25 DIAGNOSIS — M65311 Trigger thumb, right thumb: Secondary | ICD-10-CM | POA: Diagnosis not present

## 2020-08-25 DIAGNOSIS — R110 Nausea: Secondary | ICD-10-CM

## 2020-08-25 MED ORDER — NAPROXEN 500 MG PO TABS
500.0000 mg | ORAL_TABLET | Freq: Two times a day (BID) | ORAL | 0 refills | Status: AC
Start: 2020-08-25 — End: 2020-09-08

## 2020-08-25 MED ORDER — ONDANSETRON HCL 4 MG PO TABS: 4.0000 mg | ORAL_TABLET | Freq: Three times a day (TID) | ORAL | 0 refills | Status: DC | PRN

## 2020-08-25 NOTE — Progress Notes (Signed)
Acute Office Visit  Subjective:    Patient ID: [name], female    DOB: 02-May-1960, 60 y.o.   MRN: [id number]  Chief Complaint  Patient presents with   Hand Pain    Right thumb    Hand Pain  Pertinent negatives include no chest pain.  Pt presents today with complaints of right thumb pain. This is a new problem. The current episode started 3 weeks ago. The problem occurs several times per day. The problem has been worsening. The quality of the pain is described as sharp to aching. The pain is at a severity of 6/10. The pain does not radiate. Associated symptoms include nausea when the pain is bad. Pt denies injury. Pt has tried nothing for the symptoms.   Past Medical History:  Diagnosis Date   Anxiety    Arthritis    Back pain    Depression    Hip pain    HSV-1 infection    Hyperlipidemia    Right leg pain    Sciatica    Tobacco user     Past Surgical History:  Procedure Laterality Date   BUNIONECTOMY     CATARACT EXTRACTION     CESAREAN SECTION     COLONOSCOPY W/ POLYPECTOMY     ELBOW SURGERY     NOVASURE ABLATION     RHYTIDECTOMY NECK / CHEEK / CHIN     TONSILLECTOMY     TUBAL LIGATION      Family History  Problem Relation Age of Onset   Brain cancer Mother    Cancer - Other Father        Abdominal    Colon cancer Sister    Colon cancer Paternal Grandmother     Social History   Socioeconomic History   Marital status: Divorced    Spouse name: Not on file   Number of children: 2   Years of education: Not on file   Highest education level: Not on file  Occupational History   Occupation: disabled  Tobacco Use   Smoking status: Every Day    Packs/day: 1.00    Types: Cigarettes    Start date: 01/23/1976   Smokeless tobacco: Never  Vaping Use   Vaping Use: Never used  Substance and Sexual Activity   Alcohol use: No   Drug use: No   Sexual activity: Yes  Other Topics Concern   Not on file  Social History Narrative   She lives with her  children, she is a Materials engineer, 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    Social Determinants of Health   Financial Resource Strain: Not on file  Food Insecurity: Not on file  Transportation Needs: Not on file  Physical Activity: Not on file  Stress: Not on file  Social Connections: Not on file  Intimate Partner Violence: Not on file    Outpatient Medications Prior to Visit  Medication Sig Dispense Refill   albuterol (PROVENTIL) (2.5 MG/3ML) 0.083% nebulizer solution Take 3 mLs (2.5 mg total) by nebulization every 6 (six) hours as needed for wheezing or shortness of breath. 150 mL 1   ALPRAZolam (XANAX) 1 MG tablet Take 1.5 tablets (1.5 mg total) by mouth daily as needed for anxiety. 30 tablet 0   aspirin EC 81 MG tablet Take 81 mg by mouth daily.     [employer] D3 25 MCG (1000 UT) capsule TAKE 2 SOFTGELS (2,000 UNITS TOTAL) BY MOUTH DAILY. 180 capsule 2   DULERA 200-5  MCG/ACT AERO INHALE 2 PUFFS BY MOUTH INTO THE LUNGS DAILY 13 each 2   folic acid (FOLVITE) 1 MG tablet TAKE 1 TABLET BY MOUTH EVERY DAY 90 tablet 2   gabapentin (NEURONTIN) 300 MG capsule TAKE 1 CAPSULE (300 MG TOTAL) BY MOUTH 3 (THREE) TIMES DAILY. (NEEDS TO BE SEEN BEFORE NEXT REFILL) 90 capsule 0   HYDROcodone-acetaminophen (NORCO) 10-325 MG tablet Take 1 tablet by mouth every 6 (six) hours as needed for pain.      megestrol (MEGACE) 40 MG/ML suspension Take 20 mLs (800 mg total) by mouth daily. (NEEDS TO BE SEEN BEFORE NEXT REFILL) 600 mL 0   methocarbamol (ROBAXIN) 500 MG tablet Take 500 mg by mouth 3 (three) times daily.     polyethylene glycol powder (GLYCOLAX/MIRALAX) 17 GM/SCOOP powder Take 17 g by mouth 2 (two) times daily as needed. (NEEDS TO BE SEEN BEFORE NEXT REFILL) 116 g 0   rosuvastatin (CRESTOR) 5 MG tablet Take 1 tablet (5 mg total) by mouth daily. 90 tablet 3   No facility-administered medications prior to visit.    Allergies  Allergen Reactions   Bee Venom Anaphylaxis   Meperidine Hcl Other (See Comments)     Makes her feel strange   Sulfa Antibiotics Other (See Comments)    Joint pain   Doxycycline Swelling and Dermatitis    Burning, red skin when she takes it.    Review of Systems  Constitutional:  Negative for activity change, appetite change, chills, fatigue and fever.  HENT: Negative.    Eyes: Negative.   Respiratory:  Negative for cough, chest tightness and shortness of breath.   Cardiovascular:  Negative for chest pain, palpitations and leg swelling.  Gastrointestinal:  Positive for nausea (with severe pain). Negative for blood in stool, constipation, diarrhea and vomiting.  Endocrine: Negative.   Genitourinary:  Negative for dysuria, frequency and urgency.  Musculoskeletal:  Positive for arthralgias (right thumb). Negative for myalgias.  Skin: Negative.   Allergic/Immunologic: Negative.   Neurological:  Negative for dizziness and headaches.  Hematological: Negative.   Psychiatric/Behavioral:  Negative for confusion, hallucinations, sleep disturbance and suicidal ideas.   All other systems reviewed and are negative.     Objective:    Physical Exam Vitals and nursing note reviewed.  Constitutional:      General: She is not in acute distress.    Appearance: Normal appearance. She is well-developed and well-groomed. She is not ill-appearing, toxic-appearing or diaphoretic.  HENT:     Head: Normocephalic and atraumatic.     Jaw: There is normal jaw occlusion.     Right Ear: Hearing normal.     Left Ear: Hearing normal.     Nose: Nose normal.     Mouth/Throat:     Lips: Pink.     Mouth: Mucous membranes are moist.     Pharynx: Oropharynx is clear. Uvula midline.  Eyes:     General: Lids are normal.     Extraocular Movements: Extraocular movements intact.     Conjunctiva/sclera: Conjunctivae normal.     Pupils: Pupils are equal, round, and reactive to light.  Neck:     Thyroid: No thyroid mass, thyromegaly or thyroid tenderness.     Vascular: No carotid bruit or JVD.      Trachea: Trachea and phonation normal.  Cardiovascular:     Rate and Rhythm: Normal rate and regular rhythm.     Chest Wall: PMI is not displaced.     Pulses: Normal pulses.  Heart sounds: Normal heart sounds. No murmur heard.   No friction rub. No gallop.  Pulmonary:     Effort: Pulmonary effort is normal. No respiratory distress.     Breath sounds: Normal breath sounds. No wheezing.  Abdominal:     General: Bowel sounds are normal. There is no distension or abdominal bruit.     Palpations: Abdomen is soft. There is no hepatomegaly or splenomegaly.     Tenderness: There is no abdominal tenderness. There is no right CVA tenderness or left CVA tenderness.     Hernia: No hernia is present.  Musculoskeletal:        General: Tenderness (right proximal thumb) present.       Arms:     Cervical back: Normal range of motion and neck supple.     Right lower leg: No edema.     Left lower leg: No edema.  Lymphadenopathy:     Cervical: No cervical adenopathy.  Skin:    General: Skin is warm and dry.     Capillary Refill: Capillary refill takes less than 2 seconds.     Coloration: Skin is not cyanotic, jaundiced or pale.     Findings: No rash.  Neurological:     General: No focal deficit present.     Mental Status: She is alert and oriented to person, place, and time.     Cranial Nerves: Cranial nerves are intact.     Sensory: Sensation is intact.     Motor: Motor function is intact.     Coordination: Coordination is intact.     Gait: Gait is intact.     Deep Tendon Reflexes: Reflexes are normal and symmetric.  Psychiatric:        Attention and Perception: Attention and perception normal.        Mood and Affect: Mood and affect normal.        Speech: Speech normal.        Behavior: Behavior normal. Behavior is cooperative.        Thought Content: Thought content normal.        Cognition and Memory: Cognition and memory normal.        Judgment: Judgment normal.    BP 114/79    Pulse 64   Temp (!) 97.4 F (36.3 C) (Temporal)   Ht '5\' 3"'$  (1.6 m)   Wt 146 lb (66.2 kg)   SpO2 96%   BMI 25.86 kg/m  Wt Readings from Last 3 Encounters:  08/25/20 146 lb (66.2 kg)  02/19/20 152 lb 9.6 oz (69.2 kg)  01/20/20 143 lb 6.4 oz (65 kg)    Health Maintenance Due  Topic Date Due   Pneumococcal Vaccine 28-63 Years old (1 - PCV) Never done   Zoster Vaccines- Shingrix (1 of 2) Never done   MAMMOGRAM  04/22/2014   PAP SMEAR-Modifier  04/15/2015   COVID-19 Vaccine (3 - Moderna risk series) 06/15/2019   COLONOSCOPY (Pts 45-70yr Insurance coverage will need to be confirmed)  03/22/2020   INFLUENZA VACCINE  08/22/2020       Assessment & Plan:   Problem List Items Addressed This Visit   None Visit Diagnoses     Trigger finger of right thumb    -  Primary Discussed treatment options with pt including trigger point injection. Pt declined injection today. Thumb spica splint applied in office with some relief of symptoms. Symptomatic care discussed in detail. NSAIDs as prescribed.   Relevant Medications   naproxen (NAPROSYN) 500  MG tablet   Nausea     Pt complains of nausea with severe pain. Will provide as needed Zofran. Pt aware to report any new or worsening symptoms.    Relevant Medications   ondansetron (ZOFRAN) 4 MG tablet        Meds ordered this encounter  Medications   naproxen (NAPROSYN) 500 MG tablet    Sig: Take 1 tablet (500 mg total) by mouth 2 (two) times daily with a meal for 14 days.    Dispense:  28 tablet    Refill:  0    Order Specific Question:   Supervising Provider    Answer:   [name] [[id number]]   ondansetron (ZOFRAN) 4 MG tablet    Sig: Take 1 tablet (4 mg total) by mouth every 8 (eight) hours as needed for nausea or vomiting.    Dispense:  20 tablet    Refill:  0    Order Specific Question:   Supervising Provider    Answer:   [name] [phone]    The above assessment and management plan was discussed with the patient. The  patient verbalized understanding of and has agreed to the management plan. Patient is aware to call the clinic if they develop any new symptoms or if symptoms fail to improve or worsen. Patient is aware when to return to the clinic for a follow-up visit. Patient educated on when it is appropriate to go to the emergency department.   Monia Pouch, FNP-C [facility] [address] [place], [place] [address] [phone]

## 2020-08-29 DIAGNOSIS — M5416 Radiculopathy, lumbar region: Secondary | ICD-10-CM | POA: Diagnosis not present

## 2020-08-29 DIAGNOSIS — M546 Pain in thoracic spine: Secondary | ICD-10-CM | POA: Diagnosis not present

## 2020-09-07 DIAGNOSIS — D72829 Elevated white blood cell count, unspecified: Secondary | ICD-10-CM | POA: Diagnosis not present

## 2020-09-11 DIAGNOSIS — U071 COVID-19: Secondary | ICD-10-CM | POA: Diagnosis not present

## 2020-09-19 DIAGNOSIS — R5381 Other malaise: Secondary | ICD-10-CM | POA: Diagnosis not present

## 2020-09-19 DIAGNOSIS — Z03818 Encounter for observation for suspected exposure to other biological agents ruled out: Secondary | ICD-10-CM | POA: Diagnosis not present

## 2020-09-28 ENCOUNTER — Telehealth: Payer: Self-pay | Admitting: Family

## 2020-09-28 DIAGNOSIS — M65311 Trigger thumb, right thumb: Secondary | ICD-10-CM

## 2020-09-28 NOTE — Telephone Encounter (Signed)
Pt called stating that she had an appt on 8/4 regarding her trigger finger. Says it has only gotten worse and pt wants to see a hand specialist asap.  Please advise and call patient.

## 2020-09-28 NOTE — Telephone Encounter (Signed)
[name],  [place] seen [place] on 8/4. She will not return to the office until Tuesday next week. Are you ok to complete referral?

## 2020-09-29 NOTE — Telephone Encounter (Signed)
Referral placed.

## 2020-10-04 ENCOUNTER — Ambulatory Visit: Payer: Medicaid Other | Admitting: Orthopedic Surgery

## 2020-10-06 ENCOUNTER — Ambulatory Visit: Payer: Medicaid Other | Admitting: Orthopedic Surgery

## 2020-10-10 DIAGNOSIS — D72829 Elevated white blood cell count, unspecified: Secondary | ICD-10-CM | POA: Diagnosis not present

## 2020-10-11 ENCOUNTER — Encounter: Payer: Self-pay | Admitting: Orthopedic Surgery

## 2020-10-11 ENCOUNTER — Ambulatory Visit (INDEPENDENT_AMBULATORY_CARE_PROVIDER_SITE_OTHER): Payer: Medicaid Other | Admitting: Orthopedic Surgery

## 2020-10-11 DIAGNOSIS — M65311 Trigger thumb, right thumb: Secondary | ICD-10-CM | POA: Diagnosis not present

## 2020-10-11 NOTE — Progress Notes (Signed)
Office Visit Note   Patient: [name]           Date of Birth: August 27, 1960           MRN: [id number] Visit Date: 10/11/2020              Requested by: [name], [address] [place],  [place] [address] PCP: [name], FNP   Assessment & Plan: Visit Diagnoses:  1. Trigger thumb, right thumb     Plan: We discussed the diagnosis, prognosis, and both conservative and operative treatment options for trigger thumb.  After our discussion, the patient has elected to proceed with A1 pulley release.  We reviewed the benefits of surgery and the potential risks including, but not limited to, persistent symptoms, infection, damage to nearby nerves and blood vessels, delayed wound healing.    All patient concerns and questions were addressed.  A surgical date will be confirmed with the patient.    Follow-Up Instructions: No follow-ups on file.   Orders:  No orders of the defined types were placed in this encounter.  No orders of the defined types were placed in this encounter.     Procedures: No procedures performed   Clinical Data: No additional findings.   Subjective: Chief Complaint  Patient presents with   Right Thumb - Pain    Locks when it bends, painful to unlock, RIGHT hand Dom, = swelling, weakness, burning, - N/t    Left Thumb - Pain    Starting to do samething as Right thumb    This is a 60 yo RHD F who presents w/ right thumb pain.  Her pain is 10/10 at worst. This started approx 3 months ago.  Initially, her thumb would pop and lock with flexion at the IP joint.  At this point, she's unable to actively flex at the IP joint.  She has pain at the A1 pulley.  She also describes early symptoms involving the right index finger and left thumb.  These are not locking but are painful over the A1 pulley and feel similar to how the right thumb started.    Review of Systems  Constitutional: Negative.   Respiratory: Negative.     Cardiovascular: Negative.   Skin: Negative.   Neurological: Negative.     Objective: Vital Signs: BP 111/77 (BP Location: Left Arm, Patient Position: Sitting)   Pulse 61   Ht 5\' 3"  (1.6 m)   Wt 146 lb (66.2 kg)   BMI 25.86 kg/m   Physical Exam Constitutional:      Appearance: Normal appearance.  Cardiovascular:     Rate and Rhythm: Normal rate.     Pulses: Normal pulses.  Pulmonary:     Effort: Pulmonary effort is normal.  Skin:    General: Skin is warm and dry.     Capillary Refill: Capillary refill takes less than 2 seconds.  Neurological:     Mental Status: She is alert.    Right Hand Exam   Tenderness  Right hand tenderness location: TTP over thumb and index finger A1 pulleys.  Range of Motion  The patient has normal right wrist ROM.   Muscle Strength  The patient has normal right wrist strength.  Other  Erythema: absent Sensation: normal Pulse: present  Comments:  No active flexion at thumb IP joint, palpable nodule at thumb A1 pulley, palpable nodule at index finger A1 pulley   Left Hand Exam   Comments:  TTP over  nodule over thumb A1 pulley, no locking or catching     Specialty Comments:  No specialty comments available.  Imaging: No results found.   PMFS History: Patient Active Problem List   Diagnosis Date Noted   Trigger thumb, right thumb 10/11/2020   Cough 10/19/2019   Vitamin D deficiency 07/20/2019   Benzodiazepine dependence ([place]) 11/29/2017   Controlled substance agreement signed 11/29/2017   Aortic atherosclerosis ([place]) 03/28/2017   COPD (chronic obstructive pulmonary disease) ([place]) 12/10/2016   HSV-1 infection    Tobacco user    Anxiety    Depression    Hyperlipidemia    Sciatica    Back pain    Right leg pain    Hip pain    Postmenopausal atrophic vaginitis 04/15/2012   Pelvic pain in female 04/15/2012   HEMORRHAGE OF RECTUM AND ANUS 06/16/2009   Past Medical History:  Diagnosis Date   Anxiety    Arthritis     Back pain    Depression    Hip pain    HSV-1 infection    Hyperlipidemia    Right leg pain    Sciatica    Tobacco user     Family History  Problem Relation Age of Onset   Brain cancer Mother    Cancer - Other Father        Abdominal    Colon cancer Sister    Colon cancer Paternal Grandmother     Past Surgical History:  Procedure Laterality Date   BUNIONECTOMY     CATARACT EXTRACTION     CESAREAN SECTION     COLONOSCOPY W/ POLYPECTOMY     ELBOW [place] / CHEEK / CHIN     TONSILLECTOMY     TUBAL LIGATION     Social History   Occupational History   Occupation: disabled  Tobacco Use   Smoking status: Every Day    Packs/day: 1.00    Types: Cigarettes    Start date: 01/23/1976   Smokeless tobacco: Never  Vaping Use   Vaping Use: Never used  Substance and Sexual Activity   Alcohol use: No   Drug use: No   Sexual activity: Yes

## 2020-10-11 NOTE — H&P (View-Only) (Signed)
Office Visit Note   Patient: [name]           Date of Birth: December 28, 1960           MRN: [id number] Visit Date: 10/11/2020              Requested by: [name], [address] [place],  [place] [address] PCP: [name], FNP   Assessment & Plan: Visit Diagnoses:  1. Trigger thumb, right thumb     Plan: We discussed the diagnosis, prognosis, and both conservative and operative treatment options for trigger thumb.  After our discussion, the patient has elected to proceed with A1 pulley release.  We reviewed the benefits of surgery and the potential risks including, but not limited to, persistent symptoms, infection, damage to nearby nerves and blood vessels, delayed wound healing.    All patient concerns and questions were addressed.  A surgical date will be confirmed with the patient.    Follow-Up Instructions: No follow-ups on file.   Orders:  No orders of the defined types were placed in this encounter.  No orders of the defined types were placed in this encounter.     Procedures: No procedures performed   Clinical Data: No additional findings.   Subjective: Chief Complaint  Patient presents with   Right Thumb - Pain    Locks when it bends, painful to unlock, RIGHT hand Dom, = swelling, weakness, burning, - N/t    Left Thumb - Pain    Starting to do samething as Right thumb    This is a 60 yo RHD F who presents w/ right thumb pain.  Her pain is 10/10 at worst. This started approx 3 months ago.  Initially, her thumb would pop and lock with flexion at the IP joint.  At this point, she's unable to actively flex at the IP joint.  She has pain at the A1 pulley.  She also describes early symptoms involving the right index finger and left thumb.  These are not locking but are painful over the A1 pulley and feel similar to how the right thumb started.    Review of Systems  Constitutional: Negative.   Respiratory: Negative.     Cardiovascular: Negative.   Skin: Negative.   Neurological: Negative.     Objective: Vital Signs: BP 111/77 (BP Location: Left Arm, Patient Position: Sitting)   Pulse 61   Ht 5\' 3"  (1.6 m)   Wt 146 lb (66.2 kg)   BMI 25.86 kg/m   Physical Exam Constitutional:      Appearance: Normal appearance.  Cardiovascular:     Rate and Rhythm: Normal rate.     Pulses: Normal pulses.  Pulmonary:     Effort: Pulmonary effort is normal.  Skin:    General: Skin is warm and dry.     Capillary Refill: Capillary refill takes less than 2 seconds.  Neurological:     Mental Status: She is alert.    Right Hand Exam   Tenderness  Right hand tenderness location: TTP over thumb and index finger A1 pulleys.  Range of Motion  The patient has normal right wrist ROM.   Muscle Strength  The patient has normal right wrist strength.  Other  Erythema: absent Sensation: normal Pulse: present  Comments:  No active flexion at thumb IP joint, palpable nodule at thumb A1 pulley, palpable nodule at index finger A1 pulley   Left Hand Exam   Comments:  TTP over  nodule over thumb A1 pulley, no locking or catching     Specialty Comments:  No specialty comments available.  Imaging: No results found.   PMFS History: Patient Active Problem List   Diagnosis Date Noted   Trigger thumb, right thumb 10/11/2020   Cough 10/19/2019   Vitamin D deficiency 07/20/2019   Benzodiazepine dependence ([place]) 11/29/2017   Controlled substance agreement signed 11/29/2017   Aortic atherosclerosis ([place]) 03/28/2017   COPD (chronic obstructive pulmonary disease) ([place]) 12/10/2016   HSV-1 infection    Tobacco user    Anxiety    Depression    Hyperlipidemia    Sciatica    Back pain    Right leg pain    Hip pain    Postmenopausal atrophic vaginitis 04/15/2012   Pelvic pain in female 04/15/2012   HEMORRHAGE OF RECTUM AND ANUS 06/16/2009   Past Medical History:  Diagnosis Date   Anxiety    Arthritis     Back pain    Depression    Hip pain    HSV-1 infection    Hyperlipidemia    Right leg pain    Sciatica    Tobacco user     Family History  Problem Relation Age of Onset   Brain cancer Mother    Cancer - Other Father        Abdominal    Colon cancer Sister    Colon cancer Paternal Grandmother     Past Surgical History:  Procedure Laterality Date   BUNIONECTOMY     CATARACT EXTRACTION     CESAREAN SECTION     COLONOSCOPY W/ POLYPECTOMY     ELBOW [place] / CHEEK / CHIN     TONSILLECTOMY     TUBAL LIGATION     Social History   Occupational History   Occupation: disabled  Tobacco Use   Smoking status: Every Day    Packs/day: 1.00    Types: Cigarettes    Start date: 01/23/1976   Smokeless tobacco: Never  Vaping Use   Vaping Use: Never used  Substance and Sexual Activity   Alcohol use: No   Drug use: No   Sexual activity: Yes

## 2020-10-19 ENCOUNTER — Encounter (HOSPITAL_BASED_OUTPATIENT_CLINIC_OR_DEPARTMENT_OTHER): Payer: Self-pay | Admitting: Orthopedic Surgery

## 2020-10-19 ENCOUNTER — Other Ambulatory Visit: Payer: Self-pay

## 2020-10-24 ENCOUNTER — Encounter (HOSPITAL_BASED_OUTPATIENT_CLINIC_OR_DEPARTMENT_OTHER): Payer: Self-pay | Admitting: Orthopedic Surgery

## 2020-10-24 ENCOUNTER — Ambulatory Visit (HOSPITAL_BASED_OUTPATIENT_CLINIC_OR_DEPARTMENT_OTHER): Payer: Medicaid Other | Admitting: Certified Registered"

## 2020-10-24 ENCOUNTER — Other Ambulatory Visit: Payer: Self-pay

## 2020-10-24 ENCOUNTER — Ambulatory Visit (HOSPITAL_BASED_OUTPATIENT_CLINIC_OR_DEPARTMENT_OTHER)
Admission: RE | Admit: 2020-10-24 | Discharge: 2020-10-24 | Disposition: A | Discharge status: Home / Self Care | Payer: Medicaid Other | Source: Ambulatory Visit | Attending: Orthopedic Surgery | Admitting: Orthopedic Surgery

## 2020-10-24 ENCOUNTER — Encounter (HOSPITAL_BASED_OUTPATIENT_CLINIC_OR_DEPARTMENT_OTHER)
Admission: RE | Disposition: A | Discharge status: Home / Self Care | Payer: Self-pay | Source: Ambulatory Visit | Attending: Orthopedic Surgery

## 2020-10-24 DIAGNOSIS — M65311 Trigger thumb, right thumb: Secondary | ICD-10-CM | POA: Diagnosis not present

## 2020-10-24 DIAGNOSIS — E785 Hyperlipidemia, unspecified: Secondary | ICD-10-CM | POA: Diagnosis not present

## 2020-10-24 DIAGNOSIS — F17200 Nicotine dependence, unspecified, uncomplicated: Secondary | ICD-10-CM | POA: Diagnosis not present

## 2020-10-24 DIAGNOSIS — E559 Vitamin D deficiency, unspecified: Secondary | ICD-10-CM | POA: Diagnosis not present

## 2020-10-24 DIAGNOSIS — F418 Other specified anxiety disorders: Secondary | ICD-10-CM | POA: Diagnosis not present

## 2020-10-24 HISTORY — DX: Dyspnea, unspecified: R06.00

## 2020-10-24 HISTORY — PX: TRIGGER FINGER RELEASE: SHX641

## 2020-10-24 HISTORY — DX: Chronic obstructive pulmonary disease, unspecified: J44.9

## 2020-10-24 SURGERY — RELEASE, A1 PULLEY, FOR TRIGGER FINGER
Anesthesia: Monitor Anesthesia Care | Site: Thumb | Laterality: Right

## 2020-10-24 MED ORDER — MIDAZOLAM HCL 2 MG/2ML IJ SOLN
INTRAMUSCULAR | Status: AC
  Filled 2020-10-24: qty 2

## 2020-10-24 MED ORDER — PROMETHAZINE HCL 25 MG/ML IJ SOLN: 6.2500 mg | INTRAMUSCULAR | Status: DC | PRN

## 2020-10-24 MED ORDER — HYDROMORPHONE HCL 1 MG/ML IJ SOLN: 0.2500 mg | INTRAMUSCULAR | Status: DC | PRN

## 2020-10-24 MED ORDER — LIDOCAINE-EPINEPHRINE 1 %-1:100000 IJ SOLN
INTRAMUSCULAR | Status: AC
  Filled 2020-10-24: qty 1

## 2020-10-24 MED ORDER — OXYCODONE HCL 5 MG/5ML PO SOLN: 5.0000 mg | Freq: Once | ORAL | Status: DC | PRN

## 2020-10-24 MED ORDER — CEFAZOLIN SODIUM-DEXTROSE 2-4 GM/100ML-% IV SOLN
2.0000 g | INTRAVENOUS | Status: AC
  Administered 2020-10-24: 2 g via INTRAVENOUS

## 2020-10-24 MED ORDER — BUPIVACAINE HCL (PF) 0.25 % IJ SOLN
INTRAMUSCULAR | Status: AC
  Filled 2020-10-24: qty 30

## 2020-10-24 MED ORDER — ONDANSETRON HCL 4 MG/2ML IJ SOLN
INTRAMUSCULAR | Status: DC | PRN
  Administered 2020-10-24: 4 mg via INTRAVENOUS

## 2020-10-24 MED ORDER — LACTATED RINGERS IV SOLN: INTRAVENOUS | Status: DC

## 2020-10-24 MED ORDER — CEFAZOLIN SODIUM-DEXTROSE 2-4 GM/100ML-% IV SOLN
INTRAVENOUS | Status: AC
  Filled 2020-10-24: qty 100

## 2020-10-24 MED ORDER — MIDAZOLAM HCL 5 MG/5ML IJ SOLN
INTRAMUSCULAR | Status: DC | PRN
  Administered 2020-10-24: 2 mg via INTRAVENOUS

## 2020-10-24 MED ORDER — FENTANYL CITRATE (PF) 100 MCG/2ML IJ SOLN
INTRAMUSCULAR | Status: DC | PRN
  Administered 2020-10-24 (×2): 50 ug via INTRAVENOUS

## 2020-10-24 MED ORDER — PROPOFOL 500 MG/50ML IV EMUL
INTRAVENOUS | Status: AC
  Filled 2020-10-24: qty 50

## 2020-10-24 MED ORDER — LIDOCAINE HCL 1 % IJ SOLN
INTRAMUSCULAR | Status: DC | PRN
  Administered 2020-10-24: 5 mL via INTRAMUSCULAR

## 2020-10-24 MED ORDER — ONDANSETRON HCL 4 MG/2ML IJ SOLN
INTRAMUSCULAR | Status: AC
  Filled 2020-10-24: qty 2

## 2020-10-24 MED ORDER — FENTANYL CITRATE (PF) 100 MCG/2ML IJ SOLN
INTRAMUSCULAR | Status: AC
  Filled 2020-10-24: qty 2

## 2020-10-24 MED ORDER — 0.9 % SODIUM CHLORIDE (POUR BTL) OPTIME
TOPICAL | Status: DC | PRN
  Administered 2020-10-24: 120 mL

## 2020-10-24 MED ORDER — LIDOCAINE HCL (PF) 1 % IJ SOLN
INTRAMUSCULAR | Status: AC
  Filled 2020-10-24: qty 30

## 2020-10-24 MED ORDER — PROPOFOL 10 MG/ML IV BOLUS
INTRAVENOUS | Status: DC | PRN
  Administered 2020-10-24 (×2): 50 mg via INTRAVENOUS

## 2020-10-24 MED ORDER — OXYCODONE HCL 5 MG PO TABS: 5.0000 mg | ORAL_TABLET | Freq: Once | ORAL | Status: DC | PRN

## 2020-10-24 SURGICAL SUPPLY — 30 items
APL PRP STRL LF DISP 70% ISPRP (MISCELLANEOUS) ×1
BLADE SURG 15 STRL LF DISP TIS (BLADE) ×1 IMPLANT
BLADE SURG 15 STRL SS (BLADE) ×2
BNDG CMPR 9X4 STRL LF SNTH (GAUZE/BANDAGES/DRESSINGS) ×1
BNDG ELASTIC 3X5.8 VLCR STR LF (GAUZE/BANDAGES/DRESSINGS) ×2 IMPLANT
BNDG ESMARK 4X9 LF (GAUZE/BANDAGES/DRESSINGS) ×2 IMPLANT
BNDG GAUZE ELAST 4 BULKY (GAUZE/BANDAGES/DRESSINGS) ×2 IMPLANT
CHLORAPREP W/TINT 26 (MISCELLANEOUS) ×2 IMPLANT
CORD BIPOLAR FORCEPS 12FT (ELECTRODE) ×2 IMPLANT
COVER BACK TABLE 60X90IN (DRAPES) ×2 IMPLANT
COVER MAYO STAND STRL (DRAPES) ×2 IMPLANT
DRAPE EXTREMITY T 121X128X90 (DISPOSABLE) ×2 IMPLANT
DRAPE SURG 17X23 STRL (DRAPES) ×2 IMPLANT
GAUZE SPONGE 4X4 12PLY STRL (GAUZE/BANDAGES/DRESSINGS) ×2 IMPLANT
GAUZE XEROFORM 1X8 LF (GAUZE/BANDAGES/DRESSINGS) ×1 IMPLANT
GLOVE SURG ENC MOIS LTX SZ7 (GLOVE) ×2 IMPLANT
GLOVE SURG UNDER POLY LF SZ7 (GLOVE) ×2 IMPLANT
GOWN STRL REUS W/ TWL LRG LVL3 (GOWN DISPOSABLE) ×1 IMPLANT
GOWN STRL REUS W/ TWL XL LVL3 (GOWN DISPOSABLE) IMPLANT
GOWN STRL REUS W/TWL LRG LVL3 (GOWN DISPOSABLE) ×4
GOWN STRL REUS W/TWL XL LVL3 (GOWN DISPOSABLE) ×2
NDL HYPO 25X1 1.5 SAFETY (NEEDLE) IMPLANT
NEEDLE HYPO 25X1 1.5 SAFETY (NEEDLE) ×2 IMPLANT
NS IRRIG 1000ML POUR BTL (IV SOLUTION) ×2 IMPLANT
PACK BASIN DAY SURGERY FS (CUSTOM PROCEDURE TRAY) ×2 IMPLANT
SUT ETHILON 4 0 PS 2 18 (SUTURE) ×2 IMPLANT
SYR BULB EAR ULCER 3OZ GRN STR (SYRINGE) ×2 IMPLANT
SYR CONTROL 10ML LL (SYRINGE) ×1 IMPLANT
TOWEL GREEN STERILE FF (TOWEL DISPOSABLE) ×4 IMPLANT
UNDERPAD 30X36 HEAVY ABSORB (UNDERPADS AND DIAPERS) ×2 IMPLANT

## 2020-10-24 NOTE — Interval H&P Note (Signed)
History and Physical Interval Note:  10/24/2020 12:15 PM  JAENA BROCATO  has presented today for surgery, with the diagnosis of Right Trigger Thumb.  The various methods of treatment have been discussed with the patient and family. After consideration of risks, benefits and other options for treatment, the patient has consented to release RIGHT trigger thumb as a surgical intervention.  The patient's history has been reviewed, patient examined, no change in status, stable for surgery.  I have reviewed the patient's chart and labs.  Questions were answered to the patient's satisfaction.     Nesa Distel Georgina Krist

## 2020-10-24 NOTE — Anesthesia Postprocedure Evaluation (Signed)
Anesthesia Post Note  Patient: [name]  Procedure(s) Performed: RELEASE TRIGGER RIGHT THUMB (Right: Thumb)     Patient location during evaluation: PACU Anesthesia Type: MAC Level of consciousness: awake and alert Pain management: pain level controlled Vital Signs Assessment: post-procedure vital signs reviewed and stable Respiratory status: spontaneous breathing, nonlabored ventilation and respiratory function stable Cardiovascular status: blood pressure returned to baseline and stable Postop Assessment: no apparent nausea or vomiting Anesthetic complications: no   No notable events documented.  Last Vitals:  Vitals:   10/24/20 1315 10/24/20 1330  BP: (!) 113/99 116/69  Pulse: (!) 46 (!) 49  Resp: 15 16  Temp:  36.7 C  SpO2: 96% 94%    Last Pain:  Vitals:   10/24/20 1330  TempSrc:   PainSc: 0-No pain                 Lynda Rainwater

## 2020-10-24 NOTE — Discharge Instructions (Signed)
[name], M.D. Hand Surgery  POST-OPERATIVE DISCHARGE INSTRUCTIONS   PRESCRIPTIONS: You have been given a prescription to be taken as directed for post-operative pain control.  You may also take over the counter ibuprofen/aleve and tylenol for pain. Take this as directed on the packaging. Do not exceed 3000 mg tylenol/acetaminophen in 24 hours.  Ibuprofen 600-800 mg (3-4) tablets by mouth every 6 hours as needed for pain.  OR Aleve 2 tablets by mouth every 12 hours (twice daily) as needed for pain.  AND/OR Tylenol 1000 mg (2 tablets) every 8 hours as needed for pain.  Please use your pain medication carefully, as refills are limited and you may not be provided with one.  As stated above, please use over the counter pain medicine - it will also be helpful with decreasing your swelling.    ANESTHESIA: After your surgery, post-surgical discomfort or pain is likely. This discomfort can last several days to a few weeks. At certain times of the day your discomfort may be more intense.   Did you receive a nerve block?  A nerve block can provide pain relief for one hour to two days after your surgery. As long as the nerve block is working, you will experience little or no sensation in the area the surgeon operated on.  As the nerve block wears off, you will begin to experience pain or discomfort. It is very important that you begin taking your prescribed pain medication before the nerve block fully wears off. Treating your pain at the first sign of the block wearing off will ensure your pain is better controlled and more tolerable when full-sensation returns. Do not wait until the pain is intolerable, as the medicine will be less effective. It is better to treat pain in advance than to try and catch up.   General Anesthesia:  If you did not receive a nerve block during your surgery, you will need to start taking your pain medication shortly after your surgery and should continue to  do so as prescribed by your surgeon.     ICE AND ELEVATION: You may use ice for the first 48-72 hours, but it is not critical.   Motion of your fingers is very important s to decrease the swelling. Please follow the finger range of motion exercises below to assist you in regaining your motion. This should be done at least 10 repetitions 3-4 times a day.  Elevation, as much as possible for the next 48 hours, is critical for decreasing swelling as well as for pain relief. Elevation means when you are seated or lying down, you hand should be at or above your heart. When walking, the hand needs to be at or above the level of your elbow.  If the bandage gets too tight, it may need to be loosened. Please contact our office and we will instruct you in how to do this.    SURGICAL BANDAGES:  Keep your dressing and/or splint clean and dry at all times.  You can remove your dressing 4 days from now and change with a dry dressing or Band-Aids as needed thereafter. You may place a plastic bag over your bandage to shower, but be careful, do not get your bandages wet.  After the bandages have been removed, it is OK to get the stitches wet in a shower or with hand washing. Do Not soak or submerge the wound yet. Please do not use lotions or creams on the stitches.  HAND THERAPY:  You may not need any. If you do, we will begin this at your follow up visit in the clinic.    ACTIVITY AND WORK: You are encouraged to move any fingers which are not in the bandage. Light use of the fingers is allowed to assist the other hand with daily hygiene and eating, but strong gripping or lifting is often uncomfortable and should be avoided.  You might miss a variable period of time from work and hopefully this issue has been discussed prior to surgery. You may not do any heavy work with your affected hand for about 2 weeks.    [facility] [address] [place],  [place]  [address] [phone]

## 2020-10-24 NOTE — Op Note (Signed)
   Date of Surgery: 10/24/2020  INDICATIONS: Ms. Panik is a 60 y.o.-year-old female with a right trigger thumb.  Risks, benefits, and alternatives to surgery were discussed.  The Patient did consent to the procedure after extensive discussion.   PREOPERATIVE DIAGNOSIS: Right trigger thumb  POSTOPERATIVE DIAGNOSIS: Same.  PROCEDURE: Right trigger thumb release   SURGEON: Audria Nine, M.D.  ASSIST:   ANESTHESIA:  Local, MAC  IV FLUIDS AND URINE: See anesthesia.  ESTIMATED BLOOD LOSS: 2 mL.  IMPLANTS: * No implants in log *   DRAINS: None  COMPLICATIONS: see description of procedure.  DESCRIPTION OF PROCEDURE: The patient was met in the preoperative holding area where the surgical site was marked and the consent form was verified.  The patient was then taken to the operating room and transferred to the operating table.  A tourniquet was applied to the right forearm.  5cc of 1% plain lidocaine and 0.25% marcaine was injected just proximal to the A1 pulley for a local block.  The operative extremity was prepped and draped in the usual and sterile fashion.  A formal time-out was performed to confirm that this was the correct patient, surgery, side, and site.   Following timeout, the tourniquet was inflated to 250 mmHg.  A transverse incision was made over the A1 pulley along an existing flexor crease.  The skin was sharply incised.  Blunt dissection was used to identify the A1 pulley.  Two retractors were used to retract the radial and ulnar digital nerves.  The A1 pulley was initially opened using a #15 blade followed by a tenotomy scissor.  Care was taken to protect the digital nerves during release of the pulley.  Following complete pulley release, the patient was reversed from sedation and asked to fully flex her thumb.  She was able to fully flex at the thumb IP joint which she was unable to do preoperatively.  Satisfied with the pulley release, the wound was thoroughly irrigated  and then closed using 4-0 nylon suture in a horizontal mattress fashion.  The wound was dressed with xeroform, 4x4, and an ACE wrap.  All counts were correct x 2 at the end of the procedure.  The drapes were taken down and the patient was then transferred to the postoperative bed in stable condition.    POSTOPERATIVE PLAN: The patient will be discharged to home from PACU.  They were given appropriate printed postoperative instructions.  Audria Nine, MD 12:57 PM

## 2020-10-24 NOTE — Transfer of Care (Signed)
Immediate Anesthesia Transfer of Care Note  Patient: LILIANE MALLIS  Procedure(s) Performed: RELEASE TRIGGER RIGHT THUMB (Right: Thumb)  Patient Location: PACU  Anesthesia Type:MAC  Level of Consciousness: awake  Airway & Oxygen Therapy: Patient Spontanous Breathing and Patient connected to face mask oxygen  Post-op Assessment: Report given to RN and Post -op Vital signs reviewed and stable  Post vital signs: Reviewed and stable  Last Vitals:  Vitals Value Taken Time  BP    Temp    Pulse 47 10/24/20 1256  Resp    SpO2 100 % 10/24/20 1256  Vitals shown include unvalidated device data.  Last Pain:  Vitals:   10/24/20 1133  TempSrc: Oral  PainSc: 2       Patients Stated Pain Goal: 2 (18/86/77 3736)  Complications: No notable events documented.

## 2020-10-24 NOTE — Brief Op Note (Signed)
10/24/2020  12:56 PM  PATIENT:  [name]  60 y.o. female  PRE-OPERATIVE DIAGNOSIS:  Right Trigger Thumb  POST-OPERATIVE DIAGNOSIS:  Right Trigger Thumb  PROCEDURE:  Procedure(s): RELEASE TRIGGER RIGHT THUMB (Right)  SURGEON:  Surgeon(s) and Role:    * Sherilyn Cooter, MD - Primary  PHYSICIAN ASSISTANT:   ASSISTANTS: none   ANESTHESIA:   MAC  EBL:  5 mL   BLOOD ADMINISTERED:none  DRAINS: none   LOCAL MEDICATIONS USED:  MARCAINE    and LIDOCAINE   SPECIMEN:  No Specimen  DISPOSITION OF SPECIMEN:  N/A  COUNTS:  YES  TOURNIQUET:   Total Tourniquet Time Documented: Forearm (Right) - 5 minutes Total: Forearm (Right) - 5 minutes   DICTATION: .Dragon Dictation  PLAN OF CARE: Discharge to home after PACU  PATIENT DISPOSITION:  PACU - hemodynamically stable.   Delay start of Pharmacological VTE agent (>24hrs) due to surgical blood loss or risk of bleeding: not applicable

## 2020-10-24 NOTE — Anesthesia Preprocedure Evaluation (Signed)
Anesthesia Evaluation  Patient identified by MRN, date of birth, ID band Patient awake    Reviewed: Allergy & Precautions, NPO status , Patient's Chart, lab work & pertinent test results  Airway Mallampati: II  TM Distance: >3 FB Neck ROM: Full    Dental no notable dental hx.    Pulmonary shortness of breath, COPD, Current Smoker and Patient abstained from smoking.,    Pulmonary exam normal breath sounds clear to auscultation       Cardiovascular negative cardio ROS Normal cardiovascular exam Rhythm:Regular Rate:Normal     Neuro/Psych Anxiety Depression negative neurological ROS  negative psych ROS   GI/Hepatic negative GI ROS, Neg liver ROS,   Endo/Other  negative endocrine ROS  Renal/GU negative Renal ROS  negative genitourinary   Musculoskeletal  (+) Arthritis , Osteoarthritis,    Abdominal   Peds negative pediatric ROS (+)  Hematology negative hematology ROS (+)   Anesthesia Other Findings   Reproductive/Obstetrics negative OB ROS                             Anesthesia Physical Anesthesia Plan  ASA: 3  Anesthesia Plan: MAC   Post-op Pain Management:    Induction: Intravenous  PONV Risk Score and Plan: 1 and Ondansetron and Treatment may vary due to age or medical condition  Airway Management Planned: Simple Face Mask  Additional Equipment:   Intra-op Plan:   Post-operative Plan:   Informed Consent: I have reviewed the patients History and Physical, chart, labs and discussed the procedure including the risks, benefits and alternatives for the proposed anesthesia with the patient or authorized representative who has indicated his/her understanding and acceptance.     Dental advisory given  Plan Discussed with: CRNA  Anesthesia Plan Comments:         Anesthesia Quick Evaluation

## 2020-10-25 ENCOUNTER — Encounter (HOSPITAL_BASED_OUTPATIENT_CLINIC_OR_DEPARTMENT_OTHER): Payer: Self-pay | Admitting: Orthopedic Surgery

## 2020-10-26 ENCOUNTER — Telehealth: Payer: Self-pay

## 2020-10-26 ENCOUNTER — Other Ambulatory Visit: Payer: Self-pay | Admitting: Orthopedic Surgery

## 2020-10-26 MED ORDER — OXYCODONE HCL 5 MG PO TABS: 5.0000 mg | ORAL_TABLET | Freq: Four times a day (QID) | ORAL | 0 refills | Status: AC | PRN

## 2020-10-26 NOTE — Telephone Encounter (Signed)
Patient called saying her hand is still swollen and very painful - 2 days post sx by Dr. Tempie Donning for trigger finger. She is elevating and icing the hand. Tried tylenol and aleve -- not touching the pain. She is asking for something for pain. [facility]. Cb # [phone].

## 2020-10-28 ENCOUNTER — Telehealth: Payer: Self-pay

## 2020-10-28 NOTE — Telephone Encounter (Signed)
Patient asking for a refill of pain medication

## 2020-11-01 ENCOUNTER — Other Ambulatory Visit (HOSPITAL_COMMUNITY): Payer: Self-pay

## 2020-11-01 ENCOUNTER — Encounter (HOSPITAL_COMMUNITY): Payer: Self-pay

## 2020-11-01 DIAGNOSIS — Z87891 Personal history of nicotine dependence: Secondary | ICD-10-CM

## 2020-11-01 DIAGNOSIS — Z122 Encounter for screening for malignant neoplasm of respiratory organs: Secondary | ICD-10-CM

## 2020-11-01 NOTE — Progress Notes (Signed)
Order placed for LDCT. Patient scheduled for 11/30/20 at 1100.

## 2020-11-07 ENCOUNTER — Encounter: Payer: Medicaid Other | Admitting: Orthopedic Surgery

## 2020-11-08 ENCOUNTER — Other Ambulatory Visit: Payer: Self-pay

## 2020-11-08 ENCOUNTER — Ambulatory Visit (INDEPENDENT_AMBULATORY_CARE_PROVIDER_SITE_OTHER): Payer: Medicaid Other | Admitting: Orthopedic Surgery

## 2020-11-08 DIAGNOSIS — M65311 Trigger thumb, right thumb: Secondary | ICD-10-CM

## 2020-11-08 MED ORDER — DICLOFENAC SODIUM 75 MG PO TBEC: 75.0000 mg | DELAYED_RELEASE_TABLET | Freq: Two times a day (BID) | ORAL | 0 refills | Status: AC

## 2020-11-08 NOTE — Progress Notes (Signed)
Post-Op Visit Note   Patient: [name]           Date of Birth: 02-Mar-1960           MRN: [id number] Visit Date: 11/08/2020 PCP: [name], FNP   Assessment & Plan:  Chief Complaint:  Chief Complaint  Patient presents with   Right Thumb - Routine Post Op    Trigger Finger Release on Right Thumb   Visit Diagnoses:  1. Trigger thumb, right thumb     Plan: She is doing well postoperatively.  She has expected post op swelling.  She is able to flex and extend the IP joint of the thumb which she was unable to do before.  She has no surrounding erythema or induration.  She also describes pain in the left thumb at the thumb [facility] joint with a positive [facility] grind test. I will prescribe her diclofenac for osteoarthritis pain.   Follow-Up Instructions: No follow-ups on file.   Orders:  No orders of the defined types were placed in this encounter.  No orders of the defined types were placed in this encounter.   Imaging: No results found.  PMFS History: Patient Active Problem List   Diagnosis Date Noted   Trigger thumb, right thumb 10/11/2020   Cough 10/19/2019   Vitamin D deficiency 07/20/2019   Benzodiazepine dependence ([place]) 11/29/2017   Controlled substance agreement signed 11/29/2017   Aortic atherosclerosis ([employer]) 03/28/2017   COPD (chronic obstructive pulmonary disease) ([place]) 12/10/2016   HSV-1 infection    Tobacco user    Anxiety    Depression    Hyperlipidemia    Sciatica    Back pain    Right leg pain    Hip pain    Postmenopausal atrophic vaginitis 04/15/2012   Pelvic pain in female 04/15/2012   HEMORRHAGE OF RECTUM AND ANUS 06/16/2009   Past Medical History:  Diagnosis Date   Anxiety    Arthritis    Back pain    COPD (chronic obstructive pulmonary disease) (HCC)    Depression    Dyspnea    Hip pain    HSV-1 infection    Hyperlipidemia    Right leg pain    Sciatica    Tobacco user     Family History  Problem Relation Age of Onset    Brain cancer Mother    Cancer - Other Father        Abdominal    Colon cancer Sister    Colon cancer Paternal Grandmother     Past Surgical History:  Procedure Laterality Date   BUNIONECTOMY     CATARACT EXTRACTION     CESAREAN SECTION     COLONOSCOPY W/ POLYPECTOMY     ELBOW SURGERY     [place] / CHEEK / CHIN     TONSILLECTOMY     TRIGGER FINGER RELEASE Right 10/24/2020   Procedure: RELEASE TRIGGER RIGHT THUMB;  Surgeon: Sherilyn Cooter, MD;  Location: [place];  Service: Orthopedics;  Laterality: Right;   TUBAL LIGATION     Social History   Occupational History   Occupation: disabled  Tobacco Use   Smoking status: Every Day    Packs/day: 1.00    Types: Cigarettes    Start date: 01/23/1976   Smokeless tobacco: Never  Vaping Use   Vaping Use: Never used  Substance and Sexual Activity   Alcohol use: No   Drug use: No  Sexual activity: Yes

## 2020-11-09 ENCOUNTER — Other Ambulatory Visit: Payer: Self-pay | Admitting: Family

## 2020-11-09 DIAGNOSIS — D72829 Elevated white blood cell count, unspecified: Secondary | ICD-10-CM | POA: Diagnosis not present

## 2020-11-09 DIAGNOSIS — R634 Abnormal weight loss: Secondary | ICD-10-CM

## 2020-11-09 DIAGNOSIS — R630 Anorexia: Secondary | ICD-10-CM

## 2020-11-30 ENCOUNTER — Ambulatory Visit (HOSPITAL_COMMUNITY): Admission: RE | Admit: 2020-11-30 | Payer: Medicaid Other | Source: Ambulatory Visit

## 2020-12-01 DIAGNOSIS — M546 Pain in thoracic spine: Secondary | ICD-10-CM | POA: Diagnosis not present

## 2020-12-01 DIAGNOSIS — M5416 Radiculopathy, lumbar region: Secondary | ICD-10-CM | POA: Diagnosis not present

## 2020-12-02 ENCOUNTER — Other Ambulatory Visit (HOSPITAL_COMMUNITY): Payer: Self-pay | Admitting: Hematology

## 2020-12-08 ENCOUNTER — Encounter: Payer: Self-pay | Admitting: Family

## 2020-12-08 ENCOUNTER — Other Ambulatory Visit: Payer: Self-pay

## 2020-12-08 ENCOUNTER — Ambulatory Visit: Payer: Medicaid Other | Admitting: Family

## 2020-12-08 VITALS — BP 129/83 | HR 58 | Temp 97.5°F | Ht 63.0 in | Wt 147.6 lb

## 2020-12-08 DIAGNOSIS — R351 Nocturia: Secondary | ICD-10-CM

## 2020-12-08 DIAGNOSIS — I700 Atherosclerosis of aorta: Secondary | ICD-10-CM | POA: Diagnosis not present

## 2020-12-08 DIAGNOSIS — J410 Simple chronic bronchitis: Secondary | ICD-10-CM

## 2020-12-08 DIAGNOSIS — E785 Hyperlipidemia, unspecified: Secondary | ICD-10-CM

## 2020-12-08 DIAGNOSIS — G4700 Insomnia, unspecified: Secondary | ICD-10-CM

## 2020-12-08 DIAGNOSIS — Z79899 Other long term (current) drug therapy: Secondary | ICD-10-CM | POA: Diagnosis not present

## 2020-12-08 DIAGNOSIS — F321 Major depressive disorder, single episode, moderate: Secondary | ICD-10-CM

## 2020-12-08 DIAGNOSIS — M5430 Sciatica, unspecified side: Secondary | ICD-10-CM

## 2020-12-08 DIAGNOSIS — R634 Abnormal weight loss: Secondary | ICD-10-CM

## 2020-12-08 DIAGNOSIS — F1320 Sedative, hypnotic or anxiolytic dependence, uncomplicated: Secondary | ICD-10-CM | POA: Diagnosis not present

## 2020-12-08 DIAGNOSIS — F419 Anxiety disorder, unspecified: Secondary | ICD-10-CM

## 2020-12-08 DIAGNOSIS — R630 Anorexia: Secondary | ICD-10-CM

## 2020-12-08 DIAGNOSIS — Z720 Tobacco use: Secondary | ICD-10-CM

## 2020-12-08 LAB — URINALYSIS, COMPLETE
Bilirubin, UA: NEGATIVE
Glucose, UA: NEGATIVE
Ketones, UA: NEGATIVE
Leukocytes,UA: NEGATIVE
Nitrite, UA: NEGATIVE
Protein,UA: NEGATIVE
Specific Gravity, UA: 1.01 (ref 1.005–1.030)
Urobilinogen, Ur: 0.2 mg/dL (ref 0.2–1.0)
pH, UA: 6 (ref 5.0–7.5)

## 2020-12-08 LAB — MICROSCOPIC EXAMINATION
Bacteria, UA: NONE SEEN
Epithelial Cells (non renal): NONE SEEN /hpf (ref 0–10)
RBC, Urine: NONE SEEN /hpf (ref 0–2)
Renal Epithel, UA: NONE SEEN /hpf
WBC, UA: NONE SEEN /hpf (ref 0–5)

## 2020-12-08 MED ORDER — GABAPENTIN 300 MG PO CAPS: 300.0000 mg | ORAL_CAPSULE | Freq: Three times a day (TID) | ORAL | 0 refills | Status: DC

## 2020-12-08 MED ORDER — TRETINOIN 0.05 % EX CREA
TOPICAL_CREAM | Freq: Every day | CUTANEOUS | 0 refills | Status: DC
Start: 2020-12-08 — End: 2020-12-20

## 2020-12-08 MED ORDER — MEGESTROL ACETATE 40 MG/ML PO SUSP: 800.0000 mg | Freq: Every day | ORAL | 0 refills | Status: DC

## 2020-12-08 MED ORDER — TRAZODONE HCL 100 MG PO TABS: 100.0000 mg | ORAL_TABLET | Freq: Every day | ORAL | 1 refills | Status: DC

## 2020-12-08 MED ORDER — ROSUVASTATIN CALCIUM 5 MG PO TABS: 5.0000 mg | ORAL_TABLET | Freq: Every day | ORAL | 3 refills | Status: DC

## 2020-12-08 NOTE — Addendum Note (Signed)
Addended by: Ladean Raya on: 12/08/2020 10:24 AM   Modules accepted: Orders

## 2020-12-08 NOTE — Progress Notes (Signed)
 Subjective:    Patient ID: [name], female    DOB: 06/22/1960, 60 y.o.   MRN: [id number]  Chief Complaint  Patient presents with   Insomnia   Pt presents to the office today chronic follow up. She continues to have increased anxiety. She has tried Lexapro, Cymbalta but can not tolerate it. She is upset that her was decreased to  Xanax 1.5 mg daily.  She wants referral to behavorial health.    She is followed by Pain Clinic 3 months for chronic back pain.     She is followed by Hematologists every 6 months for Leukocytosis.    She has COPD and is a 1/2 pack smoker for the last 30 years. She reports she has intermittent SOB.  She has aortic atherosclerosis and hyperlipidemia. She is prescribed Crestor, but does not take regularly.  Insomnia Primary symptoms: difficulty falling asleep, frequent awakening.   The current episode started more than one year. The onset quality is gradual. The problem occurs intermittently. PMH includes: depression.   Anxiety Presents for follow-up visit. Symptoms include excessive worry, insomnia, irritability, nervous/anxious behavior and restlessness. The severity of symptoms is moderate.    Hyperlipidemia This is a chronic problem. The current episode started more than 1 year ago. The problem is uncontrolled. Current antihyperlipidemic treatment includes statins. The current treatment provides moderate improvement of lipids.  Nicotine Dependence Presents for follow-up visit. Symptoms include insomnia and irritability. Her urge triggers include company of smokers. The symptoms have been stable. She smokes < 1/2 a pack of cigarettes per day.  Depression        This is a chronic problem.  The current episode started more than 1 year ago.   The onset quality is gradual.   Associated symptoms include helplessness, hopelessness, insomnia, irritable and restlessness.  Past medical history includes anxiety.      Review of Systems  Constitutional:   Positive for irritability.  Psychiatric/Behavioral:  Positive for depression. The patient is nervous/anxious and has insomnia.   All other systems reviewed and are negative.     Objective:   Physical Exam Vitals reviewed.  Constitutional:      General: She is irritable. She is not in acute distress.    Appearance: She is well-developed.  HENT:     Head: Normocephalic and atraumatic.     Right Ear: Tympanic membrane normal.     Left Ear: Tympanic membrane normal.  Eyes:     Pupils: Pupils are equal, round, and reactive to light.  Neck:     Thyroid: No thyromegaly.  Cardiovascular:     Rate and Rhythm: Normal rate and regular rhythm.     Heart sounds: Normal heart sounds. No murmur heard. Pulmonary:     Effort: Pulmonary effort is normal. No respiratory distress.     Breath sounds: Normal breath sounds. No wheezing.  Abdominal:     General: Bowel sounds are normal. There is no distension.     Palpations: Abdomen is soft.     Tenderness: There is no abdominal tenderness.  Musculoskeletal:        General: No tenderness. Normal range of motion.     Cervical back: Normal range of motion and neck supple.  Skin:    General: Skin is warm and dry.  Neurological:     Mental Status: She is alert and oriented to person, place, and time.     Cranial Nerves: No cranial nerve deficit.     Deep Tendon Reflexes:   Reflexes are normal and symmetric.  Psychiatric:        Behavior: Behavior normal.        Thought Content: Thought content normal.        Judgment: Judgment normal.     BP 129/83   Pulse (!) 58   Temp (!) 97.5 F (36.4 C) (Temporal)   Ht 5' 3" (1.6 m)   Wt 147 lb 9.6 oz (67 kg)   BMI 26.15 kg/m       Assessment & Plan:  Loan T Tat comes in today with chief complaint of Insomnia   Diagnosis and orders addressed:  1. Aortic atherosclerosis (HCC) - CMP14+EGFR - CBC with Differential/Platelet  2. Simple chronic bronchitis (HCC) - CMP14+EGFR - CBC with  Differential/Platelet  3. Benzodiazepine dependence (HCC) - CMP14+EGFR - CBC with Differential/Platelet  4. Controlled substance agreement signed - CMP14+EGFR - CBC with Differential/Platelet  5. Tobacco user - CMP14+EGFR - CBC with Differential/Platelet  6. Hyperlipidemia, unspecified hyperlipidemia type - CMP14+EGFR - CBC with Differential/Platelet - rosuvastatin (CRESTOR) 5 MG tablet; Take 1 tablet (5 mg total) by mouth daily.  Dispense: 90 tablet; Refill: 3  7. Current moderate episode of major depressive disorder, unspecified whether recurrent (HCC) - Ambulatory referral to Psychiatry - CMP14+EGFR - CBC with Differential/Platelet  8. Anxiety - Ambulatory referral to Psychiatry - gabapentin (NEURONTIN) 300 MG capsule; Take 1 capsule (300 mg total) by mouth 3 (three) times daily. (NEEDS TO BE SEEN BEFORE NEXT REFILL)  Dispense: 90 capsule; Refill: 0 - CMP14+EGFR - CBC with Differential/Platelet - TSH  9. Sciatica, unspecified laterality - gabapentin (NEURONTIN) 300 MG capsule; Take 1 capsule (300 mg total) by mouth 3 (three) times daily. (NEEDS TO BE SEEN BEFORE NEXT REFILL)  Dispense: 90 capsule; Refill: 0 - CMP14+EGFR - CBC with Differential/Platelet  10. Decreased appetite -Only takes Megace as needed  - CMP14+EGFR - CBC with Differential/Platelet - TSH - megestrol (MEGACE) 40 MG/ML suspension; Take 20 mLs (800 mg total) by mouth daily. (NEEDS TO BE SEEN BEFORE NEXT REFILL)  Dispense: 600 mL; Refill: 0  11. Abnormal weight loss - CMP14+EGFR - CBC with Differential/Platelet - TSH - megestrol (MEGACE) 40 MG/ML suspension; Take 20 mLs (800 mg total) by mouth daily. (NEEDS TO BE SEEN BEFORE NEXT REFILL)  Dispense: 600 mL; Refill: 0  12. Insomnia, unspecified type Will give trazodone 100 mg, can increase to 150 mg if not improving Sleep ritual  - Ambulatory referral to Psychiatry - traZODone (DESYREL) 100 MG tablet; Take 1-1.5 tablets (100-150 mg total) by mouth  at bedtime.  Dispense: 90 tablet; Refill: 1 - CMP14+EGFR - CBC with Differential/Platelet   Labs pending Health Maintenance reviewed Diet and exercise encouraged  Follow up plan: 3 months    Christy Hawks, FNP    

## 2020-12-08 NOTE — Patient Instructions (Signed)

## 2020-12-09 LAB — CBC WITH DIFFERENTIAL/PLATELET
Basophils Absolute: 0.1 10*3/uL (ref 0.0–0.2)
Basos: 0 %
EOS (ABSOLUTE): 0.1 10*3/uL (ref 0.0–0.4)
Eos: 1 %
Hematocrit: 43.7 % (ref 34.0–46.6)
Hemoglobin: 15.1 g/dL (ref 11.1–15.9)
Immature Grans (Abs): 0.1 10*3/uL (ref 0.0–0.1)
Immature Granulocytes: 1 %
Lymphocytes Absolute: 5.1 10*3/uL — ABNORMAL HIGH (ref 0.7–3.1)
Lymphs: 32 %
MCH: 31.7 pg (ref 26.6–33.0)
MCHC: 34.6 g/dL (ref 31.5–35.7)
MCV: 92 fL (ref 79–97)
Monocytes Absolute: 1 10*3/uL — ABNORMAL HIGH (ref 0.1–0.9)
Monocytes: 6 %
Neutrophils Absolute: 9.7 10*3/uL — ABNORMAL HIGH (ref 1.4–7.0)
Neutrophils: 60 %
Platelets: 430 10*3/uL (ref 150–450)
RBC: 4.76 x10E6/uL (ref 3.77–5.28)
RDW: 13.1 % (ref 11.7–15.4)
WBC: 16.1 10*3/uL — ABNORMAL HIGH (ref 3.4–10.8)

## 2020-12-09 LAB — CMP14+EGFR
ALT: 9 IU/L (ref 0–32)
AST: 12 IU/L (ref 0–40)
Albumin/Globulin Ratio: 2 (ref 1.2–2.2)
Albumin: 4.7 g/dL (ref 3.8–4.9)
Alkaline Phosphatase: 104 IU/L (ref 44–121)
BUN/Creatinine Ratio: 13 (ref 9–23)
BUN: 11 mg/dL (ref 6–24)
Bilirubin Total: 0.4 mg/dL (ref 0.0–1.2)
CO2: 22 mmol/L (ref 20–29)
Calcium: 9.7 mg/dL (ref 8.7–10.2)
Chloride: 98 mmol/L (ref 96–106)
Creatinine, Ser: 0.84 mg/dL (ref 0.57–1.00)
Globulin, Total: 2.4 g/dL (ref 1.5–4.5)
Glucose: 74 mg/dL (ref 70–99)
Potassium: 4.6 mmol/L (ref 3.5–5.2)
Sodium: 135 mmol/L (ref 134–144)
Total Protein: 7.1 g/dL (ref 6.0–8.5)
eGFR: 80 mL/min/{1.73_m2} (ref >59)

## 2020-12-09 LAB — TSH: TSH: 1.28 u[IU]/mL (ref 0.450–4.500)

## 2020-12-10 DIAGNOSIS — D72829 Elevated white blood cell count, unspecified: Secondary | ICD-10-CM | POA: Diagnosis not present

## 2020-12-20 ENCOUNTER — Other Ambulatory Visit: Payer: Self-pay

## 2020-12-20 ENCOUNTER — Encounter: Payer: Self-pay | Admitting: Family

## 2020-12-20 ENCOUNTER — Ambulatory Visit: Payer: Medicaid Other | Admitting: Family

## 2020-12-20 VITALS — BP 133/78 | HR 58 | Temp 97.5°F | Ht 63.0 in | Wt 149.2 lb

## 2020-12-20 DIAGNOSIS — K649 Unspecified hemorrhoids: Secondary | ICD-10-CM

## 2020-12-20 DIAGNOSIS — B370 Candidal stomatitis: Secondary | ICD-10-CM

## 2020-12-20 DIAGNOSIS — L309 Dermatitis, unspecified: Secondary | ICD-10-CM | POA: Diagnosis not present

## 2020-12-20 MED ORDER — TRIAMCINOLONE ACETONIDE 0.5 % EX OINT: 1.0000 "application " | TOPICAL_OINTMENT | Freq: Two times a day (BID) | CUTANEOUS | 1 refills | Status: DC

## 2020-12-20 MED ORDER — MAGIC MOUTHWASH W/LIDOCAINE
10.0000 mL | Freq: Three times a day (TID) | ORAL | 1 refills | Status: DC | PRN
Start: 2020-12-20 — End: 2022-03-06

## 2020-12-20 MED ORDER — HYDROCORTISONE ACETATE 25 MG RE SUPP: 25.0000 mg | Freq: Two times a day (BID) | RECTAL | 0 refills | Status: DC

## 2020-12-20 NOTE — Progress Notes (Signed)
Subjective:    Patient ID: [name], female    DOB: [birth year], 60 y.o.   MRN: [id number]  No chief complaint on file.   Oral Pain   Hand Pain   PT presents to the office today with bilateral and dry and cracked. She has used OTC lotions, wearing gloves, and Vaseline with mild relief.    She is also complaining coating on tongue and swelling.  She reports eating salt and spicy foods make it worse.   She is also still complaining of hemorrhoids and requesting refills of her cream.   Review of Systems  All other systems reviewed and are negative.     Objective:   Physical Exam Vitals reviewed.  Constitutional:      General: She is not in acute distress.    Appearance: She is well-developed.  HENT:     Head: Normocephalic and atraumatic.     Mouth/Throat:     Comments: White coating on tongue, sore on inner lip Eyes:     Pupils: Pupils are equal, round, and reactive to light.  Neck:     Thyroid: No thyromegaly.  Cardiovascular:     Rate and Rhythm: Normal rate and regular rhythm.     Heart sounds: Normal heart sounds. No murmur heard. Pulmonary:     Effort: Pulmonary effort is normal. No respiratory distress.     Breath sounds: Normal breath sounds. No wheezing.  Abdominal:     General: Bowel sounds are normal. There is no distension.     Palpations: Abdomen is soft.     Tenderness: There is no abdominal tenderness.  Musculoskeletal:        General: No tenderness. Normal range of motion.     Cervical back: Normal range of motion and neck supple.  Skin:    General: Skin is warm and dry.     Comments: Small cracked skin on tips of fingers  Neurological:     Mental Status: She is alert and oriented to person, place, and time.     Cranial Nerves: No cranial nerve deficit.     Deep Tendon Reflexes: Reflexes are normal and symmetric.  Psychiatric:        Behavior: Behavior normal.        Thought Content: Thought content normal.        Judgment: Judgment  normal.     BP 133/78   Pulse (!) 58   Temp (!) 97.5 F (36.4 C) (Temporal)   Ht 5\' 3"  (1.6 m)   Wt 149 lb 3.2 oz (67.7 kg)   BMI 26.43 kg/m       Assessment & Plan:  [name] comes in today with chief complaint of Oral Pain and Hand Pain   Diagnosis and orders addressed:  1. Eczema, unspecified type Continue to use moisturizer  Avoid hot baths - triamcinolone ointment (KENALOG) 0.5 %; Apply 1 application topically 2 (two) times daily.  Dispense: 60 g; Refill: 1  2. Oral thrush Rinse mouth after using inhalers  Avoid spicy and salty foods - magic mouthwash w/lidocaine SOLN; Take 10 mLs by mouth 3 (three) times daily as needed for mouth pain.  Dispense: 400 mL; Refill: 1  3. Hemorrhoids, unspecified hemorrhoid type Avoid straining  - hydrocortisone (ANUSOL-HC) 25 MG suppository; Place 1 suppository (25 mg total) rectally 2 (two) times daily.  Dispense: 12 suppository; Refill: 0   Health Maintenance reviewed Diet and exercise encouraged  Follow up plan: As needed  [name], FNP

## 2020-12-20 NOTE — Patient Instructions (Signed)

## 2020-12-26 ENCOUNTER — Other Ambulatory Visit: Payer: Self-pay | Admitting: Family

## 2020-12-26 DIAGNOSIS — K5900 Constipation, unspecified: Secondary | ICD-10-CM

## 2021-01-05 DIAGNOSIS — D72829 Elevated white blood cell count, unspecified: Secondary | ICD-10-CM | POA: Diagnosis not present

## 2021-01-06 DIAGNOSIS — Z79891 Long term (current) use of opiate analgesic: Secondary | ICD-10-CM | POA: Diagnosis not present

## 2021-01-06 DIAGNOSIS — F331 Major depressive disorder, recurrent, moderate: Secondary | ICD-10-CM | POA: Diagnosis not present

## 2021-01-06 DIAGNOSIS — F411 Generalized anxiety disorder: Secondary | ICD-10-CM | POA: Diagnosis not present

## 2021-01-18 ENCOUNTER — Other Ambulatory Visit: Payer: Self-pay | Admitting: Family

## 2021-01-18 DIAGNOSIS — M5430 Sciatica, unspecified side: Secondary | ICD-10-CM

## 2021-01-18 DIAGNOSIS — F419 Anxiety disorder, unspecified: Secondary | ICD-10-CM

## 2021-01-20 DIAGNOSIS — J029 Acute pharyngitis, unspecified: Secondary | ICD-10-CM | POA: Diagnosis not present

## 2021-01-25 DIAGNOSIS — R062 Wheezing: Secondary | ICD-10-CM | POA: Diagnosis not present

## 2021-01-25 DIAGNOSIS — Z03818 Encounter for observation for suspected exposure to other biological agents ruled out: Secondary | ICD-10-CM | POA: Diagnosis not present

## 2021-01-25 DIAGNOSIS — R059 Cough, unspecified: Secondary | ICD-10-CM | POA: Diagnosis not present

## 2021-01-29 ENCOUNTER — Other Ambulatory Visit: Payer: Self-pay | Admitting: Family

## 2021-01-29 DIAGNOSIS — G4700 Insomnia, unspecified: Secondary | ICD-10-CM

## 2021-02-06 DIAGNOSIS — F411 Generalized anxiety disorder: Secondary | ICD-10-CM | POA: Diagnosis not present

## 2021-02-06 DIAGNOSIS — F331 Major depressive disorder, recurrent, moderate: Secondary | ICD-10-CM | POA: Diagnosis not present

## 2021-02-16 ENCOUNTER — Other Ambulatory Visit: Payer: Self-pay | Admitting: Family

## 2021-02-16 DIAGNOSIS — R630 Anorexia: Secondary | ICD-10-CM

## 2021-02-16 DIAGNOSIS — R634 Abnormal weight loss: Secondary | ICD-10-CM

## 2021-03-02 DIAGNOSIS — M542 Cervicalgia: Secondary | ICD-10-CM | POA: Diagnosis not present

## 2021-03-02 DIAGNOSIS — Z6826 Body mass index (BMI) 26.0-26.9, adult: Secondary | ICD-10-CM | POA: Diagnosis not present

## 2021-03-25 ENCOUNTER — Other Ambulatory Visit: Payer: Self-pay | Admitting: Family

## 2021-03-31 ENCOUNTER — Other Ambulatory Visit: Payer: Self-pay | Admitting: Family

## 2021-03-31 DIAGNOSIS — L309 Dermatitis, unspecified: Secondary | ICD-10-CM

## 2021-03-31 DIAGNOSIS — R630 Anorexia: Secondary | ICD-10-CM

## 2021-03-31 DIAGNOSIS — R634 Abnormal weight loss: Secondary | ICD-10-CM

## 2021-04-12 ENCOUNTER — Ambulatory Visit (HOSPITAL_COMMUNITY): Payer: Medicaid Other

## 2021-04-12 ENCOUNTER — Other Ambulatory Visit: Payer: Self-pay

## 2021-04-12 ENCOUNTER — Ambulatory Visit (HOSPITAL_COMMUNITY)
Admission: RE | Admit: 2021-04-12 | Discharge: 2021-04-12 | Disposition: A | Discharge status: Home / Self Care | Payer: Medicaid Other | Source: Ambulatory Visit | Attending: Physician Assistant | Admitting: Physician Assistant

## 2021-04-12 DIAGNOSIS — Z87891 Personal history of nicotine dependence: Secondary | ICD-10-CM | POA: Insufficient documentation

## 2021-04-12 DIAGNOSIS — M542 Cervicalgia: Secondary | ICD-10-CM | POA: Diagnosis not present

## 2021-04-12 DIAGNOSIS — Z122 Encounter for screening for malignant neoplasm of respiratory organs: Secondary | ICD-10-CM | POA: Insufficient documentation

## 2021-04-12 DIAGNOSIS — M546 Pain in thoracic spine: Secondary | ICD-10-CM | POA: Diagnosis not present

## 2021-04-12 DIAGNOSIS — M5416 Radiculopathy, lumbar region: Secondary | ICD-10-CM | POA: Diagnosis not present

## 2021-04-17 DIAGNOSIS — L03211 Cellulitis of face: Secondary | ICD-10-CM | POA: Diagnosis not present

## 2021-04-19 ENCOUNTER — Encounter (HOSPITAL_COMMUNITY): Payer: Self-pay

## 2021-04-19 DIAGNOSIS — L0201 Cutaneous abscess of face: Secondary | ICD-10-CM | POA: Diagnosis not present

## 2021-04-19 NOTE — Progress Notes (Signed)
Patient notified via telephone of LDCT lung cancer screening results with recommendations to follow up in 12 months. Also notified of incidental findings and need to follow up with PCP. ?

## 2021-04-20 ENCOUNTER — Other Ambulatory Visit: Payer: Self-pay | Admitting: Family

## 2021-05-16 ENCOUNTER — Other Ambulatory Visit (HOSPITAL_COMMUNITY): Payer: Self-pay | Admitting: *Deleted

## 2021-05-16 DIAGNOSIS — R051 Acute cough: Secondary | ICD-10-CM | POA: Diagnosis not present

## 2021-05-16 DIAGNOSIS — J029 Acute pharyngitis, unspecified: Secondary | ICD-10-CM | POA: Diagnosis not present

## 2021-05-16 DIAGNOSIS — D72829 Elevated white blood cell count, unspecified: Secondary | ICD-10-CM

## 2021-05-16 DIAGNOSIS — Z122 Encounter for screening for malignant neoplasm of respiratory organs: Secondary | ICD-10-CM

## 2021-05-16 DIAGNOSIS — J020 Streptococcal pharyngitis: Secondary | ICD-10-CM | POA: Diagnosis not present

## 2021-05-17 ENCOUNTER — Inpatient Hospital Stay (HOSPITAL_COMMUNITY): Payer: Medicaid Other

## 2021-05-24 ENCOUNTER — Ambulatory Visit (HOSPITAL_COMMUNITY): Payer: Medicaid Other | Admitting: Physician Assistant

## 2021-05-30 DIAGNOSIS — L700 Acne vulgaris: Secondary | ICD-10-CM | POA: Diagnosis not present

## 2021-05-30 DIAGNOSIS — R21 Rash and other nonspecific skin eruption: Secondary | ICD-10-CM | POA: Diagnosis not present

## 2021-06-01 ENCOUNTER — Encounter: Payer: Self-pay | Admitting: Family

## 2021-06-01 ENCOUNTER — Telehealth: Payer: Medicaid Other | Admitting: Family

## 2021-06-01 DIAGNOSIS — R21 Rash and other nonspecific skin eruption: Secondary | ICD-10-CM

## 2021-06-01 NOTE — Progress Notes (Signed)
?Virtual Visit Consent  ? ?[name], you are scheduled for a virtual visit with a [facility] provider today. Just as with appointments in the office, your consent must be obtained to participate. Your consent will be active for this visit and any virtual visit you may have with one of our providers in the next 365 days. If you have a MyChart account, a copy of this consent can be sent to you electronically. ? ?As this is a virtual visit, video technology does not allow for your provider to perform a traditional examination. This may limit your provider's ability to fully assess your condition. If your provider identifies any concerns that need to be evaluated in person or the need to arrange testing (such as labs, EKG, etc.), we will make arrangements to do so. Although advances in technology are sophisticated, we cannot ensure that it will always work on either your end or our end. If the connection with a video visit is poor, the visit may have to be switched to a telephone visit. With either a video or telephone visit, we are not always able to ensure that we have a secure connection. ? ?By engaging in this virtual visit, you consent to the provision of healthcare and authorize for your insurance to be billed (if applicable) for the services provided during this visit. Depending on your insurance coverage, you may receive a charge related to this service. ? ?I need to obtain your verbal consent now. Are you willing to proceed with your visit today? [name] has provided verbal consent on 06/01/2021 for a virtual visit (video or telephone). Julie Dun, FNP ? ?Date: 06/01/2021 10:08 AM ? ?Virtual Visit via Video Note  ? ?IEvelina Salazar, connected with  [name]  ([id number], 1960-12-15) on 06/01/21 at 10:25 AM EDT by a video-enabled telemedicine application and verified that I am speaking with the correct person using two identifiers. ? ?Location: ?Patient: Virtual Visit Location Patient:  Home ?Provider: Virtual Visit Location Provider: Home Office ?  ?I discussed the limitations of evaluation and management by telemedicine and the availability of in person appointments. The patient expressed understanding and agreed to proceed.   ? ?History of Present Illness: ?[name] is a 61 y.o. who identifies as a female who was assigned female at birth, and is being seen today for rash on her face with blisters that started three weeks ago. She was seen at the Urgent Care on 05/30/21 and was given prednisone and Augmentin with mild relief.  ? ?HPI: Rash ?This is a new problem. The current episode started 1 to 4 weeks ago. The problem has been waxing and waning since onset. The affected locations include the face. The rash is characterized by blistering, redness, draining and pain. Past treatments include antibiotic cream, anti-itch cream and oral steroids. The treatment provided mild relief.   ?Problems:  ?Patient Active Problem List  ? Diagnosis Date Noted  ? Trigger thumb, right thumb 10/11/2020  ? Cough 10/19/2019  ? Vitamin D deficiency 07/20/2019  ? Benzodiazepine dependence ([place]) 11/29/2017  ? Controlled substance agreement signed 11/29/2017  ? Aortic atherosclerosis ([place]) 03/28/2017  ? COPD (chronic obstructive pulmonary disease) ([place]) 12/10/2016  ? HSV-1 infection   ? Tobacco user   ? Anxiety   ? Depression   ? Hyperlipidemia   ? Sciatica   ? Back pain   ? Right leg pain   ? Hip pain   ? Postmenopausal atrophic vaginitis 04/15/2012  ?  Pelvic pain in female 04/15/2012  ? HEMORRHAGE OF RECTUM AND ANUS 06/16/2009  ?  ?Allergies:  ?Allergies  ?Allergen Reactions  ? Bee Venom Anaphylaxis  ? Codeine Itching  ? Meperidine Hcl Other (See Comments)  ?  Makes her feel strange  ? Sulfa Antibiotics Other (See Comments)  ?  Joint pain  ? Doxycycline Swelling and Dermatitis  ?  Burning, red skin when she takes it.  ? ?Medications:  ?Current Outpatient Medications:  ?  albuterol (PROVENTIL) (2.5 MG/3ML) 0.083%  nebulizer solution, Take 3 mLs (2.5 mg total) by nebulization every 6 (six) hours as needed for wheezing or shortness of breath., Disp: 150 mL, Rfl: 1 ?  aspirin EC 81 MG tablet, Take 81 mg by mouth daily., Disp: , Rfl:  ?  Cholecalciferol 25 MCG (1000 UT) capsule, TAKE 2 SOFTGELS (2,000 UNITS TOTAL) BY MOUTH DAILY., Disp: , Rfl:  ?  [employer] D3 25 MCG (1000 UT) capsule, TAKE 2 SOFTGELS (2,000 UNITS TOTAL) BY MOUTH DAILY., Disp: 180 capsule, Rfl: 2 ?  DULERA 200-5 MCG/ACT AERO, INHALE 2 PUFFS BY MOUTH INTO THE LUNGS DAILY, Disp: 13 each, Rfl: 2 ?  folic acid (FOLVITE) 1 MG tablet, TAKE 1 TABLET BY MOUTH EVERY DAY, Disp: 90 tablet, Rfl: 6 ?  gabapentin (NEURONTIN) 300 MG capsule, Take 1 capsule (300 mg total) by mouth 3 (three) times daily., Disp: 90 capsule, Rfl: 2 ?  HYDROcodone-acetaminophen (NORCO) 10-325 MG tablet, Take 1 tablet by mouth every 6 (six) hours as needed., Disp: , Rfl:  ?  hydrOXYzine (ATARAX/VISTARIL) 25 MG tablet, Take 25 mg by mouth every 6 (six) hours as needed., Disp: , Rfl:  ?  magic mouthwash w/lidocaine SOLN, Take 10 mLs by mouth 3 (three) times daily as needed for mouth pain., Disp: 400 mL, Rfl: 1 ?  megestrol (MEGACE) 40 MG/ML suspension, TAKE 20 MLS (800 MG TOTAL) BY MOUTH DAILY. (NEEDS TO BE SEEN BEFORE NEXT REFILL), Disp: 600 mL, Rfl: 0 ?  methocarbamol (ROBAXIN) 500 MG tablet, Take by mouth., Disp: , Rfl:  ?  polyethylene glycol powder (GLYCOLAX/MIRALAX) 17 GM/SCOOP powder, TAKE 17 G BY MOUTH 2 (TWO) TIMES DAILY AS NEEDED. (NEEDS TO BE SEEN BEFORE NEXT REFILL), Disp: 119 g, Rfl: 0 ?  rosuvastatin (CRESTOR) 5 MG tablet, Take 1 tablet (5 mg total) by mouth daily., Disp: 90 tablet, Rfl: 3 ?  traZODone (DESYREL) 100 MG tablet, TAKE 1-1.5 TABLETS (100-150 MG TOTAL) BY MOUTH AT BEDTIME., Disp: 135 tablet, Rfl: 1 ?  triamcinolone ointment (KENALOG) 0.5 %, APPLY TO AFFECTED AREA TWICE A DAY, Disp: 60 g, Rfl: 1 ?  ZTLIDO 1.8 % PTCH, Apply 1 patch topically daily., Disp: , Rfl:   ? ?Observations/Objective: ?Patient is well-developed, well-nourished in no acute distress.  ?Resting comfortably  at home.  ?Head is normocephalic, atraumatic.  ?No labored breathing.  ?Speech is clear and coherent with logical content.  ?Patient is alert and oriented at baseline.  ?Small blisters on chest and lower legs ? ?Assessment and Plan: ?1. Blistering rash ?- Ambulatory referral to Dermatology ? ?Continue prednisone and Augmentin ?Avoid picking or scratching  ?Report any s/s of infection  ?Referral to Derm pending ? ?Follow Up Instructions: ?I discussed the assessment and treatment plan with the patient. The patient was provided an opportunity to ask questions and all were answered. The patient agreed with the plan and demonstrated an understanding of the instructions.  A copy of instructions were sent to the patient via MyChart unless otherwise noted below.  ? ? ? ?  The patient was advised to call back or seek an in-person evaluation if the symptoms worsen or if the condition fails to improve as anticipated. ? ?Time:  ?I spent 11 minutes with the patient via telehealth technology discussing the above problems/concerns.   ? ?Julie Dun, FNP ? ?

## 2021-06-03 ENCOUNTER — Other Ambulatory Visit: Payer: Self-pay | Admitting: Family

## 2021-06-03 DIAGNOSIS — R630 Anorexia: Secondary | ICD-10-CM

## 2021-06-03 DIAGNOSIS — R634 Abnormal weight loss: Secondary | ICD-10-CM

## 2021-06-05 MED ORDER — MEGESTROL ACETATE 40 MG/ML PO SUSP: 800.0000 mg | Freq: Every day | ORAL | 0 refills | Status: DC

## 2021-06-05 NOTE — Telephone Encounter (Signed)
[facility]. NTBS 30 days given 04/09/21 ?

## 2021-06-05 NOTE — Telephone Encounter (Signed)
Apt scheduled 06/23/2021 ?

## 2021-06-05 NOTE — Addendum Note (Signed)
Addended by: Antonietta Barcelona D on: 06/05/2021 10:36 AM ? ? Modules accepted: Orders ? ?

## 2021-06-07 DIAGNOSIS — L281 Prurigo nodularis: Secondary | ICD-10-CM | POA: Diagnosis not present

## 2021-06-16 ENCOUNTER — Other Ambulatory Visit: Payer: Self-pay | Admitting: Family

## 2021-06-16 DIAGNOSIS — F419 Anxiety disorder, unspecified: Secondary | ICD-10-CM

## 2021-06-16 DIAGNOSIS — M5430 Sciatica, unspecified side: Secondary | ICD-10-CM

## 2021-06-23 ENCOUNTER — Encounter: Payer: Self-pay | Admitting: Family

## 2021-06-23 ENCOUNTER — Other Ambulatory Visit: Payer: Self-pay | Admitting: Family

## 2021-06-23 ENCOUNTER — Ambulatory Visit: Payer: Medicaid Other | Admitting: Family

## 2021-06-23 VITALS — BP 100/60 | HR 72 | Temp 97.7°F | Ht 63.0 in | Wt 146.8 lb

## 2021-06-23 DIAGNOSIS — I700 Atherosclerosis of aorta: Secondary | ICD-10-CM | POA: Diagnosis not present

## 2021-06-23 DIAGNOSIS — R319 Hematuria, unspecified: Secondary | ICD-10-CM

## 2021-06-23 DIAGNOSIS — R3915 Urgency of urination: Secondary | ICD-10-CM

## 2021-06-23 DIAGNOSIS — L0100 Impetigo, unspecified: Secondary | ICD-10-CM

## 2021-06-23 DIAGNOSIS — E559 Vitamin D deficiency, unspecified: Secondary | ICD-10-CM | POA: Diagnosis not present

## 2021-06-23 DIAGNOSIS — F419 Anxiety disorder, unspecified: Secondary | ICD-10-CM

## 2021-06-23 DIAGNOSIS — M5430 Sciatica, unspecified side: Secondary | ICD-10-CM

## 2021-06-23 DIAGNOSIS — F321 Major depressive disorder, single episode, moderate: Secondary | ICD-10-CM

## 2021-06-23 DIAGNOSIS — E785 Hyperlipidemia, unspecified: Secondary | ICD-10-CM

## 2021-06-23 DIAGNOSIS — Z720 Tobacco use: Secondary | ICD-10-CM | POA: Diagnosis not present

## 2021-06-23 DIAGNOSIS — J410 Simple chronic bronchitis: Secondary | ICD-10-CM

## 2021-06-23 LAB — MICROSCOPIC EXAMINATION
Epithelial Cells (non renal): >10 /hpf — AB (ref 0–10)
Renal Epithel, UA: NONE SEEN /hpf

## 2021-06-23 LAB — URINALYSIS, COMPLETE
Bilirubin, UA: NEGATIVE
Glucose, UA: NEGATIVE
Ketones, UA: NEGATIVE
Nitrite, UA: NEGATIVE
Protein,UA: NEGATIVE
Specific Gravity, UA: 1.015 (ref 1.005–1.030)
Urobilinogen, Ur: 1 mg/dL (ref 0.2–1.0)
pH, UA: 6.5 (ref 5.0–7.5)

## 2021-06-23 LAB — CMP14+EGFR
ALT: 14 IU/L (ref 0–32)
AST: 14 IU/L (ref 0–40)
Albumin/Globulin Ratio: 2.3 — ABNORMAL HIGH (ref 1.2–2.2)
Albumin: 4.1 g/dL (ref 3.8–4.9)
Alkaline Phosphatase: 86 IU/L (ref 44–121)
BUN/Creatinine Ratio: 8 — ABNORMAL LOW (ref 12–28)
BUN: 7 mg/dL — ABNORMAL LOW (ref 8–27)
Bilirubin Total: 0.3 mg/dL (ref 0.0–1.2)
CO2: 21 mmol/L (ref 20–29)
Calcium: 9.6 mg/dL (ref 8.7–10.3)
Chloride: 105 mmol/L (ref 96–106)
Creatinine, Ser: 0.84 mg/dL (ref 0.57–1.00)
Globulin, Total: 1.8 g/dL (ref 1.5–4.5)
Glucose: 99 mg/dL (ref 70–99)
Potassium: 4 mmol/L (ref 3.5–5.2)
Sodium: 140 mmol/L (ref 134–144)
Total Protein: 5.9 g/dL — ABNORMAL LOW (ref 6.0–8.5)
eGFR: 80 mL/min/1.73

## 2021-06-23 MED ORDER — GABAPENTIN 300 MG PO CAPS: ORAL_CAPSULE | ORAL | 2 refills | Status: DC

## 2021-06-23 MED ORDER — MUPIROCIN CALCIUM 2 % EX CREA: 1.0000 "application " | TOPICAL_CREAM | Freq: Two times a day (BID) | CUTANEOUS | 1 refills | Status: DC

## 2021-06-23 NOTE — Progress Notes (Signed)
Subjective:    Patient ID: [name], female    DOB: October 15, 1960, 61 y.o.   MRN: [id number]  Chief Complaint  Patient presents with   Medication Refill   Rash   Pt presents to the office today chronic follow up.    She is followed by Pain Clinic 3 months for chronic back pain.     She is followed by Hematologists every 6 months for Leukocytosis.    She has COPD and is a  pack smoker for the last 30 years. She reports she has intermittent SOB. She using dulera.    She has aortic atherosclerosis and hyperlipidemia. She is prescribed Crestor, but does not take regularly.   She saw [facility], but did not like them and stopped.   She was seen by dermatologists for rash on face and legs.  Rash This is a new problem. The current episode started more than 1 month ago. The problem has been waxing and waning since onset. The affected locations include the face, left upper leg, left lower leg, right lower leg and right upper leg. The rash is characterized by redness and itchiness. She was exposed to nothing. Past treatments include antihistamine and anti-itch cream. The treatment provided mild relief.  Hyperlipidemia This is a chronic problem. The current episode started more than 1 year ago. The problem is uncontrolled. Factors aggravating her hyperlipidemia include smoking. Current antihyperlipidemic treatment includes bile acid sequestrants. The current treatment provides no improvement of lipids. Risk factors for coronary artery disease include dyslipidemia and a sedentary lifestyle.  Depression        This is a chronic problem.  The current episode started more than 1 year ago.   The onset quality is gradual.   The problem occurs intermittently.  Associated symptoms include helplessness, hopelessness, irritable, restlessness, decreased interest and sad.  Past treatments include nothing.  Past medical history includes anxiety.   Anxiety Presents for follow-up visit. Symptoms  include depressed mood, excessive worry, irritability, nervous/anxious behavior and restlessness. Symptoms occur most days. The severity of symptoms is moderate.    Nicotine Dependence Presents for follow-up visit. Symptoms include irritability. Her urge triggers include company of smokers. The symptoms have been stable. She smokes 1 pack of cigarettes per day.     Review of Systems  Constitutional:  Positive for irritability.  Skin:  Positive for rash.  Psychiatric/Behavioral:  Positive for depression. The patient is nervous/anxious.   All other systems reviewed and are negative.     Objective:   Physical Exam Vitals reviewed.  Constitutional:      General: She is irritable. She is not in acute distress.    Appearance: She is well-developed.  HENT:     Head: Normocephalic and atraumatic.     Right Ear: Tympanic membrane normal.     Left Ear: Tympanic membrane normal.  Eyes:     Pupils: Pupils are equal, round, and reactive to light.  Neck:     Thyroid: No thyromegaly.  Cardiovascular:     Rate and Rhythm: Normal rate and regular rhythm.     Heart sounds: Normal heart sounds. No murmur heard. Pulmonary:     Effort: Pulmonary effort is normal. No respiratory distress.     Breath sounds: Normal breath sounds. No wheezing.  Abdominal:     General: Bowel sounds are normal. There is no distension.     Palpations: Abdomen is soft.     Tenderness: There is no abdominal tenderness.  Musculoskeletal:  General: No tenderness. Normal range of motion.     Cervical back: Normal range of motion and neck supple.  Skin:    General: Skin is warm and dry.     Findings: Lesion present.          Comments: Yellow crusted lesion on chin  Neurological:     Mental Status: She is alert and oriented to person, place, and time.     Cranial Nerves: No cranial nerve deficit.     Deep Tendon Reflexes: Reflexes are normal and symmetric.  Psychiatric:        Mood and Affect: Mood is  anxious.        Behavior: Behavior normal.        Thought Content: Thought content normal.        Judgment: Judgment normal.      BP 100/60   Pulse 72   Temp 97.7 F (36.5 C)   Ht 5' 3"  (1.6 m)   Wt 146 lb 12.8 oz (66.6 kg)   SpO2 98%   BMI 26.00 kg/m      Assessment & Plan:  [name] comes in today with chief complaint of Medication Refill and Rash   Diagnosis and orders addressed:  1. Aortic atherosclerosis (HCC) - CMP14+EGFR  2. Simple chronic bronchitis (HCC) - CMP14+EGFR  3. Vitamin D deficiency - CMP14+EGFR  4. Tobacco user - CMP14+EGFR  5. Hyperlipidemia, unspecified hyperlipidemia type - CMP14+EGFR  6. Current moderate episode of major depressive disorder, unspecified whether recurrent (HCC) - CMP14+EGFR  7. Anxiety - CMP14+EGFR - gabapentin (NEURONTIN) 300 MG capsule; TAKE 1 CAPSULE BY MOUTH THREE TIMES A DAY  Dispense: 90 capsule; Refill: 2  8. Impetigo Start Bactroban BID  - CMP14+EGFR - mupirocin cream (BACTROBAN) 2 %; Apply 1 application. topically 2 (two) times daily.  Dispense: 60 g; Refill: 1  9. Sciatica, unspecified laterality - gabapentin (NEURONTIN) 300 MG capsule; TAKE 1 CAPSULE BY MOUTH THREE TIMES A DAY  Dispense: 90 capsule; Refill: 2   Labs pending Health Maintenance reviewed Diet and exercise encouraged  Follow up plan: 4 months    [name], FNP

## 2021-06-23 NOTE — Addendum Note (Signed)
Addended by: [name] A on: 06/23/2021 11:30 AM   Modules accepted: Orders

## 2021-06-23 NOTE — Patient Instructions (Signed)
Impetigo, Adult Impetigo is an infection of the skin. It commonly occurs in young children, but it can also occur in adults. The infection causes itchy blisters and sores that produce brownish-yellow fluid. As the fluid dries, it forms a thick, honey-colored crust. These skin changes usually occur on the face, but they can also affect other areas of the body. Impetigo usually goes away in 7-10 days with treatment. What are the causes? This condition is caused by two types of bacteria. It may be caused by staphylococci or streptococci bacteria. These bacteria cause impetigo when they get under the surface of the skin. This often happens after some damage to the skin, such as: Cuts, scrapes, or scratches. Rashes. Insect bites, especially when you scratch the area of a bite. Chickenpox or other illnesses that cause open skin sores. Nail biting or chewing. Impetigo can spread easily from one person to another (is contagious). It may be spread through close skin contact or by sharing towels, clothing, or other items that an infected person has touched. Scratching the affected area can cause impetigo to spread to other parts of the body. The bacteria can get under your fingernails and spread when you touch another area of your skin. What increases the risk? The following factors may make you more likely to develop this condition: Playing sports that include skin-to-skin contact with others. Having broken skin, such as from a cut or scrape. Living in an area that has high humidity levels. Having poor hygiene. Having high levels of staphylococci in your nose. Having a condition that weakens the skin integrity, such as: Having a weak body defense system (immune system). Having a skin condition with open sores, such as chickenpox. Having diabetes. What are the signs or symptoms? The main symptom of this condition is small blisters, often on the face around the mouth and nose. In time, the blisters break  open and turn into tiny sores (lesions) with a yellow crust. In some cases, the blisters cause itching or burning. Scratching, irritation, or lack of treatment may cause these small lesions to get larger. Other possible symptoms include: Larger blisters. Pus. Swollen lymph glands. How is this diagnosed? This condition is usually diagnosed during a physical exam. A skin sample or a sample of fluid from a blister may be taken for lab tests that involve growing bacteria (culture test). Lab tests can help confirm the diagnosis or help determine the best treatment. How is this treated? Treatment for this condition depends on the severity of the condition: Mild impetigo can be treated with prescription antibiotic cream. Oral antibiotic medicine may be used in more severe cases. Medicines that reduce itchiness (antihistamines)may also be used. Follow these instructions at home: Medicines Take over-the-counter and prescription medicines only as told by your health care provider. Apply or take your antibiotic as told by your health care provider. Do not stop using the antibiotic even if your condition improves. Before applying antibiotic cream or ointment, you should: Gently wash the infected areas with antibacterial soap and warm water. Soak crusted areas in warm, soapy water using antibacterial soap. Gently rub the areas to remove crusts. Do not scrub. Preventing the spread of infection  To help prevent impetigo from spreading to other body areas: Keep your fingernails short and clean. Do not scratch the blisters or sores. Cover infected areas, if necessary, to keep from scratching. Wash your hands often with soap and warm water. To help prevent impetigo from spreading to other people: Do not share towels.   Wash your clothing and bedsheets in water that is 140F (60C) or warmer. Stay home until you have used an antibiotic cream for 48 hours (2 days) or an oral antibiotic medicine for 24 hours  (1 day). You should only return to work and activities with other people if your skin shows significant improvement. You may return to contact sports after you have used antibiotic medicine for 72 hours (3 days). General instructions Keep all follow-up visits. This is important. How is this prevented? Wash your hands often with soap and warm water. Do not share towels, washcloths, clothing, bedding, or razors. Keep your fingernails short. Keep any cuts, scrapes, bug bites, or rashes clean and covered. Use insect repellent to prevent bug bites. Contact a health care provider if: You develop more blisters or sores, even with treatment. Other family members get sores. Your skin sores are not improving after 72 hours (3 days) of treatment. You have a fever. Get help right away if: You see spreading redness or swelling of the skin around your sores. You develop a sore throat. The area around your rash becomes warm, red, or tender to the touch. You have dark, reddish-brown urine. You do not urinate often or you urinate small amounts. You are very tired (lethargic). You have swelling in your face, hands, or feet. Summary Impetigo is a skin infection that causes itchy blisters and sores that produce brownish-yellow fluid. As the fluid dries, it forms a crust. This condition is caused by staphylococci or streptococci bacteria. These bacteria cause impetigo when they get under the surface of the skin, such as through cuts, rashes, bug bites, or open sores. Treatment for this condition may include antibiotic ointment or oral antibiotics. To help prevent impetigo from spreading to other body areas, make sure you keep your fingernails short, avoid scratching, cover any blisters, and wash your hands often. If you have impetigo, stay home until you have used an antibiotic cream for 48 hours (2 days) or an oral antibiotic medicine for 24 hours (1 day). You should only return to work and activities with  other people if your skin shows significant improvement. This information is not intended to replace advice given to you by your health care provider. Make sure you discuss any questions you have with your health care provider. Document Revised: 06/10/2019 Document Reviewed: 06/10/2019 Elsevier Patient Education  2023 Elsevier Inc.  

## 2021-06-27 NOTE — Addendum Note (Signed)
Addended by: [name] A on: 06/27/2021 11:51 AM   Modules accepted: Orders

## 2021-06-30 ENCOUNTER — Telehealth: Payer: Self-pay | Admitting: Family

## 2021-06-30 DIAGNOSIS — M542 Cervicalgia: Secondary | ICD-10-CM | POA: Diagnosis not present

## 2021-06-30 DIAGNOSIS — L0100 Impetigo, unspecified: Secondary | ICD-10-CM

## 2021-06-30 DIAGNOSIS — J029 Acute pharyngitis, unspecified: Secondary | ICD-10-CM | POA: Diagnosis not present

## 2021-06-30 MED ORDER — MUPIROCIN 2 % EX OINT: 1.0000 "application " | TOPICAL_OINTMENT | Freq: Two times a day (BID) | CUTANEOUS | 0 refills | Status: DC

## 2021-06-30 NOTE — Telephone Encounter (Signed)
Attempted to contact patient, unable to leave VM.

## 2021-06-30 NOTE — Telephone Encounter (Signed)
Patient said her insurance will not cover mupirocin ointment (BACTROBAN) 2 % and she would like to know if something else can be called in. Please call back and advise.

## 2021-06-30 NOTE — Addendum Note (Signed)
Addended by: [name] A on: 06/30/2021 12:17 PM   Modules accepted: Orders

## 2021-06-30 NOTE — Telephone Encounter (Signed)
Pt can use [facility] and get rx at [facility] for $18.

## 2021-06-30 NOTE — Telephone Encounter (Signed)
I already sent in prescription for cream to her pharmacy.   Julie Dun, FNP

## 2021-06-30 NOTE — Telephone Encounter (Signed)
Pt returned missed call. Made pt aware. Pt voiced understanding and will do what provider advised.

## 2021-07-17 NOTE — Progress Notes (Signed)
H&P  Chief Complaint: Blood in urine, lower urinary tract symptoms  History of Present Illness: [name] is a 61 y.o. year old female sent by Jannifer Rodney, NP for evaluation and management of persistent microscopic hematuria as well as urinary frequency and urgency.  She is a lifelong smoker.  She denies any gross hematuria.  She does drink significant amounts of cola beverages, for many years.  She has bothersome urinary frequency and urgency.  Rare urgency incontinence.  She is nondiabetic.  There is no history of kidney stones.  She has no associated flank or abdominal pain.  Past Medical History:  Diagnosis Date   Anxiety    Arthritis    Back pain    COPD (chronic obstructive pulmonary disease) (HCC)    Depression    Dyspnea    Hip pain    HSV-1 infection    Hyperlipidemia    Right leg pain    Sciatica    Tobacco user     Past Surgical History:  Procedure Laterality Date   BUNIONECTOMY     CATARACT EXTRACTION     CESAREAN SECTION     COLONOSCOPY W/ POLYPECTOMY     ELBOW SURGERY     NOVASURE ABLATION     RHYTIDECTOMY NECK / CHEEK / CHIN     TONSILLECTOMY     TRIGGER FINGER RELEASE Right 10/24/2020   Procedure: RELEASE TRIGGER RIGHT THUMB;  Surgeon: Marlyne Beards, MD;  Location: [facility];  Service: Orthopedics;  Laterality: Right;   TUBAL LIGATION      Home Medications:  (Not in a hospital admission)   Allergies:  Allergies  Allergen Reactions   Bee Venom Anaphylaxis   Codeine Itching   Meperidine Hcl Other (See Comments)    Makes her feel strange   Sulfa Antibiotics Other (See Comments)    Joint pain   Doxycycline Swelling and Dermatitis    Burning, red skin when she takes it.    Family History  Problem Relation Age of Onset   Brain cancer Mother    Cancer - Other Father        Abdominal    Colon cancer Sister    Colon cancer Paternal Grandmother     Social History:  reports that she has been smoking cigarettes. She  started smoking about 45 years ago. She has been smoking an average of 1 pack per day. She has never used smokeless tobacco. She reports that she does not drink alcohol and does not use drugs.  ROS: A complete review of systems was performed.  All systems are negative except for pertinent findings as noted.  Physical Exam:  Vital signs in last 24 hours: @VSRANGES @ General:  Alert and oriented, No acute distress HEENT: Normocephalic, atraumatic Neck: No JVD or lymphadenopathy Cardiovascular: Regular rate  Lungs: Normal inspiratory/expiratory excursion Abdomen: Soft, nontender, nondistended, no abdominal masses Back: No CVA tenderness Extremities: No edema Neurologic: Grossly intact  I have reviewed prior pt notes  I have reviewed notes from referring/previous physicians  I have reviewed urinalysis results  I have independently reviewed prior imaging--CT images from 2019 revealed normal-appearing kidneys with a distended bladder.  I have reviewed prior urine culture  Bladder scan today revealed a residual urine volume of 0  Impression/Assessment:  1.  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  This needs evaluation  2.  Lower urinary tract symptoms, no doubt exacerbated by her significant caffeine/sugar-containing beverages  Plan:  1.  Overactive bladder guide given  to the patient-limiting caffeine/sugary beverages  2.  I will perform CT abdomen and pelvis hematuria protocol and bring her back for cystoscopy  Bertram Millard [place] 07/17/2021, 8:47 PM  Bertram Millard. Jensine Luz MD

## 2021-07-18 ENCOUNTER — Encounter: Payer: Self-pay | Admitting: Urology

## 2021-07-18 ENCOUNTER — Inpatient Hospital Stay (HOSPITAL_COMMUNITY): Payer: Medicaid Other | Attending: Physician Assistant

## 2021-07-18 ENCOUNTER — Ambulatory Visit: Payer: Medicaid Other | Admitting: Urology

## 2021-07-18 VITALS — BP 122/71 | HR 54

## 2021-07-18 DIAGNOSIS — R350 Frequency of micturition: Secondary | ICD-10-CM | POA: Insufficient documentation

## 2021-07-18 DIAGNOSIS — R3915 Urgency of urination: Secondary | ICD-10-CM

## 2021-07-18 DIAGNOSIS — D72829 Elevated white blood cell count, unspecified: Secondary | ICD-10-CM

## 2021-07-18 DIAGNOSIS — R3129 Other microscopic hematuria: Secondary | ICD-10-CM | POA: Insufficient documentation

## 2021-07-18 DIAGNOSIS — Z122 Encounter for screening for malignant neoplasm of respiratory organs: Secondary | ICD-10-CM

## 2021-07-18 LAB — URINALYSIS, ROUTINE W REFLEX MICROSCOPIC
Bilirubin, UA: NEGATIVE
Glucose, UA: NEGATIVE
Ketones, UA: NEGATIVE
Leukocytes,UA: NEGATIVE
Nitrite, UA: NEGATIVE
Protein,UA: NEGATIVE
Specific Gravity, UA: 1.02 (ref 1.005–1.030)
Urobilinogen, Ur: 0.2 mg/dL (ref 0.2–1.0)
pH, UA: 5.5 (ref 5.0–7.5)

## 2021-07-18 LAB — MICROSCOPIC EXAMINATION
RBC, Urine: >30 /hpf — AB (ref 0–2)
Renal Epithel, UA: NONE SEEN /hpf

## 2021-07-18 LAB — CBC WITH DIFFERENTIAL/PLATELET
Abs Immature Granulocytes: 0.04 10*3/uL (ref 0.00–0.07)
Basophils Absolute: 0 10*3/uL (ref 0.0–0.1)
Basophils Relative: 0 %
Eosinophils Absolute: 0.1 10*3/uL (ref 0.0–0.5)
Eosinophils Relative: 1 %
HCT: 40.4 % (ref 36.0–46.0)
Hemoglobin: 13.4 g/dL (ref 12.0–15.0)
Immature Granulocytes: 0 %
Lymphocytes Relative: 37 %
Lymphs Abs: 3.5 10*3/uL (ref 0.7–4.0)
MCH: 30.6 pg (ref 26.0–34.0)
MCHC: 33.2 g/dL (ref 30.0–36.0)
MCV: 92.2 fL (ref 80.0–100.0)
Monocytes Absolute: 0.6 10*3/uL (ref 0.1–1.0)
Monocytes Relative: 6 %
Neutro Abs: 5.5 10*3/uL (ref 1.7–7.7)
Neutrophils Relative %: 56 %
Platelets: 306 10*3/uL (ref 150–400)
RBC: 4.38 MIL/uL (ref 3.87–5.11)
RDW: 14 % (ref 11.5–15.5)
WBC: 9.7 10*3/uL (ref 4.0–10.5)
nRBC: 0 % (ref 0.0–0.2)

## 2021-07-18 LAB — COMPREHENSIVE METABOLIC PANEL
ALT: 14 U/L (ref 0–44)
AST: 16 U/L (ref 15–41)
Albumin: 3.9 g/dL (ref 3.5–5.0)
Alkaline Phosphatase: 76 U/L (ref 38–126)
Anion gap: 7 (ref 5–15)
BUN: 10 mg/dL (ref 6–20)
CO2: 22 mmol/L (ref 22–32)
Calcium: 9 mg/dL (ref 8.9–10.3)
Chloride: 107 mmol/L (ref 98–111)
Creatinine, Ser: 0.77 mg/dL (ref 0.44–1.00)
GFR, Estimated: >60 mL/min (ref >60)
Glucose, Bld: 101 mg/dL — ABNORMAL HIGH (ref 70–99)
Potassium: 3.5 mmol/L (ref 3.5–5.1)
Sodium: 136 mmol/L (ref 135–145)
Total Bilirubin: 0.8 mg/dL (ref 0.3–1.2)
Total Protein: 6.7 g/dL (ref 6.5–8.1)

## 2021-07-18 LAB — FOLATE: Folate: 6.4 ng/mL (ref >5.9)

## 2021-07-18 LAB — VITAMIN B12: Vitamin B-12: 4089 pg/mL — ABNORMAL HIGH (ref 180–914)

## 2021-07-18 LAB — VITAMIN D 25 HYDROXY (VIT D DEFICIENCY, FRACTURES): Vit D, 25-Hydroxy: 39.27 ng/mL (ref 30–100)

## 2021-07-31 NOTE — Progress Notes (Unsigned)
[place], [place] [address]   CLINIC:  Medical Oncology/Hematology  PCP:  [name], [place] [address] [phone]   REASON FOR VISIT:  Follow-up for leukocytosis  PRIOR THERAPY: None  CURRENT THERAPY: Surveillance  INTERVAL HISTORY:  [name] 61 y.o. female returns for routine follow-up of her leukocytosis.  She was last seen by Dr. Delton Coombes on 01/20/2020, but was lost to follow-up at that time, although she had been recommended to follow-up in 6 months.  She returns to reestablish care so that she can have new prescriptions for her vitamin supplements.  At today's visit, she reports feeling fair.  No recent hospitalizations, surgeries, or changes in baseline health status.  Regarding her leukocytosis, she denies any current steroids.  She denies any recurrent infection, but reports strep throat infection several months ago.  She continues to smoke 1 pack/day cigarettes.  She denies any history of autoimmune disease.  She continues to take her B12, folate, and vitamin D supplements.  She denies any unexplained fevers or chills.  She has occasional night sweats associated with hot flashes.  She reports poor appetite and that she has been losing weight after she ran out of Boost.  Review of data shows approximate 5 to 6 pound weight loss in the past 6 months.  She complains of fatigue which has been ongoing ever since she had COVID-19 infection.  She has not noticed any new lumps or bumps.   Patient reports appetite at 40% and energy level low. She is maintaining stable weight at this time.  She has multiple other complaints unrelated to her visit which are further described in the ROS.   REVIEW OF SYSTEMS:  Review of Systems  Constitutional:  Positive for appetite change, fatigue and unexpected weight change. Negative for chills, diaphoresis and fever.  HENT:   Negative for lump/mass and nosebleeds.    Eyes:  Positive for eye problems.  Respiratory:  Positive for shortness of breath. Negative for cough and hemoptysis.   Cardiovascular:  Positive for chest pain (none today) and palpitations. Negative for leg swelling.  Gastrointestinal:  Negative for abdominal pain, blood in stool, constipation, diarrhea, nausea and vomiting.  Genitourinary:  Positive for hematuria.   Musculoskeletal:  Positive for arthralgias and back pain.  Skin: Negative.   Neurological:  Positive for numbness. Negative for dizziness, headaches and light-headedness.  Hematological:  Does not bruise/bleed easily.  Psychiatric/Behavioral:  The patient is nervous/anxious.       PAST MEDICAL/SURGICAL HISTORY:  Past Medical History:  Diagnosis Date   Anxiety    Arthritis    Back pain    COPD (chronic obstructive pulmonary disease) (HCC)    Depression    Dyspnea    Hip pain    HSV-1 infection    Hyperlipidemia    Right leg pain    Sciatica    Tobacco user    Past Surgical History:  Procedure Laterality Date   BUNIONECTOMY     CATARACT EXTRACTION     CESAREAN SECTION     COLONOSCOPY W/ POLYPECTOMY     ELBOW SURGERY     NOVASURE ABLATION     RHYTIDECTOMY NECK / CHEEK / CHIN     TONSILLECTOMY     TRIGGER FINGER RELEASE Right 10/24/2020   Procedure: RELEASE TRIGGER RIGHT THUMB;  Surgeon: Sherilyn Cooter, MD;  Location: [facility];  Service: Orthopedics;  Laterality: Right;   TUBAL LIGATION  SOCIAL HISTORY:  Social History   Socioeconomic History   Marital status: Divorced    Spouse name: Not on file   Number of children: 2   Years of education: Not on file   Highest education level: Not on file  Occupational History   Occupation: disabled  Tobacco Use   Smoking status: Every Day    Packs/day: 1.00    Types: Cigarettes    Start date: 01/23/1976   Smokeless tobacco: Never  Vaping Use   Vaping Use: Never used  Substance and Sexual Activity   Alcohol use: No   Drug use: No    Sexual activity: Yes  Other Topics Concern   Not on file  Social History Narrative   She lives with her children, she is a Materials engineer, 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    Social Determinants of Health   Financial Resource Strain: Not on file  Food Insecurity: Not on file  Transportation Needs: Not on file  Physical Activity: Not on file  Stress: Not on file  Social Connections: Not on file  Intimate Partner Violence: Not on file    FAMILY HISTORY:  Family History  Problem Relation Age of Onset   Brain cancer Mother    Cancer - Other Father        Abdominal    Colon cancer Sister    Colon cancer Paternal Grandmother     CURRENT MEDICATIONS:  Outpatient Encounter Medications as of 08/01/2021  Medication Sig   albuterol (PROVENTIL) (2.5 MG/3ML) 0.083% nebulizer solution Take 3 mLs (2.5 mg total) by nebulization every 6 (six) hours as needed for wheezing or shortness of breath.   aspirin EC 81 MG tablet Take 81 mg by mouth daily.   Cholecalciferol 25 MCG (1000 UT) capsule TAKE 2 SOFTGELS (2,000 UNITS TOTAL) BY MOUTH DAILY.   [employer] D3 25 MCG (1000 UT) capsule TAKE 2 SOFTGELS (2,000 UNITS TOTAL) BY MOUTH DAILY.   DULERA 200-5 MCG/ACT AERO INHALE 2 PUFFS BY MOUTH INTO THE LUNGS DAILY   folic acid (FOLVITE) 1 MG tablet TAKE 1 TABLET BY MOUTH EVERY DAY   gabapentin (NEURONTIN) 300 MG capsule TAKE 1 CAPSULE BY MOUTH THREE TIMES A DAY   HYDROcodone-acetaminophen (NORCO) 10-325 MG tablet Take 1 tablet by mouth every 6 (six) hours as needed.   hydrOXYzine (ATARAX/VISTARIL) 25 MG tablet Take 25 mg by mouth every 6 (six) hours as needed.   magic mouthwash w/lidocaine SOLN Take 10 mLs by mouth 3 (three) times daily as needed for mouth pain.   methocarbamol (ROBAXIN) 500 MG tablet Take by mouth.   mupirocin ointment (BACTROBAN) 2 % Apply 1 application  topically 2 (two) times daily.   polyethylene glycol powder (GLYCOLAX/MIRALAX) 17 GM/SCOOP powder TAKE 17 G BY MOUTH 2 (TWO) TIMES DAILY AS  NEEDED. (NEEDS TO BE SEEN BEFORE NEXT REFILL)   rosuvastatin (CRESTOR) 5 MG tablet Take 1 tablet (5 mg total) by mouth daily.   traZODone (DESYREL) 100 MG tablet TAKE 1-1.5 TABLETS (100-150 MG TOTAL) BY MOUTH AT BEDTIME.   triamcinolone ointment (KENALOG) 0.5 % APPLY TO AFFECTED AREA TWICE A DAY   ZTLIDO 1.8 % PTCH Apply 1 patch topically daily.   No facility-administered encounter medications on file as of 08/01/2021.    ALLERGIES:  Allergies  Allergen Reactions   Bee Venom Anaphylaxis   Codeine Itching   Meperidine Hcl Other (See Comments)    Makes her feel strange   Sulfa Antibiotics Other (See Comments)  Joint pain   Doxycycline Swelling and Dermatitis    Burning, red skin when she takes it.     PHYSICAL EXAM:  ECOG PERFORMANCE STATUS: 1 - Symptomatic but completely ambulatory  There were no vitals filed for this visit. There were no vitals filed for this visit. Physical Exam Constitutional:      Appearance: Normal appearance.  HENT:     Head: Normocephalic and atraumatic.     Mouth/Throat:     Mouth: Mucous membranes are moist.  Eyes:     Extraocular Movements: Extraocular movements intact.     Pupils: Pupils are equal, round, and reactive to light.  Cardiovascular:     Rate and Rhythm: Regular rhythm. Bradycardia present.     Pulses: Normal pulses.     Heart sounds: Normal heart sounds.  Pulmonary:     Effort: Pulmonary effort is normal.     Breath sounds: Normal breath sounds.  Abdominal:     General: Bowel sounds are normal.     Palpations: Abdomen is soft.     Tenderness: There is no abdominal tenderness.  Musculoskeletal:        General: No swelling.     Right lower leg: No edema.     Left lower leg: No edema.  Lymphadenopathy:     Cervical: No cervical adenopathy.  Skin:    General: Skin is warm and dry.  Neurological:     General: No focal deficit present.     Mental Status: She is alert and oriented to person, place, and time.  Psychiatric:         Mood and Affect: Mood normal.        Behavior: Behavior normal.      LABORATORY DATA:  I have reviewed the labs as listed.  CBC    Component Value Date/Time   WBC 9.7 07/18/2021 1133   RBC 4.38 07/18/2021 1133   HGB 13.4 07/18/2021 1133   HGB 15.1 12/08/2020 1013   HCT 40.4 07/18/2021 1133   HCT 43.7 12/08/2020 1013   PLT 306 07/18/2021 1133   PLT 430 12/08/2020 1013   MCV 92.2 07/18/2021 1133   MCV 92 12/08/2020 1013   MCH 30.6 07/18/2021 1133   MCHC 33.2 07/18/2021 1133   RDW 14.0 07/18/2021 1133   RDW 13.1 12/08/2020 1013   LYMPHSABS 3.5 07/18/2021 1133   LYMPHSABS 5.1 (H) 12/08/2020 1013   MONOABS 0.6 07/18/2021 1133   EOSABS 0.1 07/18/2021 1133   EOSABS 0.1 12/08/2020 1013   BASOSABS 0.0 07/18/2021 1133   BASOSABS 0.1 12/08/2020 1013      Latest Ref Rng & Units 07/18/2021   11:33 AM 06/23/2021   10:27 AM 12/08/2020   10:13 AM  CMP  Glucose 70 - 99 mg/dL 101  99  74   BUN 6 - 20 mg/dL '10  7  11   '$ Creatinine 0.44 - 1.00 mg/dL 0.77  0.84  0.84   Sodium 135 - 145 mmol/L 136  140  135   Potassium 3.5 - 5.1 mmol/L 3.5  4.0  4.6   Chloride 98 - 111 mmol/L 107  105  98   CO2 22 - 32 mmol/L '22  21  22   '$ Calcium 8.9 - 10.3 mg/dL 9.0  9.6  9.7   Total Protein 6.5 - 8.1 g/dL 6.7  5.9  7.1   Total Bilirubin 0.3 - 1.2 mg/dL 0.8  0.3  0.4   Alkaline Phos 38 - 126 U/L 76  86  104   AST 15 - 41 U/L '16  14  12   '$ ALT 0 - 44 U/L '14  14  9     '$ DIAGNOSTIC IMAGING:  I have independently reviewed the relevant imaging and discussed with the patient.  ASSESSMENT & PLAN: 1.  Leukocytosis, reactive - History of chronic intermittent leukocytosis since 2008, both lymphocytic and granulocytic - BCR/ABL, JAK2 V617F, and flow cytometry were negative.  - Current active smoker, 1 PPD x 34 years  - She has occasional night sweats and hot flashes.  She reports poor appetite with associated weight loss, especially since she ran out of Boost. - Most recent CBC (07/18/2021): WBC 9.7 with  normal Hgb, platelets, differential - No unexplained fevers or chills. - PLAN: Leukocytosis has resolved, MPN work-up negative.  No concern for malignant leukocytosis at this time.  Suspect reactive leukocytosis in the setting of tobacco use.  She can continue to follow with her primary care for this, but we will discharge her from hematology/oncology clinic at this time.  She will likely have recurrent leukocytosis in the future, but does not need to see [place] again unless she has persistent WBC >20.0 or develops unexplained B symptoms.  2.  Tobacco use - She currently smokes 1 PPD x34 years - She is enrolled in lung cancer screening program for annual low-dose CT chest - Most recent LDCT chest (04/12/2021): Lung RADS 2, benign appearance/behavior - PLAN: Smoking cessation information provided.  Continue annual LDCT chest as part of LCS program.  3.  Vitamin B12 deficiency - Most recent vitamin B12 (07/18/2021) elevated at 4089.  (Vitamin B12 was low at 120 in December 2021) - PLAN: Refill prescription for vitamin B12 500 mcg daily sent to pharmacy.  4.  Vitamin D deficiency - Most recent vitamin D (07/18/2021) is normal at 39.27 - PLAN: Refill prescription for vitamin D 2000 units daily sent to pharmacy.  5.  Folic acid deficiency - Most recent folate (07/18/2021) is normal at 6.4 - PLAN: Refill prescription for folate 400 mcg daily sent to pharmacy.   PLAN SUMMARY & DISPOSITION: Discharge from clinic. Recommend follow-up with PCP for ongoing chronic issues and other concerns.  All questions were answered. The patient knows to call the clinic with any problems, questions or concerns.  Medical decision making: Moderate  Time spent on visit: I spent 20 minutes counseling the patient face to face. The total time spent in the appointment was 30 minutes and more than 50% was on counseling.   Harriett Rush, PA-C  08/01/2021 12:15 PM

## 2021-08-01 ENCOUNTER — Inpatient Hospital Stay (HOSPITAL_COMMUNITY): Payer: Medicaid Other | Attending: Physician Assistant | Admitting: Physician Assistant

## 2021-08-01 ENCOUNTER — Other Ambulatory Visit: Payer: Self-pay | Admitting: Family

## 2021-08-01 ENCOUNTER — Other Ambulatory Visit (HOSPITAL_COMMUNITY): Payer: Self-pay | Admitting: Hematology

## 2021-08-01 VITALS — BP 103/74 | HR 57 | Temp 98.3°F | Resp 18 | Wt 141.9 lb

## 2021-08-01 DIAGNOSIS — R61 Generalized hyperhidrosis: Secondary | ICD-10-CM | POA: Diagnosis not present

## 2021-08-01 DIAGNOSIS — F17210 Nicotine dependence, cigarettes, uncomplicated: Secondary | ICD-10-CM | POA: Insufficient documentation

## 2021-08-01 DIAGNOSIS — E559 Vitamin D deficiency, unspecified: Secondary | ICD-10-CM | POA: Insufficient documentation

## 2021-08-01 DIAGNOSIS — D72829 Elevated white blood cell count, unspecified: Secondary | ICD-10-CM | POA: Diagnosis not present

## 2021-08-01 DIAGNOSIS — R634 Abnormal weight loss: Secondary | ICD-10-CM | POA: Diagnosis not present

## 2021-08-01 DIAGNOSIS — R232 Flushing: Secondary | ICD-10-CM | POA: Insufficient documentation

## 2021-08-01 DIAGNOSIS — E538 Deficiency of other specified B group vitamins: Secondary | ICD-10-CM | POA: Insufficient documentation

## 2021-08-01 DIAGNOSIS — Z87891 Personal history of nicotine dependence: Secondary | ICD-10-CM

## 2021-08-01 DIAGNOSIS — R630 Anorexia: Secondary | ICD-10-CM

## 2021-08-01 MED ORDER — VITAMIN D3 50 MCG (2000 UT) PO CAPS: 2000.0000 [IU] | ORAL_CAPSULE | Freq: Every day | ORAL | 3 refills | Status: DC

## 2021-08-01 MED ORDER — FOLIC ACID 400 MCG PO TABS: 400.0000 ug | ORAL_TABLET | Freq: Every day | ORAL | 3 refills | Status: DC

## 2021-08-01 MED ORDER — CYANOCOBALAMIN 500 MCG PO TABS: 500.0000 ug | ORAL_TABLET | Freq: Every day | ORAL | 3 refills | Status: AC

## 2021-08-01 NOTE — Telephone Encounter (Signed)
Did you want this refilled?

## 2021-08-01 NOTE — Patient Instructions (Addendum)
[place] at [facility] Discharge Instructions  You were seen today by Tarri Abernethy PA-C for your elevated white blood cells and other conditions as described below:  ELEVATED WHITE BLOOD CELLS: Your blood cells have returned to normal, but may become elevated in the future.  We have checked labs that were NEGATIVE for any signs of blood cancer.  Your white blood cells are most likely reactive from the inflammation caused by your cigarette smoking.  TOBACCO USE: Please see the attached handout for information regarding smoking cessation.  You will continue to receive annual CT scans of your chest to screen for lung cancer.  VITAMIN B-12: Prescription sent to pharmacy.  Take vitamin B12 500 daily.  VITAMIN D DEFICIENCY: Continue to take vitamin D 2000 units weekly.  Prescription has been sent to your pharmacy.  FOLATE ACID DEFICIENCY: Take folate 400 mcg daily.  Prescription has been sent to your pharmacy.  WEIGHT LOSS: We will see if we can write a prescription for Boost nutritional supplement.  You will need to follow-up with your primary care doctor for any further needs.  At this time, since your white blood cells are stable and have been shown to be noncancerous, we will discharge you from the cancer center and hematology clinic.  You do not need to follow-up with our office in the future.    Your primary care doctor should be able to continue to monitor and treat the above vitamin and mineral deficiencies.  Your primary care doctor will need to be the one to prescribe any Boost supplements in the future.   - - - - - - - - - - - - - - - - - -    Thank you for choosing [place] at [facility] to provide your oncology and hematology care.  To afford each patient quality time with our provider, please arrive at least 15 minutes before your scheduled appointment time.   If you have a lab appointment with the [facility] please come  in thru the Main Entrance and check in at the main information desk.  You need to re-schedule your appointment should you arrive 10 or more minutes late.  We strive to give you quality time with our providers, and arriving late affects you and other patients whose appointments are after yours.  Also, if you no show three or more times for appointments you may be dismissed from the clinic at the providers discretion.     Again, thank you for choosing [facility].  Our hope is that these requests will decrease the amount of time that you wait before being seen by our physicians.       _____________________________________________________________  Should you have questions after your visit to [facility], please contact our office at [phone] and follow the prompts.  Our office hours are 8:00 a.m. and 4:30 p.m. Monday - Friday.  Please note that voicemails left after 4:00 p.m. may not be returned until the following business day.  We are closed weekends and major holidays.  You do have access to a nurse 24-7, just call the main number to the clinic [phone] and do not press any options, hold on the line and a nurse will answer the phone.    For prescription refill requests, have your pharmacy contact our office and allow 72 hours.    Due to Covid, you will need to wear a mask upon entering  the hospital. If you do not have a mask, a mask will be given to you at the Main Entrance upon arrival. For doctor visits, patients may have 1 support person age 45 or older with them. For treatment visits, patients can not have anyone with them due to social distancing guidelines and our immunocompromised population.

## 2021-08-02 DIAGNOSIS — H5213 Myopia, bilateral: Secondary | ICD-10-CM | POA: Diagnosis not present

## 2021-08-09 ENCOUNTER — Ambulatory Visit (HOSPITAL_COMMUNITY)
Admission: RE | Admit: 2021-08-09 | Discharge: 2021-08-09 | Disposition: A | Discharge status: Home / Self Care | Payer: Medicaid Other | Source: Ambulatory Visit | Attending: Urology | Admitting: Urology

## 2021-08-09 DIAGNOSIS — R3129 Other microscopic hematuria: Secondary | ICD-10-CM | POA: Insufficient documentation

## 2021-08-09 MED ORDER — IOHEXOL 300 MG/ML  SOLN
100.0000 mL | Freq: Once | INTRAMUSCULAR | Status: AC | PRN
  Administered 2021-08-09: 100 mL via INTRAVENOUS

## 2021-08-16 ENCOUNTER — Telehealth: Payer: Self-pay

## 2021-08-16 NOTE — Telephone Encounter (Signed)
FYI patient called to cancel her apt on 08/01.  I informed her that you would be going over her CT results and possible Cysto.  She states she will keep the apt for the results but does not want to proceed with the cysto.  I will keep the apt as is until you discuss this further with the pt at apt.

## 2021-08-20 IMAGING — DX DG CHEST 2V
2 series · 2 of 2 positions shown · non-contrast
Comparison: 03/26/2019

CLINICAL DATA: Shortness of breath and dizziness for 4 weeks.
Fatigue.

EXAM:
CHEST - 2 VIEW

[chest pa]
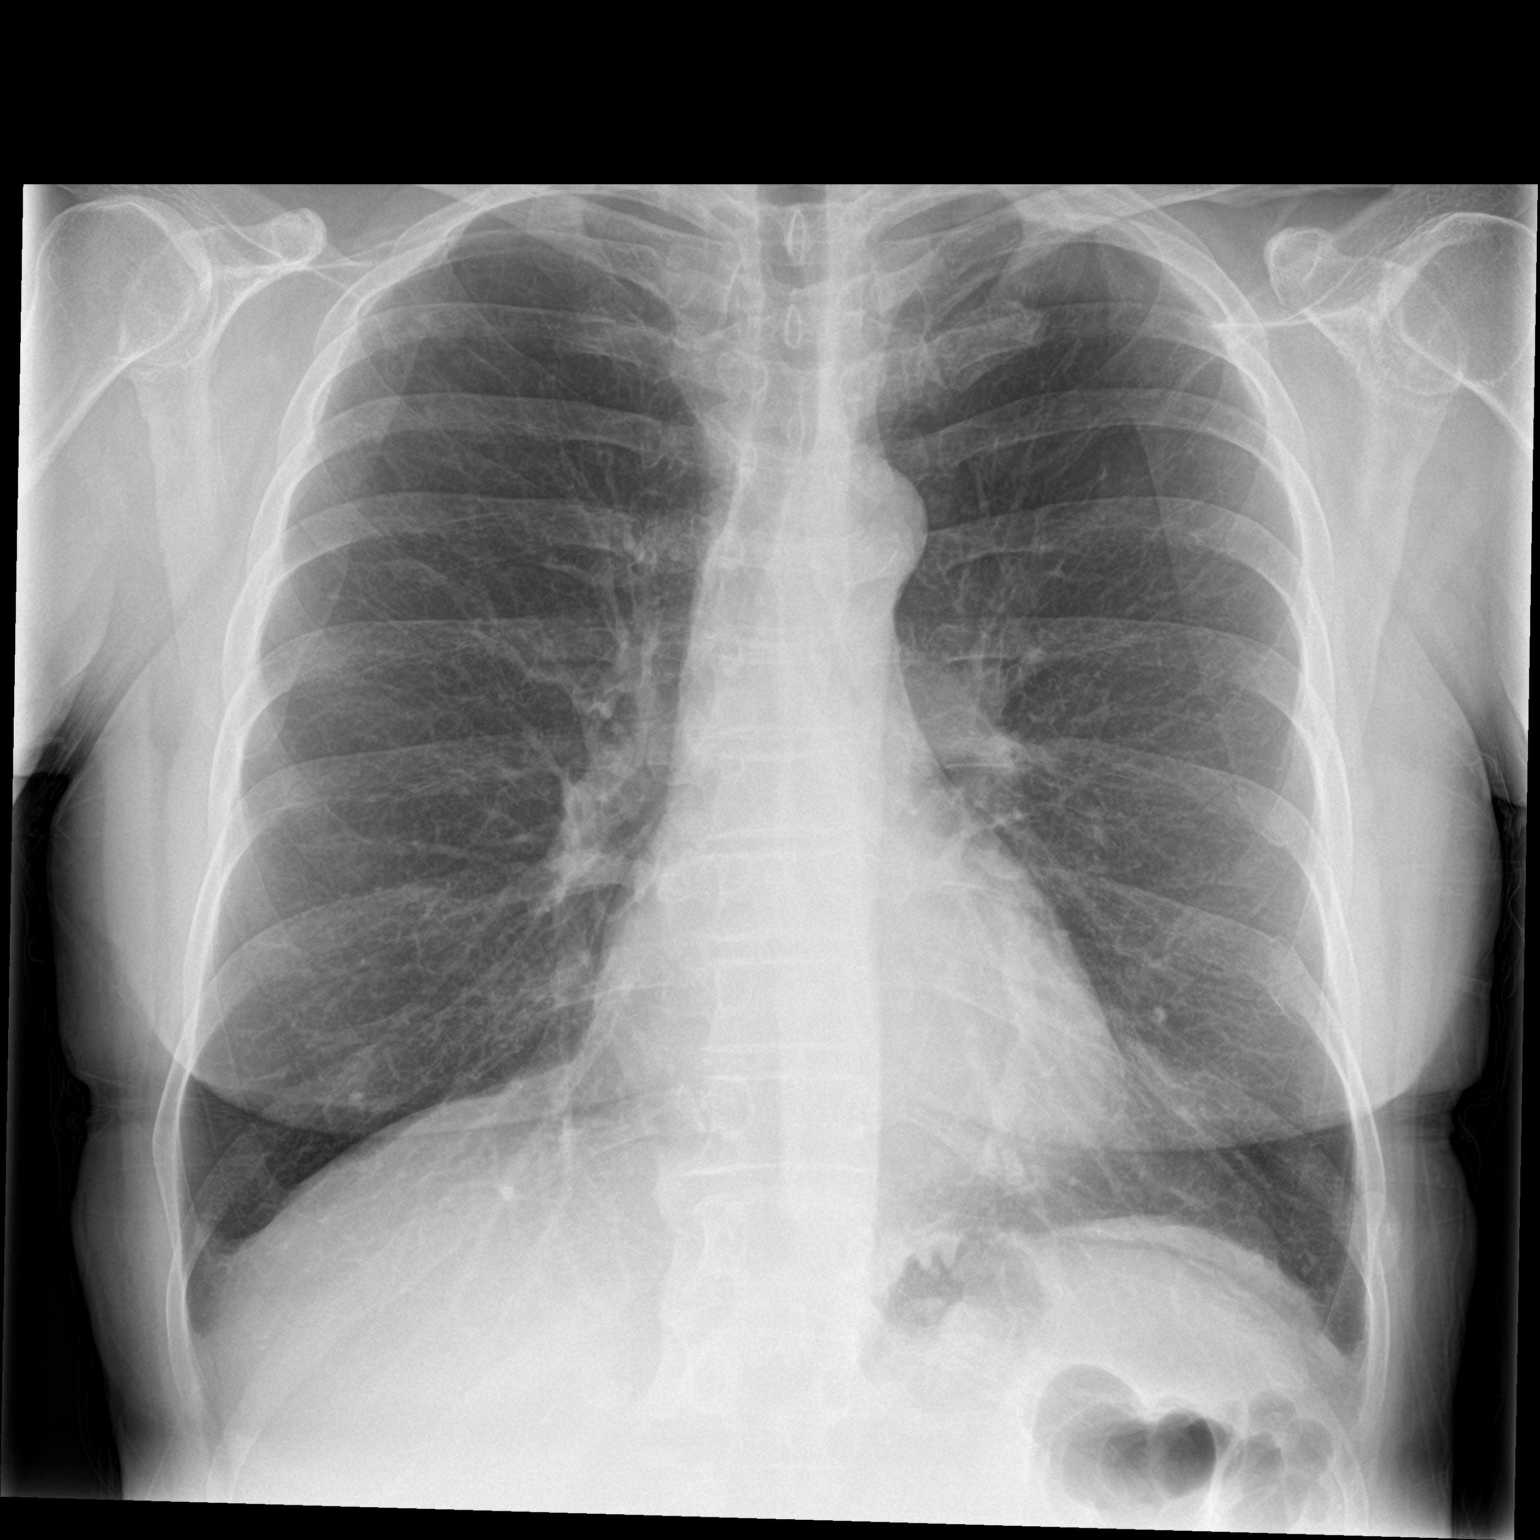

[chest lat]
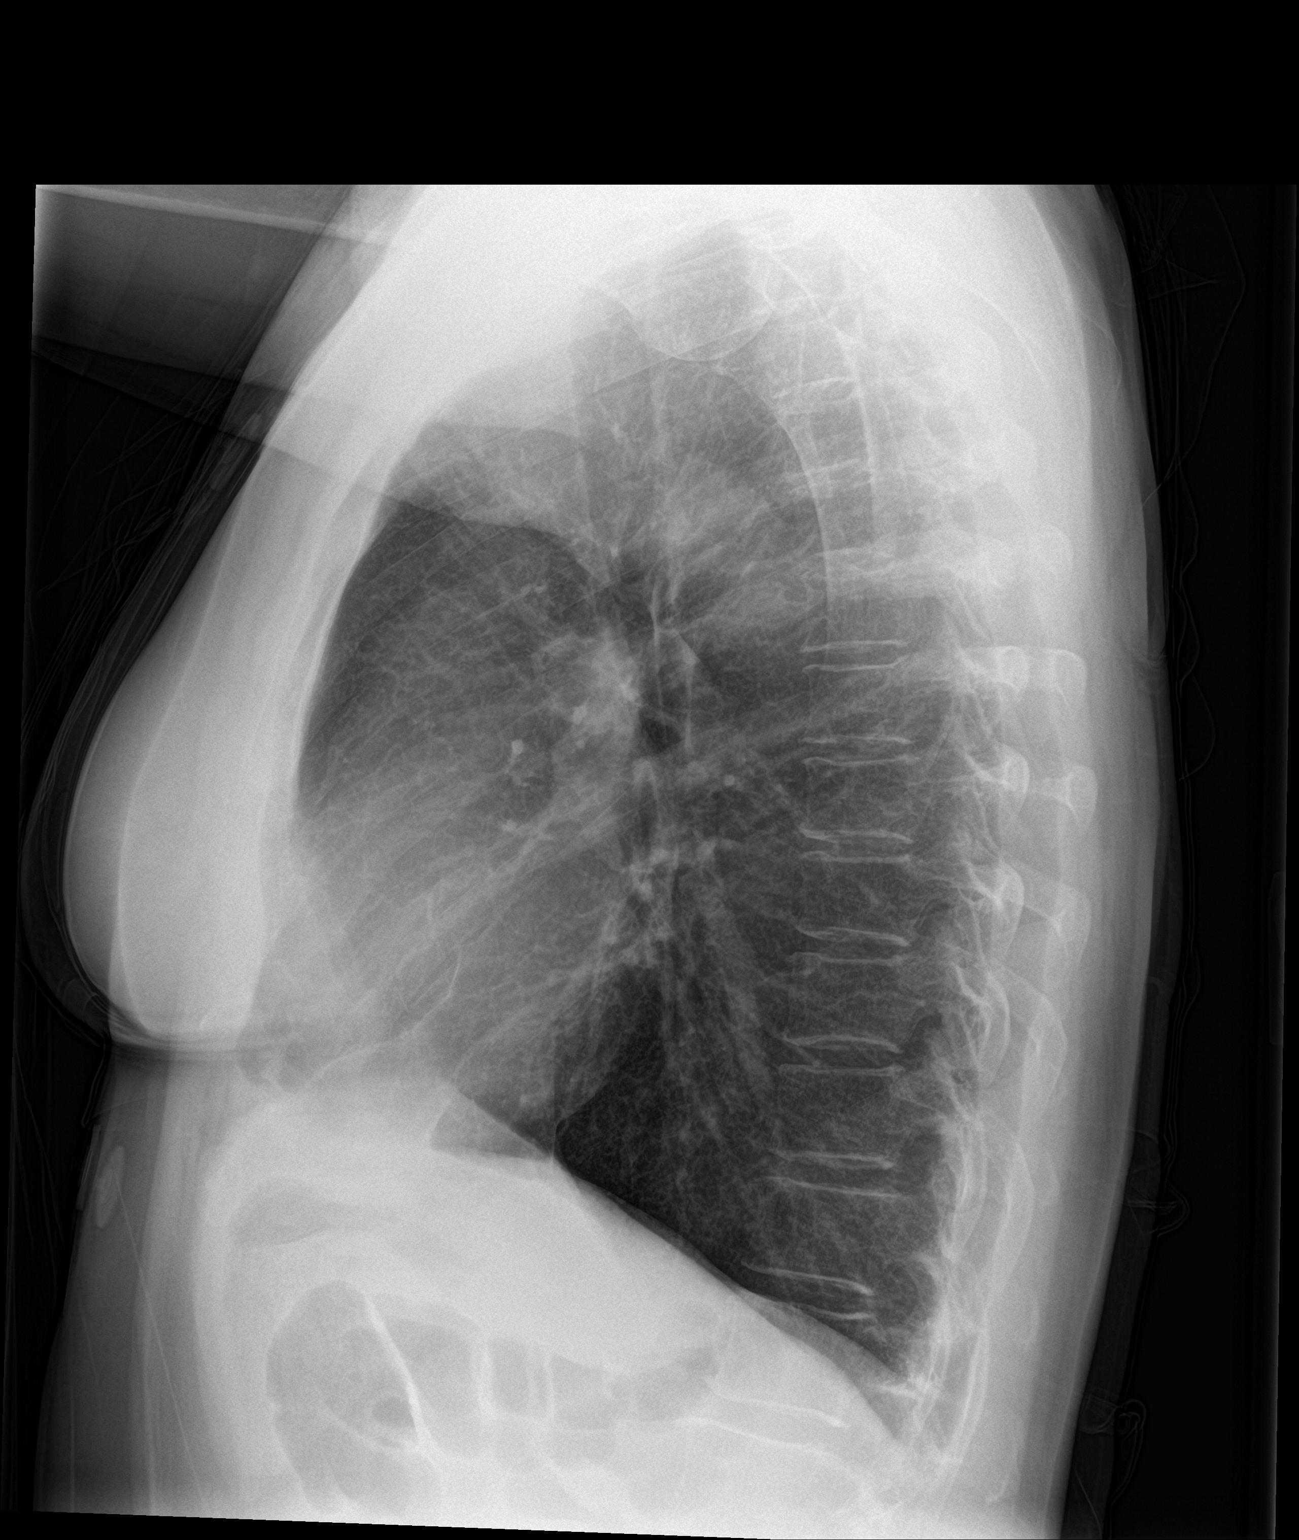

[2 of 2 positions shown; findings below may reference images not displayed]

FINDINGS: The heart size and mediastinal contours are within normal limits.
Both lungs are clear. A few old left rib fracture deformities are
again noted.
IMPRESSION: No active cardiopulmonary disease.

## 2021-08-20 NOTE — Progress Notes (Signed)
History of Present Illness: [name] is a 61 y.o. year old female sent by [name], NP for evaluation and management of persistent microscopic hematuria as well as urinary frequency and urgency.  She is a lifelong smoker.  She denies any gross hematuria.  She does drink significant amounts of cola beverages, for many years.  She has bothersome urinary frequency and urgency.  Rare urgency incontinence.  She is nondiabetic.  There is no history of kidney stones.  She has no associated flank or abdominal pain.  Past Medical History:  Diagnosis Date   Anxiety    Arthritis    Back pain    COPD (chronic obstructive pulmonary disease) (HCC)    Depression    Dyspnea    Hip pain    HSV-1 infection    Hyperlipidemia    Right leg pain    Sciatica    Tobacco user     Past Surgical History:  Procedure Laterality Date   BUNIONECTOMY     CATARACT EXTRACTION     CESAREAN SECTION     COLONOSCOPY W/ POLYPECTOMY     ELBOW SURGERY     NOVASURE ABLATION     RHYTIDECTOMY NECK / CHEEK / CHIN     TONSILLECTOMY     TRIGGER FINGER RELEASE Right 10/24/2020   Procedure: RELEASE TRIGGER RIGHT THUMB;  Surgeon: Sherilyn Cooter, MD;  Location: [facility];  Service: Orthopedics;  Laterality: Right;   TUBAL LIGATION      Home Medications:  Allergies as of 08/22/2021       Reactions   Bee Venom Anaphylaxis   Codeine Itching   Meperidine Hcl Other (See Comments)   Makes her feel strange   Sulfa Antibiotics Other (See Comments)   Joint pain   Doxycycline Swelling, Dermatitis   Burning, red skin when she takes it.        Medication List        Accurate as of August 20, 2021  8:43 PM. If you have any questions, ask your nurse or doctor.          albuterol (2.5 MG/3ML) 0.083% nebulizer solution Commonly known as: PROVENTIL Take 3 mLs (2.5 mg total) by nebulization every 6 (six) hours as needed for wheezing or shortness of breath.   aspirin EC 81 MG tablet Take 81 mg  by mouth daily.   cyanocobalamin 500 MCG tablet Commonly known as: VITAMIN B12 Take 1 tablet (500 mcg total) by mouth daily.   Dulera 200-5 MCG/ACT Aero Generic drug: mometasone-formoterol INHALE 2 PUFFS BY MOUTH INTO THE LUNGS DAILY   folic acid 841 MCG tablet Commonly known as: FOLVITE Take 1 tablet (400 mcg total) by mouth daily.   gabapentin 300 MG capsule Commonly known as: NEURONTIN TAKE 1 CAPSULE BY MOUTH THREE TIMES A DAY   HYDROcodone-acetaminophen 10-325 MG tablet Commonly known as: NORCO Take 1 tablet by mouth every 6 (six) hours as needed.   hydrOXYzine 25 MG tablet Commonly known as: ATARAX Take 25 mg by mouth every 6 (six) hours as needed.   magic mouthwash w/lidocaine Soln Take 10 mLs by mouth 3 (three) times daily as needed for mouth pain.   methocarbamol 500 MG tablet Commonly known as: ROBAXIN Take by mouth.   mupirocin ointment 2 % Commonly known as: BACTROBAN Apply 1 application  topically 2 (two) times daily.   polyethylene glycol powder 17 GM/SCOOP powder Commonly known as: GLYCOLAX/MIRALAX TAKE 17 G BY MOUTH 2 (TWO) TIMES DAILY AS  NEEDED. (NEEDS TO BE SEEN BEFORE NEXT REFILL)   rosuvastatin 5 MG tablet Commonly known as: Crestor Take 1 tablet (5 mg total) by mouth daily.   Sodium Fluoride 5000 PPM 1.1 % Gel dental gel Generic drug: sodium fluoride Take by mouth as directed.   traZODone 100 MG tablet Commonly known as: DESYREL TAKE 1-1.5 TABLETS (100-150 MG TOTAL) BY MOUTH AT BEDTIME.   triamcinolone ointment 0.5 % Commonly known as: KENALOG APPLY TO AFFECTED AREA TWICE A DAY   Vitamin D3 50 MCG (2000 UT) capsule Take 1 capsule (2,000 Units total) by mouth daily.   ZTlido 1.8 % Ptch Generic drug: Lidocaine Apply 1 patch topically daily.        Allergies:  Allergies  Allergen Reactions   Bee Venom Anaphylaxis   Codeine Itching   Meperidine Hcl Other (See Comments)    Makes her feel strange   Sulfa Antibiotics Other (See  Comments)    Joint pain   Doxycycline Swelling and Dermatitis    Burning, red skin when she takes it.    Family History  Problem Relation Age of Onset   Brain cancer Mother    Cancer - Other Father        Abdominal    Colon cancer Sister    Colon cancer Paternal Grandmother     Social History:  reports that she has been smoking cigarettes. She started smoking about 45 years ago. She has been smoking an average of 1 pack per day. She has never used smokeless tobacco. She reports that she does not drink alcohol and does not use drugs.  ROS: A complete review of systems was performed.  All systems are negative except for pertinent findings as noted.  Physical Exam:  Vital signs in last 24 hours: There were no vitals taken for this visit. Constitutional:  Alert and oriented, No acute distress Cardiovascular: Regular rate  Respiratory: Normal respiratory effort GI: Abdomen is soft, nontender, nondistended, no abdominal masses. No CVAT.  Neurologic: Grossly intact, no focal deficits Psychiatric: Normal mood and affect  I have reviewed prior pt notes  I have reviewed notes from referring/previous physicians  I have reviewed urinalysis results  I have independently reviewed prior imaging  I have reviewed prior PSA results  I have reviewed prior urine culture   Impression/Assessment:  ***  Plan:  ***

## 2021-08-22 ENCOUNTER — Encounter: Payer: Self-pay | Admitting: Urology

## 2021-08-22 ENCOUNTER — Ambulatory Visit: Payer: Medicaid Other | Admitting: Urology

## 2021-08-22 VITALS — BP 131/58 | HR 53 | Ht 63.0 in | Wt 145.0 lb

## 2021-08-22 DIAGNOSIS — R3129 Other microscopic hematuria: Secondary | ICD-10-CM | POA: Diagnosis not present

## 2021-08-22 DIAGNOSIS — R350 Frequency of micturition: Secondary | ICD-10-CM

## 2021-08-22 DIAGNOSIS — R3915 Urgency of urination: Secondary | ICD-10-CM

## 2021-08-22 LAB — URINALYSIS, ROUTINE W REFLEX MICROSCOPIC
Bilirubin, UA: NEGATIVE
Glucose, UA: NEGATIVE
Ketones, UA: NEGATIVE
Leukocytes,UA: NEGATIVE
Nitrite, UA: NEGATIVE
Specific Gravity, UA: 1.02 (ref 1.005–1.030)
Urobilinogen, Ur: 1 mg/dL (ref 0.2–1.0)
pH, UA: 5.5 (ref 5.0–7.5)

## 2021-08-22 LAB — MICROSCOPIC EXAMINATION: Renal Epithel, UA: NONE SEEN /hpf

## 2021-09-16 DIAGNOSIS — J029 Acute pharyngitis, unspecified: Secondary | ICD-10-CM | POA: Diagnosis not present

## 2021-09-16 DIAGNOSIS — J069 Acute upper respiratory infection, unspecified: Secondary | ICD-10-CM | POA: Diagnosis not present

## 2021-09-16 DIAGNOSIS — R059 Cough, unspecified: Secondary | ICD-10-CM | POA: Diagnosis not present

## 2021-09-18 DIAGNOSIS — M5416 Radiculopathy, lumbar region: Secondary | ICD-10-CM | POA: Diagnosis not present

## 2021-09-18 DIAGNOSIS — M542 Cervicalgia: Secondary | ICD-10-CM | POA: Diagnosis not present

## 2021-10-05 ENCOUNTER — Other Ambulatory Visit: Payer: Self-pay | Admitting: Family

## 2021-10-09 DIAGNOSIS — H6982 Other specified disorders of Eustachian tube, left ear: Secondary | ICD-10-CM | POA: Diagnosis not present

## 2021-11-09 ENCOUNTER — Other Ambulatory Visit: Payer: Self-pay | Admitting: Family

## 2021-11-15 ENCOUNTER — Other Ambulatory Visit: Payer: Self-pay | Admitting: Family

## 2021-11-15 IMAGING — CT CT CHEST LUNG CANCER SCREENING LOW DOSE W/O CM
1 of 2 series · 10 of 20 positions shown, 13 images · non-contrast
Comparison: None.

CLINICAL DATA: 59-year-old female current smoker, with 32 pack-year
history of smoking, for initial lung cancer screening

EXAM:
CT CHEST WITHOUT CONTRAST LOW-DOSE FOR LUNG CANCER SCREENING
TECHNIQUE: Multidetector CT imaging of the chest was performed following the
standard protocol without IV contrast.

[ct lung segmentation data · axial · 0.67mm/px · z∈[-152,-152]mm · 10 of 335 frames shown]
[frame 1/335  mediastinal]
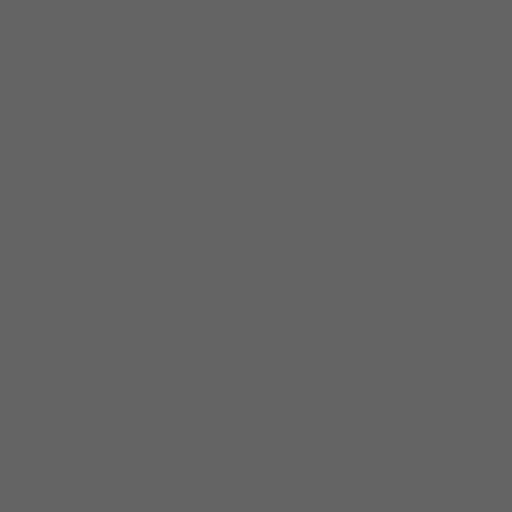
[frame 1/335  lung]
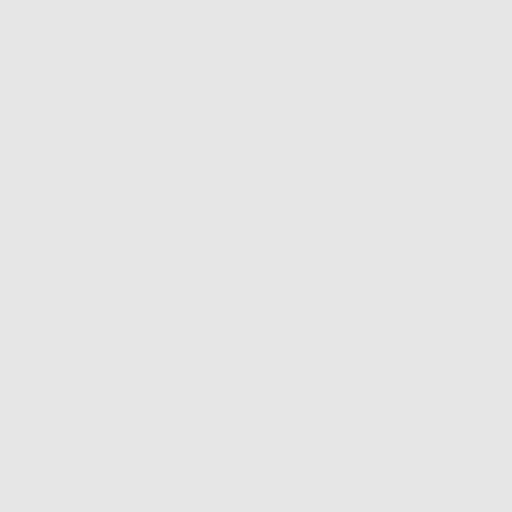
[frame 38/335  lung]
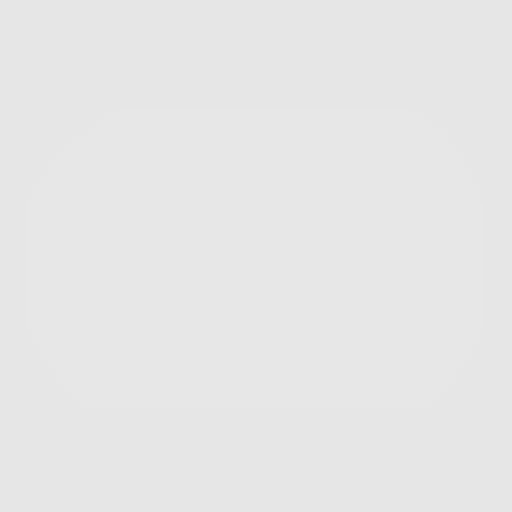
[frame 75/335  lung]
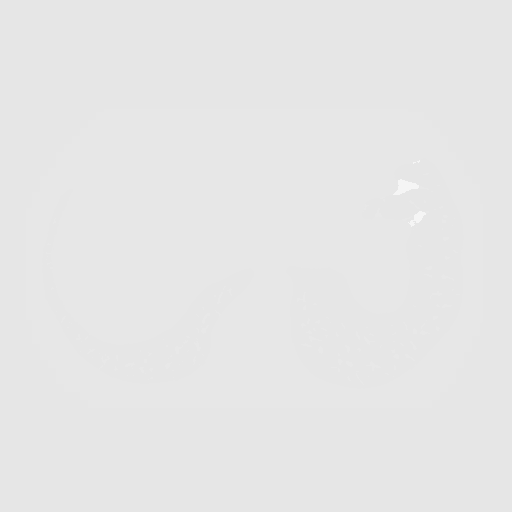
[frame 112/335  lung]
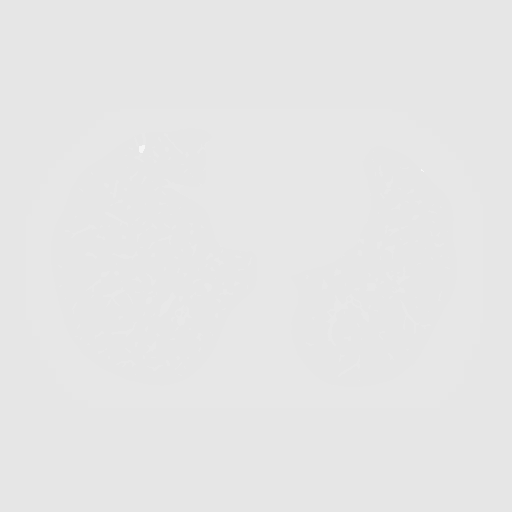
[frame 149/335  mediastinal]
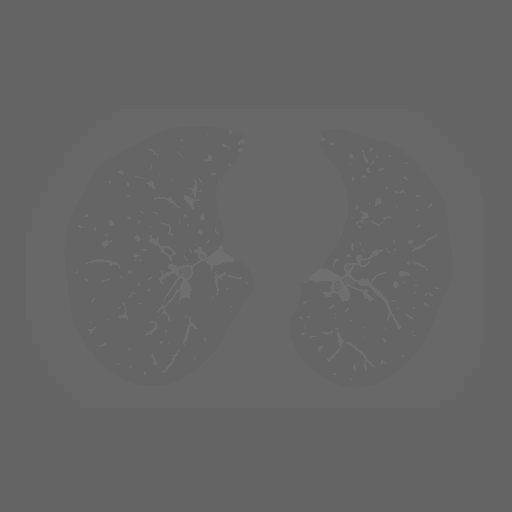
[frame 149/335  lung]
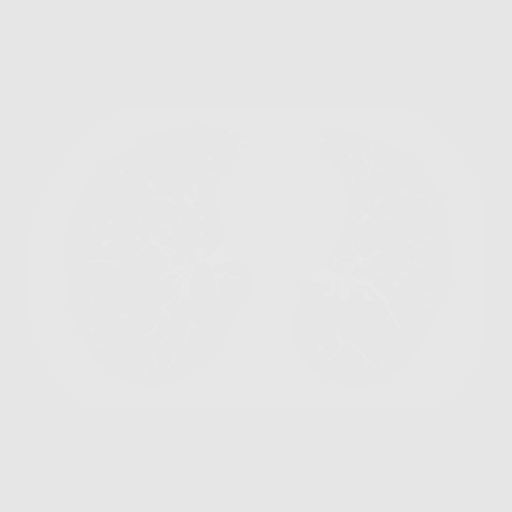
[frame 186/335  lung]
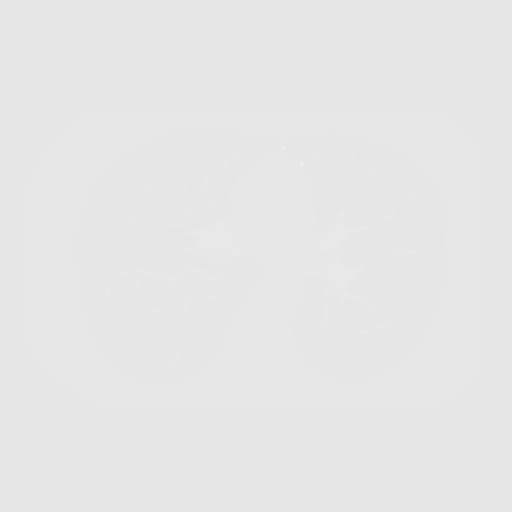
[frame 223/335  lung]
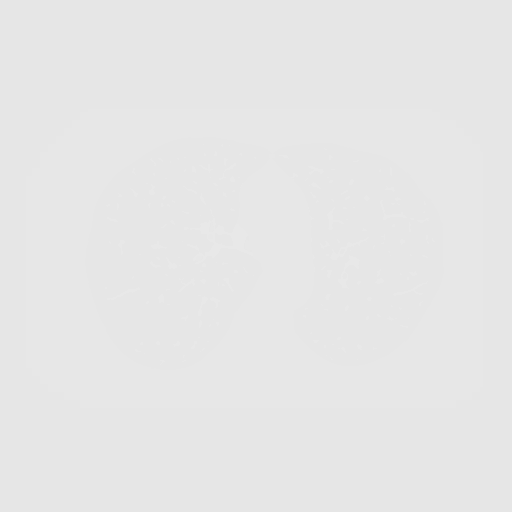
[frame 260/335  lung]
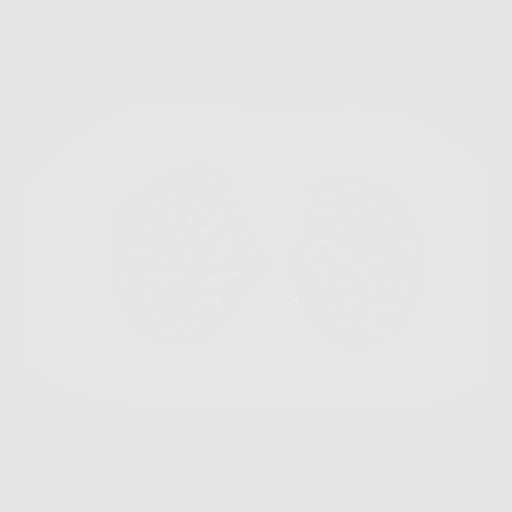
[frame 297/335  mediastinal]
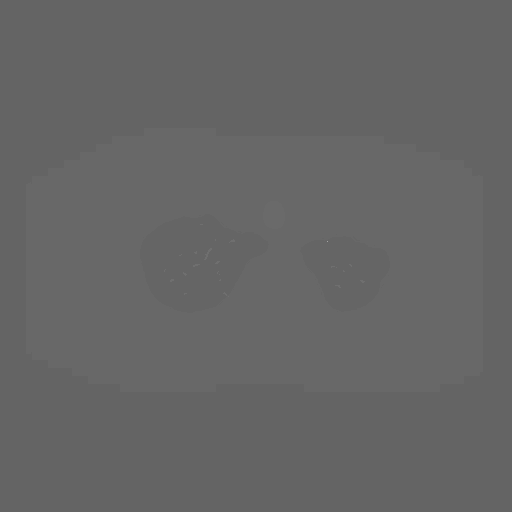
[frame 297/335  lung]
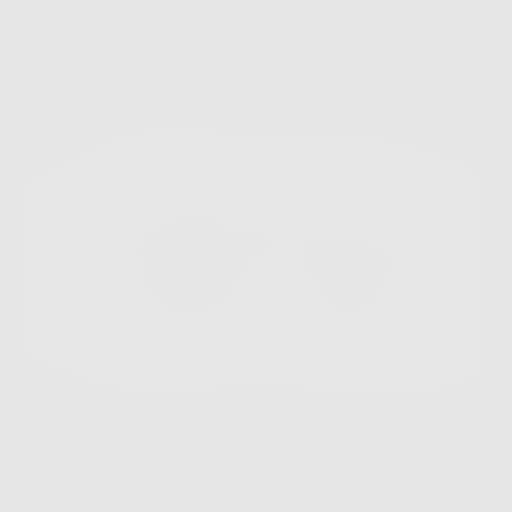
[frame 335/335  lung]
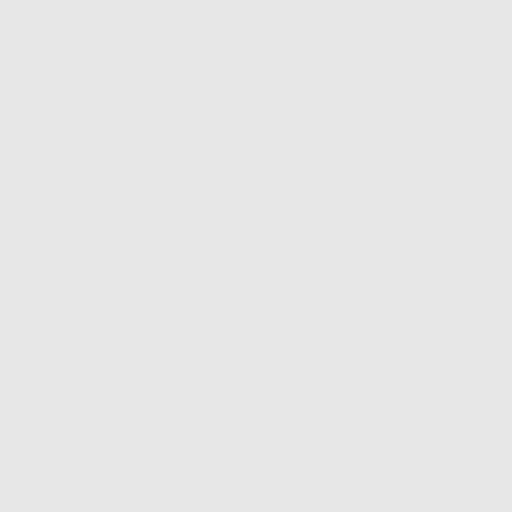

[10 of 20 positions shown; findings below may reference images not displayed]

FINDINGS: Cardiovascular: The heart is normal in size. No pericardial
effusion.

No evidence of thoracic aortic aneurysm. Mild atherosclerotic
calcifications of the aortic arch.

Mediastinum/Nodes: No suspicious mediastinal lymphadenopathy.

Visualized thyroid is unremarkable.

Lungs/Pleura: Biapical pleural-parenchymal scarring.

Moderate centrilobular and paraseptal emphysematous changes, upper
lung predominant.

Mild scarring/atelectasis inferiorly in the right middle lobe.

No focal consolidation.

3.2 mm subpleural nodule in the posterior right lower lobe.

No pleural effusion or pneumothorax.

Upper Abdomen: Visualized upper abdomen is grossly unremarkable.

Musculoskeletal: Visualized osseous structures are within normal
limits.
IMPRESSION: Lung-RADS 2, benign appearance or behavior. Continue annual
screening with low-dose chest CT without contrast in 12 months.

Aortic Atherosclerosis (7OEGB-1GU.U) and Emphysema (7OEGB-ALJ.C).

## 2021-11-18 DIAGNOSIS — M25561 Pain in right knee: Secondary | ICD-10-CM | POA: Diagnosis not present

## 2021-11-18 DIAGNOSIS — Z760 Encounter for issue of repeat prescription: Secondary | ICD-10-CM | POA: Diagnosis not present

## 2021-11-18 DIAGNOSIS — F419 Anxiety disorder, unspecified: Secondary | ICD-10-CM | POA: Diagnosis not present

## 2021-11-18 DIAGNOSIS — Z862 Personal history of diseases of the blood and blood-forming organs and certain disorders involving the immune mechanism: Secondary | ICD-10-CM | POA: Diagnosis not present

## 2021-11-18 DIAGNOSIS — G8929 Other chronic pain: Secondary | ICD-10-CM | POA: Diagnosis not present

## 2021-11-18 DIAGNOSIS — R5383 Other fatigue: Secondary | ICD-10-CM | POA: Diagnosis not present

## 2021-11-18 DIAGNOSIS — R110 Nausea: Secondary | ICD-10-CM | POA: Diagnosis not present

## 2021-12-12 DIAGNOSIS — M542 Cervicalgia: Secondary | ICD-10-CM | POA: Diagnosis not present

## 2021-12-12 DIAGNOSIS — M5416 Radiculopathy, lumbar region: Secondary | ICD-10-CM | POA: Diagnosis not present

## 2022-02-02 ENCOUNTER — Other Ambulatory Visit: Payer: Self-pay

## 2022-02-02 DIAGNOSIS — Z87891 Personal history of nicotine dependence: Secondary | ICD-10-CM

## 2022-02-02 DIAGNOSIS — Z122 Encounter for screening for malignant neoplasm of respiratory organs: Secondary | ICD-10-CM

## 2022-02-02 NOTE — Progress Notes (Signed)
LDCT order placed per protocol. Scan scheduled for 03/26 at [facility]

## 2022-02-09 ENCOUNTER — Other Ambulatory Visit: Payer: Self-pay | Admitting: Family

## 2022-02-09 DIAGNOSIS — M5430 Sciatica, unspecified side: Secondary | ICD-10-CM

## 2022-02-09 DIAGNOSIS — F419 Anxiety disorder, unspecified: Secondary | ICD-10-CM

## 2022-02-09 MED ORDER — GABAPENTIN 300 MG PO CAPS: ORAL_CAPSULE | ORAL | 2 refills | Status: DC

## 2022-02-09 NOTE — Telephone Encounter (Signed)
  Prescription Request  02/09/2022  Is this a "Controlled Substance" medicine? NO  Have you seen your PCP in the last 2 weeks? No next appt on 03/06/22  If YES, route message to pool  -  If NO, patient needs to be scheduled for appointment.  What is the name of the medication or equipment? gabapentin (NEURONTIN) 300 MG capsule  megestrol (MEGACE) 40 MG/ML suspension  Retin-A Have you contacted your pharmacy to request a refill? yes   Which pharmacy would you like this sent to? [facility]    Patient notified that their request is being sent to the clinical staff for review and that they should receive a response within 2 business days.

## 2022-02-22 DIAGNOSIS — M5416 Radiculopathy, lumbar region: Secondary | ICD-10-CM | POA: Diagnosis not present

## 2022-02-22 DIAGNOSIS — M546 Pain in thoracic spine: Secondary | ICD-10-CM | POA: Diagnosis not present

## 2022-02-22 DIAGNOSIS — Z6825 Body mass index (BMI) 25.0-25.9, adult: Secondary | ICD-10-CM | POA: Diagnosis not present

## 2022-02-22 DIAGNOSIS — M542 Cervicalgia: Secondary | ICD-10-CM | POA: Diagnosis not present

## 2022-03-06 ENCOUNTER — Ambulatory Visit: Payer: Medicaid Other | Admitting: Family

## 2022-03-06 ENCOUNTER — Encounter: Payer: Self-pay | Admitting: Family

## 2022-03-06 VITALS — BP 99/63 | HR 69 | Temp 97.6°F | Ht 63.0 in | Wt 144.0 lb

## 2022-03-06 DIAGNOSIS — I700 Atherosclerosis of aorta: Secondary | ICD-10-CM

## 2022-03-06 DIAGNOSIS — Z0000 Encounter for general adult medical examination without abnormal findings: Secondary | ICD-10-CM | POA: Diagnosis not present

## 2022-03-06 DIAGNOSIS — F321 Major depressive disorder, single episode, moderate: Secondary | ICD-10-CM

## 2022-03-06 DIAGNOSIS — M5430 Sciatica, unspecified side: Secondary | ICD-10-CM

## 2022-03-06 DIAGNOSIS — E785 Hyperlipidemia, unspecified: Secondary | ICD-10-CM

## 2022-03-06 DIAGNOSIS — Z0001 Encounter for general adult medical examination with abnormal findings: Secondary | ICD-10-CM

## 2022-03-06 DIAGNOSIS — K5900 Constipation, unspecified: Secondary | ICD-10-CM

## 2022-03-06 DIAGNOSIS — M65331 Trigger finger, right middle finger: Secondary | ICD-10-CM

## 2022-03-06 DIAGNOSIS — G4700 Insomnia, unspecified: Secondary | ICD-10-CM

## 2022-03-06 DIAGNOSIS — M65321 Trigger finger, right index finger: Secondary | ICD-10-CM

## 2022-03-06 DIAGNOSIS — M5441 Lumbago with sciatica, right side: Secondary | ICD-10-CM | POA: Diagnosis not present

## 2022-03-06 DIAGNOSIS — E538 Deficiency of other specified B group vitamins: Secondary | ICD-10-CM

## 2022-03-06 DIAGNOSIS — Z720 Tobacco use: Secondary | ICD-10-CM | POA: Diagnosis not present

## 2022-03-06 DIAGNOSIS — E559 Vitamin D deficiency, unspecified: Secondary | ICD-10-CM | POA: Diagnosis not present

## 2022-03-06 DIAGNOSIS — J410 Simple chronic bronchitis: Secondary | ICD-10-CM | POA: Diagnosis not present

## 2022-03-06 DIAGNOSIS — M5442 Lumbago with sciatica, left side: Secondary | ICD-10-CM | POA: Diagnosis not present

## 2022-03-06 DIAGNOSIS — F419 Anxiety disorder, unspecified: Secondary | ICD-10-CM

## 2022-03-06 DIAGNOSIS — G8929 Other chronic pain: Secondary | ICD-10-CM | POA: Diagnosis not present

## 2022-03-06 MED ORDER — MIRTAZAPINE 15 MG PO TABS: 15.0000 mg | ORAL_TABLET | Freq: Every day | ORAL | 1 refills | Status: DC

## 2022-03-06 MED ORDER — MIRTAZAPINE 7.5 MG PO TABS: ORAL_TABLET | ORAL | 0 refills | Status: DC

## 2022-03-06 MED ORDER — ROSUVASTATIN CALCIUM 5 MG PO TABS: 5.0000 mg | ORAL_TABLET | Freq: Every day | ORAL | 3 refills | Status: DC

## 2022-03-06 MED ORDER — POLYETHYLENE GLYCOL 3350 17 GM/SCOOP PO POWD: 17.0000 g | Freq: Two times a day (BID) | ORAL | 0 refills | Status: DC | PRN

## 2022-03-06 MED ORDER — GABAPENTIN 300 MG PO CAPS: ORAL_CAPSULE | ORAL | 2 refills | Status: DC

## 2022-03-06 MED ORDER — FOLIC ACID 400 MCG PO TABS: 400.0000 ug | ORAL_TABLET | Freq: Every day | ORAL | 3 refills | Status: DC

## 2022-03-06 MED ORDER — TRAZODONE HCL 100 MG PO TABS: 100.0000 mg | ORAL_TABLET | Freq: Every day | ORAL | 1 refills | Status: DC

## 2022-03-06 MED ORDER — HYDROXYZINE HCL 25 MG PO TABS
25.0000 mg | ORAL_TABLET | Freq: Four times a day (QID) | ORAL | 1 refills | Status: DC | PRN
Start: 2022-03-06 — End: 2022-07-16

## 2022-03-06 NOTE — Progress Notes (Signed)
Subjective:    Patient ID: [name], female    DOB: 05/03/1960, 62 y.o.   MRN: [id number]  Chief Complaint  Patient presents with   Medical Management of Chronic Issues    Wants megace and something for nausea,   Pt presents to the office today CPE and  chronic follow up.    She is followed by Pain Clinic 2 months for chronic back pain.     She is followed by Hematologists every 6 months for Leukocytosis.    She has COPD and is a  1/2 - 1 pack smoker for the last 30 years. She reports she has intermittent SOB. She using dulera.    She has aortic atherosclerosis and hyperlipidemia. She is prescribed Crestor, but does not take regularly.    She saw [facility], but did not like them and stopped.   Complaining of right index and middle finger getting stuck and she will have to peel her fingers back.  Hyperlipidemia This is a chronic problem. The current episode started more than 1 year ago. The problem is uncontrolled. Current antihyperlipidemic treatment includes statins. The current treatment provides moderate improvement of lipids. Risk factors for coronary artery disease include dyslipidemia, hypertension, a sedentary lifestyle and post-menopausal.  Nicotine Dependence Presents for follow-up visit. Symptoms include fatigue and irritability. Her urge triggers include company of smokers. The symptoms have been stable. She smokes < 1/2 a pack of cigarettes per day.  Depression        This is a chronic problem.  The current episode started more than 1 year ago.   Associated symptoms include fatigue, restlessness and sad.  Associated symptoms include no helplessness and no hopelessness.  Past medical history includes anxiety.   Anxiety Presents for follow-up visit. Symptoms include excessive worry, irritability, nervous/anxious behavior and restlessness. Symptoms occur occasionally. The severity of symptoms is moderate.        Review of Systems  Constitutional:   Positive for fatigue and irritability.  Psychiatric/Behavioral:  Positive for depression. The patient is nervous/anxious.   All other systems reviewed and are negative.  Family History  Problem Relation Age of Onset   Brain cancer Mother    Cancer - Other Father        Abdominal    Colon cancer Sister    Colon cancer Paternal Grandmother    Social History   Socioeconomic History   Marital status: Divorced    Spouse name: Not on file   Number of children: 2   Years of education: Not on file   Highest education level: Not on file  Occupational History   Occupation: disabled  Tobacco Use   Smoking status: Every Day    Packs/day: 1.00    Types: Cigarettes    Start date: 01/23/1976   Smokeless tobacco: Never  Vaping Use   Vaping Use: Never used  Substance and Sexual Activity   Alcohol use: No   Drug use: No   Sexual activity: Yes  Other Topics Concern   Not on file  Social History Narrative   She lives with her children, she is a Materials engineer, 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    Social Determinants of Health   Financial Resource Strain: Not on file  Food Insecurity: Not on file  Transportation Needs: Not on file  Physical Activity: Not on file  Stress: Not on file  Social Connections: Not on file        Objective:  Physical Exam Vitals reviewed.  Constitutional:      General: She is not in acute distress.    Appearance: She is well-developed.  HENT:     Head: Normocephalic and atraumatic.     Right Ear: Tympanic membrane normal.     Left Ear: Tympanic membrane normal.  Eyes:     Pupils: Pupils are equal, round, and reactive to light.  Neck:     Thyroid: No thyromegaly.  Cardiovascular:     Rate and Rhythm: Normal rate and regular rhythm.     Heart sounds: Normal heart sounds. No murmur heard. Pulmonary:     Effort: Pulmonary effort is normal. No respiratory distress.     Breath sounds: Normal breath sounds. No wheezing.  Abdominal:     General: Bowel  sounds are normal. There is no distension.     Palpations: Abdomen is soft.     Tenderness: There is no abdominal tenderness.  Musculoskeletal:        General: No tenderness. Normal range of motion.     Cervical back: Normal range of motion and neck supple.  Skin:    General: Skin is warm and dry.  Neurological:     Mental Status: She is alert and oriented to person, place, and time.     Cranial Nerves: No cranial nerve deficit.     Deep Tendon Reflexes: Reflexes are normal and symmetric.  Psychiatric:        Behavior: Behavior normal.        Thought Content: Thought content normal.        Judgment: Judgment normal.      BP 99/63   Pulse 69   Temp 97.6 F (36.4 C) (Temporal)   Ht 5' 3"$  (1.6 m)   Wt 144 lb (65.3 kg)   SpO2 96%   BMI 25.51 kg/m      Assessment & Plan:  [name] comes in today with chief complaint of Medical Management of Chronic Issues (Wants megace and something for nausea,)   Diagnosis and orders addressed:  1. Folic acid deficiency - folic acid (FOLVITE) A999333 MCG tablet; Take 1 tablet (400 mcg total) by mouth daily.  Dispense: 90 tablet; Refill: 3 - CMP14+EGFR - CBC with Differential/Platelet  2. Anxiety - gabapentin (NEURONTIN) 300 MG capsule; TAKE 1 CAPSULE BY MOUTH THREE TIMES A DAY  Dispense: 90 capsule; Refill: 2 - CMP14+EGFR - CBC with Differential/Platelet - mirtazapine (REMERON) 7.5 MG tablet; Take 1 tablet (7.5 mg total) by mouth at bedtime for 7 days, THEN 2 tablets (15 mg total) at bedtime for 7 days.  Dispense: 21 tablet; Refill: 0  3. Sciatica, unspecified laterality - gabapentin (NEURONTIN) 300 MG capsule; TAKE 1 CAPSULE BY MOUTH THREE TIMES A DAY  Dispense: 90 capsule; Refill: 2 - CMP14+EGFR - CBC with Differential/Platelet  4. Constipation, unspecified constipation type  - polyethylene glycol powder (GLYCOLAX/MIRALAX) 17 GM/SCOOP powder; Take 17 g by mouth 2 (two) times daily as needed. (NEEDS TO BE SEEN BEFORE NEXT  REFILL)  Dispense: 119 g; Refill: 0 - CMP14+EGFR - CBC with Differential/Platelet  5. Hyperlipidemia, unspecified hyperlipidemia type - rosuvastatin (CRESTOR) 5 MG tablet; Take 1 tablet (5 mg total) by mouth daily.  Dispense: 90 tablet; Refill: 3 - CMP14+EGFR - CBC with Differential/Platelet  6. Insomnia, unspecified type - CMP14+EGFR - CBC with Differential/Platelet - mirtazapine (REMERON) 7.5 MG tablet; Take 1 tablet (7.5 mg total) by mouth at bedtime for 7 days, THEN 2 tablets (15 mg total) at  bedtime for 7 days.  Dispense: 21 tablet; Refill: 0  7. Aortic atherosclerosis (HCC) - CMP14+EGFR - CBC with Differential/Platelet  8. Chronic bilateral low back pain with bilateral sciatica - CMP14+EGFR - CBC with Differential/Platelet  9. Simple chronic bronchitis (HCC) - CMP14+EGFR - CBC with Differential/Platelet  10. Tobacco user - CMP14+EGFR - CBC with Differential/Platelet  11. Current moderate episode of major depressive disorder, unspecified whether recurrent (HCC) - CMP14+EGFR - CBC with Differential/Platelet  12. Vitamin D deficiency  - CMP14+EGFR - CBC with Differential/Platelet  13. Trigger index finger of right hand - Ambulatory referral to Orthopedic Surgery - CMP14+EGFR - CBC with Differential/Platelet  14. Trigger middle finger of right hand - Ambulatory referral to Orthopedic Surgery - CMP14+EGFR - CBC with Differential/Platelet  15. Annual physical exam - CMP14+EGFR - CBC with Differential/Platelet - Lipid panel - TSH   Labs pending Referral to Ortho for trigger finger Start Remeron 7.5 mg for 2 weeks then increase to 15 mg  Health Maintenance reviewed Diet and exercise encouraged  Follow up plan: 3 months    [name], FNP

## 2022-03-06 NOTE — Patient Instructions (Signed)
Trigger Finger  Trigger finger, also called stenosing tenosynovitis,  is a condition that causes a finger to get stuck in a bent position. Each finger has a tendon, which is a tough, cord-like tissue that connects muscle to bone, and each tendon passes through a tunnel of tissue called a tendon sheath. To move your finger, your tendon needs to glide freely through the sheath. Trigger finger happens when the tendon or the sheath thickens, making it difficult to move your finger. Trigger finger can affect any finger or a thumb. It may affect more than one finger. Mild cases may clear up with rest and medicine. Severe cases require more treatment. What are the causes? Trigger finger is caused by a thickened finger tendon or tendon sheath. The cause of this thickening is not known. What increases the risk? The following factors may make you more likely to develop this condition: Doing activities that require a strong grip. Having rheumatoid arthritis, gout, or diabetes. Being 17-51 years old. Being female. What are the signs or symptoms? Symptoms of this condition include: Pain when bending or straightening your finger. Tenderness or swelling where your finger attaches to the palm of your hand. A lump in the palm of your hand or on the inside of your finger. Hearing a noise like a pop or a snap when you try to straighten your finger. Feeling a catching or locking sensation when you try to straighten your finger. Being unable to straighten your finger. How is this diagnosed? This condition is diagnosed based on your symptoms and a physical exam. How is this treated? This condition may be treated by: Resting your finger and avoiding activities that make symptoms worse. Wearing a finger splint to keep your finger extended. Taking NSAIDs, such as ibuprofen, to relieve pain and swelling. Doing gentle exercises to stretch the finger as told by your health care provider. Having medicine that reduces  swelling and inflammation (steroids) injected into the tendon sheath. Injections may need to be repeated. Having surgery to open the tendon sheath. This may be done if other treatments do not work and you cannot straighten your finger. You may need physical therapy after surgery. Follow these instructions at home: If you have a splint: Wear the splint as told by your health care provider. Remove it only as told by your health care provider. Loosen it if your fingers tingle, become numb, or turn cold and blue. Keep it clean. If the splint is not waterproof: Do not let it get wet. Cover it with a watertight covering when you take a bath or shower. Managing pain, stiffness, and swelling     If directed, apply heat to the affected area as often as told by your health care provider. Use the heat source that your health care provider recommends, such as a moist heat pack or a heating pad. Place a towel between your skin and the heat source. Leave the heat on for 20-30 minutes. Remove the heat if your skin turns bright red. This is especially important if you are unable to feel pain, heat, or cold. You may have a greater risk of getting burned. If directed, put ice on the painful area. To do this: If you have a removable splint, remove it as told by your health care provider. Put ice in a plastic bag. Place a towel between your skin and the bag or between your splint and the bag. Leave the ice on for 20 minutes, 2-3 times a day.  Activity Rest  your finger as told by your health care provider. Avoid activities that make the pain worse. Return to your normal activities as told by your health care provider. Ask your health care provider what activities are safe for you. Do exercises as told by your health care provider. Ask your health care provider when it is safe to drive if you have a splint on your hand. General instructions Take over-the-counter and prescription medicines only as told by  your health care provider. Keep all follow-up visits as told by your health care provider. This is important. Contact a health care provider if: Your symptoms are not improving with home care. Summary Trigger finger, also called stenosing tenosynovitis, causes your finger to get stuck in a bent position. This can make it difficult and painful to straighten your finger. This condition develops when a finger tendon or tendon sheath thickens. Treatment may include resting your finger, wearing a splint, and taking medicines. In severe cases, surgery to open the tendon sheath may be needed. This information is not intended to replace advice given to you by your health care provider. Make sure you discuss any questions you have with your health care provider. Document Revised: 05/26/2018 Document Reviewed: 05/26/2018 Elsevier Patient Education  [place].

## 2022-03-07 LAB — CBC WITH DIFFERENTIAL/PLATELET
Basophils Absolute: 0 10*3/uL (ref 0.0–0.2)
Basos: 0 %
EOS (ABSOLUTE): 0 10*3/uL (ref 0.0–0.4)
Eos: 0 %
Hematocrit: 38.2 % (ref 34.0–46.6)
Hemoglobin: 13.4 g/dL (ref 11.1–15.9)
Immature Grans (Abs): 0 10*3/uL (ref 0.0–0.1)
Immature Granulocytes: 0 %
Lymphocytes Absolute: 3.8 10*3/uL — ABNORMAL HIGH (ref 0.7–3.1)
Lymphs: 36 %
MCH: 32.4 pg (ref 26.6–33.0)
MCHC: 35.1 g/dL (ref 31.5–35.7)
MCV: 92 fL (ref 79–97)
Monocytes Absolute: 0.7 10*3/uL (ref 0.1–0.9)
Monocytes: 6 %
Neutrophils Absolute: 6 10*3/uL (ref 1.4–7.0)
Neutrophils: 58 %
Platelets: 354 10*3/uL (ref 150–450)
RBC: 4.14 x10E6/uL (ref 3.77–5.28)
RDW: 13.2 % (ref 11.7–15.4)
WBC: 10.6 10*3/uL (ref 3.4–10.8)

## 2022-03-07 LAB — LIPID PANEL
Chol/HDL Ratio: 4.9 ratio — ABNORMAL HIGH (ref 0.0–4.4)
Cholesterol, Total: 183 mg/dL (ref 100–199)
HDL: 37 mg/dL — ABNORMAL LOW (ref >39)
LDL Chol Calc (NIH): 128 mg/dL — ABNORMAL HIGH (ref 0–99)
Triglycerides: 96 mg/dL (ref 0–149)
VLDL Cholesterol Cal: 18 mg/dL (ref 5–40)

## 2022-03-07 LAB — CMP14+EGFR
ALT: 11 IU/L (ref 0–32)
AST: 14 IU/L (ref 0–40)
Albumin/Globulin Ratio: 2 (ref 1.2–2.2)
Albumin: 4.2 g/dL (ref 3.8–4.9)
Alkaline Phosphatase: 86 IU/L (ref 44–121)
BUN/Creatinine Ratio: 9 — ABNORMAL LOW (ref 12–28)
BUN: 8 mg/dL (ref 8–27)
Bilirubin Total: 0.3 mg/dL (ref 0.0–1.2)
CO2: 20 mmol/L (ref 20–29)
Calcium: 9.7 mg/dL (ref 8.7–10.3)
Chloride: 106 mmol/L (ref 96–106)
Creatinine, Ser: 0.88 mg/dL (ref 0.57–1.00)
Globulin, Total: 2.1 g/dL (ref 1.5–4.5)
Glucose: 62 mg/dL — ABNORMAL LOW (ref 70–99)
Potassium: 4.1 mmol/L (ref 3.5–5.2)
Sodium: 141 mmol/L (ref 134–144)
Total Protein: 6.3 g/dL (ref 6.0–8.5)
eGFR: 75 mL/min/{1.73_m2} (ref >59)

## 2022-03-07 LAB — TSH: TSH: 1.38 u[IU]/mL (ref 0.450–4.500)

## 2022-03-14 ENCOUNTER — Ambulatory Visit: Payer: Medicaid Other | Admitting: Orthopaedic Surgery

## 2022-03-15 ENCOUNTER — Encounter: Payer: Self-pay | Admitting: Family

## 2022-03-22 ENCOUNTER — Ambulatory Visit: Payer: Medicaid Other | Admitting: Orthopaedic Surgery

## 2022-03-26 ENCOUNTER — Encounter: Payer: Self-pay | Admitting: Family

## 2022-03-26 ENCOUNTER — Ambulatory Visit: Payer: Medicaid Other | Admitting: Family

## 2022-04-10 ENCOUNTER — Encounter: Payer: Self-pay | Admitting: Sports Medicine

## 2022-04-10 ENCOUNTER — Ambulatory Visit (INDEPENDENT_AMBULATORY_CARE_PROVIDER_SITE_OTHER): Payer: Medicaid Other | Admitting: Sports Medicine

## 2022-04-10 DIAGNOSIS — M65331 Trigger finger, right middle finger: Secondary | ICD-10-CM | POA: Diagnosis not present

## 2022-04-10 DIAGNOSIS — M65321 Trigger finger, right index finger: Secondary | ICD-10-CM

## 2022-04-10 MED ORDER — BETAMETHASONE SOD PHOS & ACET 6 (3-3) MG/ML IJ SUSP
3.0000 mg | INTRAMUSCULAR | Status: AC | PRN
  Administered 2022-04-10: 3 mg via INTRA_ARTICULAR

## 2022-04-10 MED ORDER — LIDOCAINE HCL 1 % IJ SOLN
1.0000 mL | INTRAMUSCULAR | Status: AC | PRN
  Administered 2022-04-10: 1 mL

## 2022-04-10 MED ORDER — DICLOFENAC SODIUM 75 MG PO TBEC: 75.0000 mg | DELAYED_RELEASE_TABLET | Freq: Two times a day (BID) | ORAL | 1 refills | Status: DC

## 2022-04-10 NOTE — Progress Notes (Signed)
[name] - 62 y.o. female MRN [id number]  Date of birth: [birth year]  Office Visit Note: Visit Date: 04/10/2022 PCP: [name], FNP Referred by: [name], FNP  Subjective: No chief complaint on file.  HPI: [name] is a pleasant 62 y.o. female who presents today for acute on chronic pain of right index finger and right middle finger.  She has noticed pain on the index and long finger of the right hand for a few months, however over the past 1 month she has had an exacerbation of her pain with active triggering.  She is at the point now where both of the fingers are triggering on a daily basis.  It is worse in the morning.  She will have to pry the fingers open passively which causes her pain.  She has a history of right trigger thumb which was surgically released back in 2022.  He has tried taking Tylenol or over-the-counter ibuprofen but this has not been helpful.  Pertinent ROS were reviewed with the patient and found to be negative unless otherwise specified above in HPI.   Assessment & Plan: Visit Diagnoses:  1. Trigger finger, right middle finger   2. Trigger finger, right index finger    - 2 chronic conditions (w/exacerbation) and Rx management  Plan: Discussed with [name] today that she has trigger finger of both index and long finger.  She has had pain over the A1 nodule for few months, although over the last month her symptoms have been exacerbated and she is now getting daily triggering episodes that she will have to pull open passively.  This causes her great discomfort.  We discussed all treatment options such as oral medication, splinting, injection therapy, surgical release.  Through shared decision-making, elected to proceed with trigger finger injections into both fingers, she tolerated well.  Band-Aid splint applied, she is to keep this on for the next 72 hours.  Avoid repetitive flexion of the hand is much as possible.  She may use ice for any  postinjection pain.  We will attempt this calm down her swelling, prescription for diclofenac 75 mg once to twice daily to be taken with food.  She will follow-up in 1 month for reevaluation.  Follow-up: Return in about 1 month (around 05/11/2022) for for trigger fingers.   Meds & Orders:  Meds ordered this encounter  Medications   diclofenac (VOLTAREN) 75 MG EC tablet    Sig: Take 1 tablet (75 mg total) by mouth 2 (two) times daily.    Dispense:  30 tablet    Refill:  1    Orders Placed This Encounter  Procedures   Hand/UE Inj   Hand/UE Inj     Procedures: Hand/UE Inj: R long A1 for trigger finger on 04/10/2022 10:25 AM Indications: pain and tendon swelling Details: 25 G needle, volar approach Medications: 1 mL lidocaine 1 %; 3 mg betamethasone acetate-betamethasone sodium phosphate 6 (3-3) MG/ML Outcome: tolerated well, no immediate complications Procedure, treatment alternatives, risks and benefits explained, specific risks discussed. Consent was given by the patient. Immediately prior to procedure a time out was called to verify the correct patient, procedure, equipment, support staff and site/side marked as required. Patient was prepped and draped in the usual sterile fashion.    Hand/UE Inj: R index A1 for trigger finger on 04/10/2022 10:25 AM Indications: pain and tendon swelling Details: 25 G needle, volar approach Medications: 1 mL lidocaine 1 %; 3 mg betamethasone acetate-betamethasone sodium  phosphate 6 (3-3) MG/ML Outcome: tolerated well, no immediate complications Procedure, treatment alternatives, risks and benefits explained, specific risks discussed. Consent was given by the patient. Immediately prior to procedure a time out was called to verify the correct patient, procedure, equipment, support staff and site/side marked as required. Patient was prepped and draped in the usual sterile fashion.          Clinical History: No specialty comments available.  She  reports that she has been smoking cigarettes. She started smoking about 46 years ago. She has been smoking an average of 1 pack per day. She has never used smokeless tobacco. No results for input(s): "HGBA1C", "LABURIC" in the last 8760 hours.  Objective:    Physical Exam  Gen: Well-appearing, in no acute distress; non-toxic CV:  Well-perfused. Warm.  Resp: Breathing unlabored on room air; no wheezing. Psych: Fluid speech in conversation; appropriate affect; normal thought process Neuro: Sensation intact throughout. No gross coordination deficits.   Ortho Exam - Right hand: There is a well-healed small incision from prior trigger thumb release surgery.  There is tenderness to palpation over the A1 nodule of the index and middle finger.  There is some relative soft tissue swelling.  Without warmth.  No overlying skin changes.  There is no Dupuytren's contracture.  There is active triggering reproducible on exam.  Imaging: No results found.  Past Medical/Family/Surgical/Social History: Medications & Allergies reviewed per EMR, new medications updated. Patient Active Problem List   Diagnosis Date Noted   Trigger thumb, right thumb 10/11/2020   Vitamin D deficiency 07/20/2019   Aortic atherosclerosis ([place]) 03/28/2017   COPD (chronic obstructive pulmonary disease) ([place]) 12/10/2016   HSV-1 infection    Tobacco user    Anxiety    Depression    Hyperlipidemia    Sciatica    Back pain    Right leg pain    Hip pain    Postmenopausal atrophic vaginitis 04/15/2012   Pelvic pain in female 04/15/2012   HEMORRHAGE OF RECTUM AND ANUS 06/16/2009   Past Medical History:  Diagnosis Date   Anxiety    Arthritis    Back pain    COPD (chronic obstructive pulmonary disease) (HCC)    Depression    Dyspnea    Hip pain    HSV-1 infection    Hyperlipidemia    Right leg pain    Sciatica    Tobacco user    Family History  Problem Relation Age of Onset   Brain cancer Mother    Cancer - Other  Father        Abdominal    Colon cancer Sister    Colon cancer Paternal Grandmother    Past Surgical History:  Procedure Laterality Date   BUNIONECTOMY     CATARACT EXTRACTION     CESAREAN SECTION     COLONOSCOPY W/ POLYPECTOMY     ELBOW SURGERY     [place] / CHEEK / CHIN     TONSILLECTOMY     TRIGGER FINGER RELEASE Right 10/24/2020   Procedure: RELEASE TRIGGER RIGHT THUMB;  Surgeon: Sherilyn Cooter, MD;  Location: [facility];  Service: Orthopedics;  Laterality: Right;   TUBAL LIGATION     Social History   Occupational History   Occupation: disabled  Tobacco Use   Smoking status: Every Day    Packs/day: 1    Types: Cigarettes    Start date: 01/23/1976   Smokeless tobacco: Never  Vaping Use   Vaping Use: Never used  Substance and Sexual Activity   Alcohol use: No   Drug use: No   Sexual activity: Yes

## 2022-04-13 ENCOUNTER — Other Ambulatory Visit: Payer: Self-pay | Admitting: Family

## 2022-04-13 DIAGNOSIS — K5900 Constipation, unspecified: Secondary | ICD-10-CM

## 2022-04-17 ENCOUNTER — Ambulatory Visit (HOSPITAL_COMMUNITY)
Admission: RE | Admit: 2022-04-17 | Discharge: 2022-04-17 | Disposition: A | Discharge status: Home / Self Care | Payer: Medicaid Other | Source: Ambulatory Visit | Attending: Physician Assistant | Admitting: Physician Assistant

## 2022-04-17 DIAGNOSIS — Z122 Encounter for screening for malignant neoplasm of respiratory organs: Secondary | ICD-10-CM | POA: Insufficient documentation

## 2022-04-17 DIAGNOSIS — Z87891 Personal history of nicotine dependence: Secondary | ICD-10-CM | POA: Diagnosis not present

## 2022-04-17 DIAGNOSIS — F17210 Nicotine dependence, cigarettes, uncomplicated: Secondary | ICD-10-CM | POA: Diagnosis not present

## 2022-04-27 NOTE — Progress Notes (Signed)
Patient notified of LDCT Lung Cancer Screening Results via mail with the recommendation to follow-up in 12 months. Patient's referring provider has been sent a copy of results. Results are as follows:  IMPRESSION: 1. Lung-RADS 2, benign appearance or behavior. Continue annual screening with low-dose chest CT without contrast in 12 months. 2. Aortic Atherosclerosis (ICD10-I70.0) and Emphysema (ICD10-J43.9). 

## 2022-05-03 DIAGNOSIS — M546 Pain in thoracic spine: Secondary | ICD-10-CM | POA: Diagnosis not present

## 2022-05-03 DIAGNOSIS — M542 Cervicalgia: Secondary | ICD-10-CM | POA: Diagnosis not present

## 2022-05-03 DIAGNOSIS — M5416 Radiculopathy, lumbar region: Secondary | ICD-10-CM | POA: Diagnosis not present

## 2022-05-11 ENCOUNTER — Ambulatory Visit: Payer: Medicaid Other | Admitting: Sports Medicine

## 2022-05-13 ENCOUNTER — Other Ambulatory Visit: Payer: Self-pay | Admitting: Family

## 2022-05-13 ENCOUNTER — Other Ambulatory Visit: Payer: Self-pay | Admitting: Hematology

## 2022-06-27 DIAGNOSIS — M25571 Pain in right ankle and joints of right foot: Secondary | ICD-10-CM | POA: Diagnosis not present

## 2022-06-28 ENCOUNTER — Ambulatory Visit: Payer: Medicaid Other | Admitting: Nurse Practitioner

## 2022-06-28 ENCOUNTER — Ambulatory Visit (INDEPENDENT_AMBULATORY_CARE_PROVIDER_SITE_OTHER): Payer: Medicaid Other

## 2022-06-28 ENCOUNTER — Encounter: Payer: Self-pay | Admitting: Nurse Practitioner

## 2022-06-28 ENCOUNTER — Other Ambulatory Visit: Payer: Self-pay | Admitting: Nurse Practitioner

## 2022-06-28 VITALS — BP 111/75 | HR 67 | Temp 98.5°F | Ht 63.0 in | Wt 140.2 lb

## 2022-06-28 DIAGNOSIS — M25571 Pain in right ankle and joints of right foot: Secondary | ICD-10-CM

## 2022-06-28 DIAGNOSIS — G8929 Other chronic pain: Secondary | ICD-10-CM | POA: Diagnosis not present

## 2022-06-28 MED ORDER — DICLOFENAC SODIUM 75 MG PO TBEC
75.0000 mg | DELAYED_RELEASE_TABLET | Freq: Two times a day (BID) | ORAL | 1 refills | Status: DC
Start: 2022-06-28 — End: 2022-06-29

## 2022-06-28 NOTE — Progress Notes (Addendum)
Acute Office Visit  Subjective:     Patient ID: [name], female    DOB: 07-Feb-1960, 62 y.o.   MRN: [id number]  Chief Complaint  Patient presents with   Ankle Pain    About a year ago pt tripped down stairs yesterday and landed on right ankle and has been having on and off pain since. Yesterday was walking/running in yard and it started hurting.    Hand Pain    Pt says fingers have been locking up for past 3 months. Has had injections in past.    HPI  [name] is a 62 y.o. female who complains of inversion injury to the right ankle 1 year(s) ago. Immediate symptoms: no deformity was noted by the patient. She was seen at an urgent care yesterday, she was sent to [facility] " I didn't go because I wasn't going to drive there". Here today for acute visit for right ankle pain. Symptoms have been chronic and intermittent since that time. Prior history of related problems: no prior problems with this area Describe the pain as dull, aching 3/10. Standing for long period of time makes it worst, and  walking makes it feel better.  Advice her better shoes as she reports only wears crocks. X-ray will be ordered. She is already taking Hydrocodone and Gabapentin. PMDP reviewed Gabapentin 300 mg # 90 filled 06/25/2022 Hydrocodone 10-325 mg #120  filled 06/12/2022  She is also concerns about her depression medications not working, advise her to f/u with PCP to discuss medications adjustment.  Smokes 1/2 pack daily, not ready to quit ROS Negative unless indicated in HPI    Objective:    BP 111/75   Pulse 67   Temp 98.5 F (36.9 C) (Temporal)   Ht 5\' 3"  (1.6 m)   Wt 140 lb 3.2 oz (63.6 kg)   SpO2 96%   BMI 24.84 kg/m  BP Readings from Last 3 Encounters:  06/28/22 111/75  03/06/22 99/63  08/22/21 (!) 131/58   Wt Readings from Last 3 Encounters:  06/28/22 140 lb 3.2 oz (63.6 kg)  03/06/22 144 lb (65.3 kg)  08/22/21 145 lb (65.8 kg)      Physical Exam Vitals and nursing note  reviewed.  Constitutional:      General: She is not in acute distress.    Appearance: Normal appearance.  HENT:     Head: Normocephalic and atraumatic.  Cardiovascular:     Rate and Rhythm: Normal rate.     Heart sounds: Normal heart sounds.  Pulmonary:     Effort: Pulmonary effort is normal.     Breath sounds: Normal breath sounds. No wheezing.  Musculoskeletal:        General: Normal range of motion.     Right ankle: Tenderness present. Normal range of motion.     Right Achilles Tendon: Normal. No tenderness. Thompson's test negative.     Left ankle: Normal.     Left Achilles Tendon: Normal.     Comments: enderness over the lateral malleolus. No tenderness over the medial aspect of the ankle. The fifth metatarsal is not tender. The ankle joint is intact without excessive opening on stressing. X-ray: ordered, but results not yet available, no fracture or dislocation noted, pending review by radiologist The rest of the foot, ankle and leg exam is normal.  Skin:    General: Skin is warm and dry.     Findings: No lesion or rash.  Neurological:     General:  No focal deficit present.     Mental Status: She is alert and oriented to person, place, and time. Mental status is at baseline.  Psychiatric:        Mood and Affect: Mood normal.        Behavior: Behavior normal.        Thought Content: Thought content normal.        Judgment: Judgment normal.     No results found for any visits on 06/28/22.      Assessment & Plan:  Chronic pain of right ankle -     DG Ankle Complete Right -     Diclofenac Sodium; Take 1 tablet (75 mg total) by mouth 2 (two) times daily.  Dispense: 30 tablet; Refill: 1  A right ankle X-Ray was ordered. My reading of this film is preliminary, no fx . (No comparison films available: pending review by Radiologist.)  Right ankle pain Wear better shoes, use Voltaren  Will follow up with PCP in 1-week to discuss mental health meds.  ASSESSMENT: Ankle   sprain  PLAN: rest the injured area as much as practical, elevate the injured limb See orders for this visit as documented in the electronic medical record.  Use Voltaren gel small amount TID PRN Need to get better shoes  Return in about 1 week (around 07/05/2022) for with PCP to discuss mental health medications.  [address] Santa Lighter DNP

## 2022-06-28 NOTE — Telephone Encounter (Signed)
  Name from pharmacy: DICLOFENAC SOD EC 75 MG TAB    Pharmacy comment: Script Clarification:WANTED CREAM FOR FEET AS WELL.

## 2022-06-29 NOTE — Telephone Encounter (Signed)
  Name from pharmacy: DICLOFENAC SOD EC 75 MG TAB        Will file in chart as: diclofenac (VOLTAREN) 75 MG EC tablet    Possible duplicate: Hover to review recent actions on this medication   Sig: TAKE 1 TABLET BY MOUTH TWICE A DAY   Pharmacy comment: Script Clarification: WANTED CREAM FOR FEET AS WELL.

## 2022-07-12 ENCOUNTER — Telehealth: Payer: Self-pay

## 2022-07-12 NOTE — Telephone Encounter (Signed)
Diclofenac approved through 07/09/23    Approval letter attached to charts

## 2022-07-13 ENCOUNTER — Ambulatory Visit: Payer: Medicaid Other | Admitting: Family

## 2022-07-13 ENCOUNTER — Encounter: Payer: Self-pay | Admitting: Family

## 2022-07-13 VITALS — BP 105/69 | HR 58 | Temp 98.7°F | Ht 63.0 in | Wt 138.0 lb

## 2022-07-13 DIAGNOSIS — J410 Simple chronic bronchitis: Secondary | ICD-10-CM | POA: Diagnosis not present

## 2022-07-13 DIAGNOSIS — M65321 Trigger finger, right index finger: Secondary | ICD-10-CM | POA: Diagnosis not present

## 2022-07-13 DIAGNOSIS — M65331 Trigger finger, right middle finger: Secondary | ICD-10-CM | POA: Diagnosis not present

## 2022-07-13 DIAGNOSIS — F321 Major depressive disorder, single episode, moderate: Secondary | ICD-10-CM | POA: Insufficient documentation

## 2022-07-13 DIAGNOSIS — M5442 Lumbago with sciatica, left side: Secondary | ICD-10-CM

## 2022-07-13 DIAGNOSIS — M25571 Pain in right ankle and joints of right foot: Secondary | ICD-10-CM

## 2022-07-13 DIAGNOSIS — I700 Atherosclerosis of aorta: Secondary | ICD-10-CM

## 2022-07-13 DIAGNOSIS — F419 Anxiety disorder, unspecified: Secondary | ICD-10-CM

## 2022-07-13 DIAGNOSIS — Z720 Tobacco use: Secondary | ICD-10-CM

## 2022-07-13 DIAGNOSIS — M159 Polyosteoarthritis, unspecified: Secondary | ICD-10-CM | POA: Insufficient documentation

## 2022-07-13 DIAGNOSIS — R634 Abnormal weight loss: Secondary | ICD-10-CM | POA: Diagnosis not present

## 2022-07-13 DIAGNOSIS — E785 Hyperlipidemia, unspecified: Secondary | ICD-10-CM

## 2022-07-13 DIAGNOSIS — G8929 Other chronic pain: Secondary | ICD-10-CM

## 2022-07-13 DIAGNOSIS — Z1211 Encounter for screening for malignant neoplasm of colon: Secondary | ICD-10-CM

## 2022-07-13 DIAGNOSIS — E559 Vitamin D deficiency, unspecified: Secondary | ICD-10-CM | POA: Diagnosis not present

## 2022-07-13 DIAGNOSIS — M65311 Trigger thumb, right thumb: Secondary | ICD-10-CM | POA: Insufficient documentation

## 2022-07-13 DIAGNOSIS — M5430 Sciatica, unspecified side: Secondary | ICD-10-CM

## 2022-07-13 MED ORDER — DESVENLAFAXINE SUCCINATE ER 50 MG PO TB24
ORAL_TABLET | ORAL | 0 refills | Status: DC
Start: 2022-07-13 — End: 2022-08-07

## 2022-07-13 MED ORDER — ENSURE ENLIVE PO LIQD
237.0000 mL | Freq: Two times a day (BID) | ORAL | 1 refills | Status: DC
Start: 2022-07-13 — End: 2022-07-19

## 2022-07-13 MED ORDER — DESVENLAFAXINE SUCCINATE ER 100 MG PO TB24
100.0000 mg | ORAL_TABLET | Freq: Every day | ORAL | 1 refills | Status: DC
Start: 2022-07-13 — End: 2022-08-27

## 2022-07-13 MED ORDER — CELECOXIB 200 MG PO CAPS
200.0000 mg | ORAL_CAPSULE | Freq: Two times a day (BID) | ORAL | 1 refills | Status: DC
Start: 2022-07-13 — End: 2022-08-27

## 2022-07-13 NOTE — Patient Instructions (Signed)
Trigger Finger  Trigger finger, also called stenosing tenosynovitis, is a condition that causes a finger or thumb to get stuck in a bent position. Each finger has tough, cord-like tissue (tendon) that connects muscle to bone, and each tendon passes through a tunnel of tissue (tendon sheath). The tendon sheaths are held close to the bone by a pulley. There is a pulley called the A1 pulley that is involved in the triggering of a finger or thumb. To move your finger, your tendon needs to glide freely through the sheath. Trigger finger happens when the tendon or the sheath thickens, making it difficult to bend or straighten your finger as the thickened tendon gets stuck in the A1 pulley. Trigger finger can affect any of the fingers or the thumbs. Mild cases may clear up with rest and medicine. Severe cases require more treatment. What are the causes? Trigger finger or thumb is caused by a thickened finger tendon or tendon sheath. The cause of this thickening is not known. What increases the risk? The following factors may make you more likely to develop this condition: Doing the same movements many times (repetitive activity) that require a strong grip. Having certain health conditions. These include rheumatoid arthritis, gout, carpal tunnel syndrome, or diabetes. Being 40-60 years old. Being female. What are the signs or symptoms? Symptoms of this condition include: Pain when bending or straightening your finger. Tenderness, swelling, or a lump in the palm of your hand just below the finger joint. Hearing a noise like a pop or a snap when you try to straighten your finger. Feeling a catch or locked feeling when you try to straighten your finger. Being unable to straighten your finger without help from your other hand. How is this diagnosed? This condition is diagnosed based on your symptoms and a physical exam. How is this treated? This condition may be treated by: Resting your finger and  avoiding activities that make symptoms worse. Wearing a finger splint to keep your finger extended. Taking NSAIDs, such as ibuprofen, to relieve pain and swelling around the tendon. Doing gentle exercises to stretch the finger as told by your health care provider. Having medicine that reduces swelling and inflammation (steroids) injected into the tendon sheath. Injections may need to be repeated. Trigger finger release. This surgery is done to open the pulley. This may be done if other treatments do not work and you cannot straighten or bend your finger. You may need hand therapy after surgery. Follow these instructions at home: If you have a removable splint: Wear the splint as told by your provider. Remove it only as told by your provider. Check the skin around the splint every day. Tell your provider about any concerns. Loosen the splint if your fingers tingle, become numb, or turn cold and blue. Keep the splint clean and dry. If the splint is not waterproof: Do not let it get wet. Cover it with a watertight covering when you take a bath or shower. Managing pain, stiffness, and swelling     If told, put ice on the painful area. If you have a removable splint, remove it as told by your health care provider. Put ice in a plastic bag. Place a towel between your skin and the bag or between your splint and the bag. Leave the ice on for 20 minutes, 2-3 times a day. If told, apply heat to the affected area as often as told by your provider. Use the heat source that your provider recommends, such as   a moist heat pack or a heating pad. Place a towel between your skin and the heat source. Leave the heat on for 20-30 minutes. If your skin turns bright red, remove the ice or heat right away to prevent skin damage. The risk of damage is higher if you cannot feel pain, heat, or cold. Activity Rest your finger as told by your provider. Avoid activities that make the pain worse. Return to your  normal activities as told by your provider. Ask your provider what activities are safe for you. Do exercises as told by your provider. Ask your provider when it is safe to drive if you have a splint on your hand. General instructions Take over-the-counter and prescription medicines only as told by your provider. Keep all follow-up visits. These are needed to see how you are progressing. Contact a health care provider if: Your symptoms are not improving with home care. This information is not intended to replace advice given to you by your health care provider. Make sure you discuss any questions you have with your health care provider. Document Revised: 08/25/2021 Document Reviewed: 08/25/2021 Elsevier Patient Education  2024 Elsevier Inc.  

## 2022-07-13 NOTE — Progress Notes (Addendum)
Subjective:    Patient ID: [name], female    DOB: November 11, 1960, 62 y.o.   MRN: [id number]  Chief Complaint  Patient presents with   fingers locking up    Pt presents to the office today  chronic follow up.    She is followed by Pain Clinic 2 months for chronic back pain.     She is followed by Hematologists annually for Leukocytosis.    She has COPD and is a  1/2 - 1 pack smoker for the last 30 years. She reports she has intermittent SOB. She using dulera.    She has aortic atherosclerosis and hyperlipidemia. She is taking Crestor.    She saw [facility], but did not like them and stopped.    Complaining of all four of her fingers getting stuck and she will have to peel her fingers back. She had injections but did not work.  Back Pain This is a chronic problem. The current episode started more than 1 year ago. The problem occurs intermittently. The problem has been waxing and waning since onset. The pain is present in the lumbar spine. The quality of the pain is described as aching. The pain is at a severity of 7/10. The pain is moderate. The symptoms are aggravated by twisting and bending. She has tried analgesics for the symptoms. The treatment provided moderate relief.  Depression        This is a chronic problem.  The current episode started more than 1 year ago.   The problem occurs intermittently.  Associated symptoms include restlessness and sad.  Associated symptoms include no helplessness and no hopelessness.  Past medical history includes anxiety.   Anxiety Presents for follow-up visit. Symptoms include excessive worry, nervous/anxious behavior, obsessions, palpitations and restlessness. Symptoms occur occasionally. The severity of symptoms is moderate.    Nicotine Dependence Presents for follow-up visit. Her urge triggers include company of smokers. The symptoms have been stable. She smokes 1 pack of cigarettes per day.  Arthritis Presents for follow-up  visit. She complains of pain and stiffness. Affected locations include the right MCP and right hip. Her pain is at a severity of 10/10.      Review of Systems  Cardiovascular:  Positive for palpitations.  Musculoskeletal:  Positive for arthritis, back pain and stiffness.  Psychiatric/Behavioral:  Positive for depression. The patient is nervous/anxious.   All other systems reviewed and are negative.      Objective:   Physical Exam Vitals reviewed.  Constitutional:      General: She is not in acute distress.    Appearance: She is well-developed.  HENT:     Head: Normocephalic and atraumatic.     Right Ear: Tympanic membrane normal.     Left Ear: Tympanic membrane normal.  Eyes:     Pupils: Pupils are equal, round, and reactive to light.  Neck:     Thyroid: No thyromegaly.  Cardiovascular:     Rate and Rhythm: Normal rate and regular rhythm.     Heart sounds: Normal heart sounds. No murmur heard. Pulmonary:     Effort: Pulmonary effort is normal. No respiratory distress.     Breath sounds: Normal breath sounds. No wheezing.  Abdominal:     General: Bowel sounds are normal. There is no distension.     Palpations: Abdomen is soft.     Tenderness: There is no abdominal tenderness.  Musculoskeletal:        General: No tenderness.  Cervical back: Normal range of motion and neck supple.     Comments: Pain in lumbar with flexion, all four fingers getting locked in placed, slight swelling, right ankle pain with flexion and extension   Skin:    General: Skin is warm and dry.  Neurological:     Mental Status: She is alert and oriented to person, place, and time.     Cranial Nerves: No cranial nerve deficit.     Deep Tendon Reflexes: Reflexes are normal and symmetric.  Psychiatric:        Mood and Affect: Mood is anxious. Affect is tearful.        Behavior: Behavior normal.        Thought Content: Thought content normal.        Judgment: Judgment normal.     BP 105/69    Pulse (!) 58   Temp 98.7 F (37.1 C) (Temporal)   Ht 5\' 3"  (1.6 m)   Wt 138 lb (62.6 kg)   SpO2 98%   BMI 24.45 kg/m        Assessment & Plan:  [name] comes in today with chief complaint of fingers locking up    Diagnosis and orders addressed:  1. Anxiety - desvenlafaxine (PRISTIQ) 50 MG 24 hr tablet; Take 1 tablet (50 mg total) by mouth daily for 14 days, THEN 2 tablets (100 mg total) daily for 14 days.  Dispense: 42 tablet; Refill: 0 - desvenlafaxine (PRISTIQ) 100 MG 24 hr tablet; Take 1 tablet (100 mg total) by mouth daily.  Dispense: 90 tablet; Refill: 1 - CMP14+EGFR  2. Aortic atherosclerosis (HCC) - CMP14+EGFR  3. Chronic bilateral low back pain with bilateral sciatica - CMP14+EGFR  4. Chronic pain of right ankle - celecoxib (CELEBREX) 200 MG capsule; Take 1 capsule (200 mg total) by mouth 2 (two) times daily.  Dispense: 60 capsule; Refill: 1 - CMP14+EGFR  5. Simple chronic bronchitis (HCC) - CMP14+EGFR  6. Current moderate episode of major depressive disorder, unspecified whether recurrent (HCC) - CMP14+EGFR  7. Hyperlipidemia, unspecified hyperlipidemia type  - CMP14+EGFR  8. Sciatica, unspecified laterality - celecoxib (CELEBREX) 200 MG capsule; Take 1 capsule (200 mg total) by mouth 2 (two) times daily.  Dispense: 60 capsule; Refill: 1 - CMP14+EGFR  9. Tobacco user - CMP14+EGFR  10. Vitamin D deficiency - CMP14+EGFR  11. Trigger middle finger of right hand - celecoxib (CELEBREX) 200 MG capsule; Take 1 capsule (200 mg total) by mouth 2 (two) times daily.  Dispense: 60 capsule; Refill: 1 - CMP14+EGFR  12. Trigger finger of all digits of both hands - celecoxib (CELEBREX) 200 MG capsule; Take 1 capsule (200 mg total) by mouth 2 (two) times daily.  Dispense: 60 capsule; Refill: 1 - CMP14+EGFR  13. Colon cancer screening - Cologuard - CMP14+EGFR  14. Primary osteoarthritis involving multiple joints - celecoxib (CELEBREX) 200 MG capsule;  Take 1 capsule (200 mg total) by mouth 2 (two) times daily.  Dispense: 60 capsule; Refill: 1 - CMP14+EGFR  15. Depression, major, single episode, moderate (HCC) - desvenlafaxine (PRISTIQ) 50 MG 24 hr tablet; Take 1 tablet (50 mg total) by mouth daily for 14 days, THEN 2 tablets (100 mg total) daily for 14 days.  Dispense: 42 tablet; Refill: 0 - desvenlafaxine (PRISTIQ) 100 MG 24 hr tablet; Take 1 tablet (100 mg total) by mouth daily.  Dispense: 90 tablet; Refill: 1 - CMP14+EGFR  Start Pristiq 50 mg for two weeks then increase to 100 mg  Start  Celebrex 200 mg BID with food No other NSAID's  Pt will call Ortho to follow upon joint pain  Labs pending Health Maintenance reviewed Diet and exercise encouraged  Follow up plan: 2 months    Jannifer Rodney, FNP

## 2022-07-14 ENCOUNTER — Other Ambulatory Visit: Payer: Self-pay | Admitting: Family

## 2022-07-18 ENCOUNTER — Other Ambulatory Visit: Payer: Self-pay | Admitting: Family

## 2022-07-18 DIAGNOSIS — R634 Abnormal weight loss: Secondary | ICD-10-CM

## 2022-07-18 DIAGNOSIS — G8929 Other chronic pain: Secondary | ICD-10-CM

## 2022-07-19 ENCOUNTER — Other Ambulatory Visit: Payer: Self-pay | Admitting: Family Medicine

## 2022-07-19 DIAGNOSIS — G8929 Other chronic pain: Secondary | ICD-10-CM

## 2022-07-19 DIAGNOSIS — R634 Abnormal weight loss: Secondary | ICD-10-CM

## 2022-07-19 MED ORDER — ENSURE ENLIVE PO LIQD
237.0000 mL | Freq: Two times a day (BID) | ORAL | 1 refills | Status: DC
Start: 2022-07-19 — End: 2022-07-19

## 2022-07-19 MED ORDER — ENSURE ENLIVE PO LIQD: 237.0000 mL | Freq: Two times a day (BID) | ORAL | 1 refills | Status: DC

## 2022-07-19 NOTE — Telephone Encounter (Signed)
TC to pt [facility] does not carry the Ensure, pt was it sent to [facility]  Also the Pristiq is making her head hot and ears pop, the Celebrax is causing stomach cramps and she has taken it with food as well. She is going to stop taking it on Thursday and see how things go.

## 2022-07-31 ENCOUNTER — Telehealth: Payer: Self-pay

## 2022-07-31 ENCOUNTER — Telehealth: Payer: Self-pay | Admitting: Sports Medicine

## 2022-07-31 ENCOUNTER — Other Ambulatory Visit: Payer: Self-pay

## 2022-07-31 DIAGNOSIS — Z1231 Encounter for screening mammogram for malignant neoplasm of breast: Secondary | ICD-10-CM

## 2022-07-31 NOTE — Telephone Encounter (Signed)
Chart review completed for patient. Patient is due for screening mammogram. I spoke to patient and we scheduled her with mobile mammogram unit for 08/15/22.  Elijio Miles, [facility]

## 2022-07-31 NOTE — Telephone Encounter (Signed)
Pt called requesting a call from Dr Shon Baton pt states the injection from last visit did not work and pt doesn't want surgery. She is asking for some medical advice or maybe some type of pills that can help with trigger finger. Please call pt about this matter at [phone].

## 2022-08-03 ENCOUNTER — Other Ambulatory Visit: Payer: Self-pay | Admitting: Family

## 2022-08-03 DIAGNOSIS — F321 Major depressive disorder, single episode, moderate: Secondary | ICD-10-CM

## 2022-08-03 DIAGNOSIS — F419 Anxiety disorder, unspecified: Secondary | ICD-10-CM

## 2022-08-03 NOTE — Telephone Encounter (Signed)
Patient is scheduled for 24th

## 2022-08-07 ENCOUNTER — Telehealth: Payer: Self-pay | Admitting: Family

## 2022-08-07 DIAGNOSIS — M5416 Radiculopathy, lumbar region: Secondary | ICD-10-CM | POA: Diagnosis not present

## 2022-08-07 DIAGNOSIS — M545 Low back pain, unspecified: Secondary | ICD-10-CM | POA: Diagnosis not present

## 2022-08-08 ENCOUNTER — Encounter: Payer: Self-pay | Admitting: Sports Medicine

## 2022-08-08 ENCOUNTER — Ambulatory Visit (INDEPENDENT_AMBULATORY_CARE_PROVIDER_SITE_OTHER): Payer: Medicaid Other | Admitting: Sports Medicine

## 2022-08-08 DIAGNOSIS — M65342 Trigger finger, left ring finger: Secondary | ICD-10-CM | POA: Diagnosis not present

## 2022-08-08 DIAGNOSIS — M65332 Trigger finger, left middle finger: Secondary | ICD-10-CM | POA: Diagnosis not present

## 2022-08-08 DIAGNOSIS — M159 Polyosteoarthritis, unspecified: Secondary | ICD-10-CM | POA: Diagnosis not present

## 2022-08-08 DIAGNOSIS — M65331 Trigger finger, right middle finger: Secondary | ICD-10-CM | POA: Diagnosis not present

## 2022-08-08 DIAGNOSIS — M65321 Trigger finger, right index finger: Secondary | ICD-10-CM

## 2022-08-08 MED ORDER — PREDNISONE 10 MG PO TABS: 10.0000 mg | ORAL_TABLET | Freq: Two times a day (BID) | ORAL | 0 refills | Status: AC

## 2022-08-08 NOTE — Progress Notes (Signed)
[name] - 62 y.o. female MRN [id number]  Date of birth: 1960-06-02  Office Visit Note: Visit Date: 08/08/2022 PCP: Junie Spencer, FNP Referred by: Junie Spencer, FNP  Subjective: Chief Complaint  Patient presents with   Right Hand - Pain   Left Hand - Pain   HPI: [name] is a pleasant 62 y.o. female who presents today for follow-up of multiple trigger fingers and OA.  Pain in the hands for many months.  Has a history of right trigger thumb which was surgically released back in 2023 which is doing well.  She will have active triggering of almost all her fingers but the worst is the right index, right middle finger, left ring finger. Has daily triggering episodes.  Denies any history of rheumatologic conditions such as RA, lupus, etc. unsure if she has been tested for this in the past.  Past treatments tried:  -Right trigger thumb release in 2022 (successful), over-the-counter trigger finger splints for remainder of digits did not work, has tried anti-inflammatories in the past but had GI upset, warm water baths  Pertinent ROS were reviewed with the patient and found to be negative unless otherwise specified above in HPI.   Assessment & Plan: Visit Diagnoses:  1. Trigger finger, right middle finger   2. Trigger finger, right index finger   3. Trigger finger, left ring finger   4. Trigger finger, left middle finger   5. Primary osteoarthritis involving multiple joints    Plan: Discussed with Correne today the nature of her multiple trigger fingers.  She has no evidence of Dupuytren's contractures that would be contributing to this.  She has failed over-the-counter trigger finger bracing, NSAIDs and did not receive significant relief of her triggering with corticosteroid injections.  She is hoping to avoid surgery but notes that she likely may need to proceed with this.  We will attempt to settle down her inflammation with a 12-day course of prednisone 20 mg.  Also  will send her to occupational therapy to work on custom splinting, stretching and other treatment modalities as able.  I will have her follow-up in 1 month with our new hand surgeon, Dr. Fara Boros, to review the trigger fingers and discuss what surgical fixation may entail.  Does have some hand arthritis from outside x-rays, it may be smart for her to have blood work and rheumatologic workup from her primary physician to evaluate for causes of her multiple trigger fingers. May follow-up with me as needed.  Follow-up: Return for Make appt with Dr. Fara Boros (hand) in one month for multiple trigger fingers.   Meds & Orders:  Meds ordered this encounter  Medications   predniSONE (DELTASONE) 10 MG tablet    Sig: Take 1 tablet (10 mg total) by mouth 2 (two) times daily with a meal for 12 days.    Dispense:  24 tablet    Refill:  0    Orders Placed This Encounter  Procedures   Ambulatory referral to Occupational Therapy     Procedures: No procedures performed      Clinical History: No specialty comments available.  She reports that she has been smoking cigarettes. She started smoking about 46 years ago. She has a 46.5 pack-year smoking history. She has never used smokeless tobacco. No results for input(s): "HGBA1C", "LABURIC" in the last 8760 hours.  Objective:   Vital Signs: There were no vitals taken for this visit.  Physical Exam  Gen: Well-appearing, in no acute distress;  non-toxic CV: Well-perfused. Warm.  Resp: Breathing unlabored on room air; no wheezing. Psych: Fluid speech in conversation; appropriate affect; normal thought process Neuro: Sensation intact throughout. No gross coordination deficits.   Ortho Exam - Bilateral hands: Well-healed small incision from prior trigger thumb release surgery.  There is tenderness over the A1 nodule of the right index and middle finger as well as the left ring finger.  She does have a mild degree of swelling about the digits with mild dactylitis.  No  warmth or redness.  There is no evidence of Dupuytren's contractures.  There is active triggering reproducible on exam with the above 3 fingers.  Imaging: No results found.  Past Medical/Family/Surgical/Social History: Medications & Allergies reviewed per EMR, new medications updated. Patient Active Problem List   Diagnosis Date Noted   Trigger finger of all digits of both hands 07/13/2022   Depression, major, single episode, moderate (HCC) 07/13/2022   Primary osteoarthritis involving multiple joints 07/13/2022   Chronic pain of right ankle 06/28/2022   Vitamin D deficiency 07/20/2019   Aortic atherosclerosis (HCC) 03/28/2017   COPD (chronic obstructive pulmonary disease) (HCC) 12/10/2016   HSV-1 infection    Tobacco user    Anxiety    Depression    Hyperlipidemia    Sciatica    Back pain    Right leg pain    Hip pain    Postmenopausal atrophic vaginitis 04/15/2012   Pelvic pain in female 04/15/2012   HEMORRHAGE OF RECTUM AND ANUS 06/16/2009   Past Medical History:  Diagnosis Date   Anxiety    Arthritis    Back pain    COPD (chronic obstructive pulmonary disease) (HCC)    Depression    Dyspnea    Hip pain    HSV-1 infection    Hyperlipidemia    Right leg pain    Sciatica    Tobacco user    Family History  Problem Relation Age of Onset   Brain cancer Mother    Cancer - Other Father        Abdominal    Colon cancer Sister    Colon cancer Paternal Grandmother    Past Surgical History:  Procedure Laterality Date   BUNIONECTOMY     CATARACT EXTRACTION     CESAREAN SECTION     COLONOSCOPY W/ POLYPECTOMY     ELBOW SURGERY     NOVASURE ABLATION     RHYTIDECTOMY NECK / CHEEK / CHIN     TONSILLECTOMY     TRIGGER FINGER RELEASE Right 10/24/2020   Procedure: RELEASE TRIGGER RIGHT THUMB;  Surgeon: Marlyne Beards, MD;  Location: [facility];  Service: Orthopedics;  Laterality: Right;   TUBAL LIGATION     Social History   Occupational History    Occupation: disabled  Tobacco Use   Smoking status: Every Day    Current packs/day: 1.00    Average packs/day: 1 pack/day for 46.5 years (46.5 ttl pk-yrs)    Types: Cigarettes    Start date: 01/23/1976   Smokeless tobacco: Never  Vaping Use   Vaping status: Never Used  Substance and Sexual Activity   Alcohol use: No   Drug use: No   Sexual activity: Yes

## 2022-08-08 NOTE — Progress Notes (Signed)
Bilateral hand/trigger finger She states all digits except the thumbs trigger and are painful Does not want another injection; states that was more painful

## 2022-08-10 MED ORDER — MIRTAZAPINE 15 MG PO TBDP: 15.0000 mg | ORAL_TABLET | Freq: Every day | ORAL | 1 refills | Status: DC

## 2022-08-10 NOTE — Telephone Encounter (Signed)
Remeron Prescription sent to pharmacy. High protein diet.   Jannifer Rodney, FNP

## 2022-08-10 NOTE — Telephone Encounter (Signed)
Patient aware and verbalized understanding. °

## 2022-08-14 ENCOUNTER — Inpatient Hospital Stay: Admission: RE | Admit: 2022-08-14 | Payer: Medicaid Other | Source: Ambulatory Visit

## 2022-08-14 ENCOUNTER — Telehealth: Payer: Self-pay | Admitting: Family

## 2022-08-14 DIAGNOSIS — M545 Low back pain, unspecified: Secondary | ICD-10-CM | POA: Diagnosis not present

## 2022-08-14 NOTE — Telephone Encounter (Signed)
Pt wanted to leave request for Christy to send her in Rx for Boost Plus instead of Ensure. Wants Rx sent to [facility] [id number] [place] [address] [place] [id number], phone # [phone] and have them mail it to her home.   Says it should be enough for her to have about 4 a day.

## 2022-08-15 ENCOUNTER — Ambulatory Visit
Admission: RE | Admit: 2022-08-15 | Discharge: 2022-08-15 | Disposition: A | Discharge status: Home / Self Care | Payer: Medicaid Other | Source: Ambulatory Visit | Attending: Family | Admitting: Family

## 2022-08-15 ENCOUNTER — Ambulatory Visit: Payer: Medicaid Other | Admitting: Sports Medicine

## 2022-08-15 DIAGNOSIS — Z1231 Encounter for screening mammogram for malignant neoplasm of breast: Secondary | ICD-10-CM | POA: Diagnosis not present

## 2022-08-16 DIAGNOSIS — M545 Low back pain, unspecified: Secondary | ICD-10-CM | POA: Diagnosis not present

## 2022-08-21 DIAGNOSIS — M545 Low back pain, unspecified: Secondary | ICD-10-CM | POA: Diagnosis not present

## 2022-08-21 DIAGNOSIS — M5416 Radiculopathy, lumbar region: Secondary | ICD-10-CM | POA: Diagnosis not present

## 2022-08-21 MED ORDER — BOOST PLUS PO LIQD: 237.0000 mL | Freq: Three times a day (TID) | ORAL | 3 refills | Status: DC

## 2022-08-21 NOTE — Telephone Encounter (Signed)
Prescription sent to pharmacy.

## 2022-08-22 ENCOUNTER — Telehealth: Payer: Self-pay | Admitting: Family

## 2022-08-22 NOTE — Telephone Encounter (Signed)
07/13/22 OV notes were sent for Boost order from 08/21/22

## 2022-08-23 DIAGNOSIS — M5416 Radiculopathy, lumbar region: Secondary | ICD-10-CM | POA: Diagnosis not present

## 2022-08-23 DIAGNOSIS — M545 Low back pain, unspecified: Secondary | ICD-10-CM | POA: Diagnosis not present

## 2022-08-23 NOTE — Therapy (Deleted)
OUTPATIENT OCCUPATIONAL THERAPY ORTHO EVALUATION  Patient Name: [name] MRN: [id number] DOB:Jul 12, 1960, 62 y.o., female Today's Date: 08/23/2022  PCP: Jannifer Rodney, FNP REFERRING PROVIDER: Dr. Madelyn Brunner, Do  END OF SESSION:   Past Medical History:  Diagnosis Date   Anxiety    Arthritis    Back pain    COPD (chronic obstructive pulmonary disease) (HCC)    Depression    Dyspnea    Hip pain    HSV-1 infection    Hyperlipidemia    Right leg pain    Sciatica    Tobacco user    Past Surgical History:  Procedure Laterality Date   BUNIONECTOMY     CATARACT EXTRACTION     CESAREAN SECTION     COLONOSCOPY W/ POLYPECTOMY     ELBOW SURGERY     NOVASURE ABLATION     RHYTIDECTOMY NECK / CHEEK / CHIN     TONSILLECTOMY     TRIGGER FINGER RELEASE Right 10/24/2020   Procedure: RELEASE TRIGGER RIGHT THUMB;  Surgeon: Marlyne Beards, MD;  Location: [facility];  Service: Orthopedics;  Laterality: Right;   TUBAL LIGATION     Patient Active Problem List   Diagnosis Date Noted   Trigger finger of all digits of both hands 07/13/2022   Depression, major, single episode, moderate (HCC) 07/13/2022   Primary osteoarthritis involving multiple joints 07/13/2022   Chronic pain of right ankle 06/28/2022   Vitamin D deficiency 07/20/2019   Aortic atherosclerosis (HCC) 03/28/2017   COPD (chronic obstructive pulmonary disease) (HCC) 12/10/2016   HSV-1 infection    Tobacco user    Anxiety    Depression    Hyperlipidemia    Sciatica    Back pain    Right leg pain    Hip pain    Postmenopausal atrophic vaginitis 04/15/2012   Pelvic pain in female 04/15/2012   HEMORRHAGE OF RECTUM AND ANUS 06/16/2009    ONSET DATE: 08/08/22  REFERRING DIAG: B76.283 (ICD-10-CM) - Trigger finger, right middle finger   THERAPY DIAG:  No diagnosis found.  Rationale for Evaluation and Treatment: Rehabilitation  SUBJECTIVE:   SUBJECTIVE STATEMENT: *** Pt accompanied by:  {accompnied:27141}  PERTINENT HISTORY: [name] is a pleasant 62 y.o. female who presents today for follow-up of multiple trigger fingers and OA. S/p R trigger thumb relase in 2023 PRECAUTIONS: {Therapy precautions:24002}  RED FLAGS: {PT Red Flags:29287}   WEIGHT BEARING RESTRICTIONS: {Yes ***/No:24003}  PAIN:  Are you having pain? {OPRCPAIN:27236}  FALLS: Has patient fallen in last 6 months? {fallsyesno:27318}  LIVING ENVIRONMENT: Lives with: {OPRC lives with:25569::"lives with their family"} Lives in: {Lives in:25570} Stairs: {opstairs:27293} Has following equipment at home: {Assistive devices:23999}  PLOF: {PLOF:24004}  PATIENT GOALS: ***  NEXT MD VISIT: ***  OBJECTIVE:   HAND DOMINANCE: {MISC; OT HAND DOMINANCE:[id number]}  ADLs: Overall ADLs: *** Transfers/ambulation related to ADLs: Eating: *** Grooming: *** UB Dressing: *** LB Dressing: *** Toileting: *** Bathing: *** Tub Shower transfers: *** Equipment: {equipment:25573}  FUNCTIONAL OUTCOME MEASURES: {OTFUNCTIONALMEASURES:27238}  UPPER EXTREMITY ROM:     {AROM/PROM:27142} ROM Right eval Left eval  Shoulder flexion    Shoulder abduction    Shoulder adduction    Shoulder extension    Shoulder internal rotation    Shoulder external rotation    Elbow flexion    Elbow extension    Wrist flexion    Wrist extension    Wrist ulnar deviation    Wrist radial deviation    Wrist pronation  Wrist supination    (Blank rows = not tested)  {AROM/PROM:27142} ROM Right eval Left eval  Thumb MCP (0-60)    Thumb IP (0-80)    Thumb Radial abd/add (0-55)     Thumb Palmar abd/add (0-45)     Thumb Opposition to Small Finger     Index MCP (0-90)     Index PIP (0-100)     Index DIP (0-70)      Long MCP (0-90)      Long PIP (0-100)      Long DIP (0-70)      Ring MCP (0-90)      Ring PIP (0-100)      Ring DIP (0-70)      Little MCP (0-90)      Little PIP (0-100)      Little DIP (0-70)       (Blank rows = not tested)   UPPER EXTREMITY MMT:     MMT Right eval Left eval  Shoulder flexion    Shoulder abduction    Shoulder adduction    Shoulder extension    Shoulder internal rotation    Shoulder external rotation    Middle trapezius    Lower trapezius    Elbow flexion    Elbow extension    Wrist flexion    Wrist extension    Wrist ulnar deviation    Wrist radial deviation    Wrist pronation    Wrist supination    (Blank rows = not tested)  HAND FUNCTION: {handfunction:27230}  COORDINATION: {otcoordination:27237}  SENSATION: {sensation:27233}  EDEMA: ***  COGNITION: Overall cognitive status: {cognition:24006} Areas of impairment: {impairedcognition:27234}  OBSERVATIONS: ***   TODAY'S TREATMENT:                                                                                                                              DATE: ***    PATIENT EDUCATION: Education details: *** Person educated: {Person educated:25204} Education method: {Education Method:25205} Education comprehension: {Education Comprehension:25206}  HOME EXERCISE PROGRAM: ***  GOALS: Goals reviewed with patient? {yes/no:20286}  SHORT TERM GOALS: Target date: ***  *** Baseline: Goal status: INITIAL  2.  *** Baseline:  Goal status: INITIAL  3.  *** Baseline:  Goal status: INITIAL  4.  *** Baseline:  Goal status: INITIAL  5.  *** Baseline:  Goal status: INITIAL  6.  *** Baseline:  Goal status: INITIAL  LONG TERM GOALS: Target date: ***  *** Baseline:  Goal status: INITIAL  2.  *** Baseline:  Goal status: INITIAL  3.  *** Baseline:  Goal status: INITIAL  4.  *** Baseline:  Goal status: INITIAL  5.  *** Baseline:  Goal status: INITIAL  6.  *** Baseline:  Goal status: INITIAL  ASSESSMENT:  CLINICAL IMPRESSION: Patient is a *** y.o. *** who was seen today for occupational therapy evaluation for ***.   PERFORMANCE DEFICITS: in functional  skills including {OT physical skills:25468}, cognitive skills  including {OT cognitive skills:25469}, and psychosocial skills including {OT psychosocial skills:25470}.   IMPAIRMENTS: are limiting patient from {OT performance deficits:25471}.   COMORBIDITIES: {Comorbidities:25485} that affects occupational performance. Patient will benefit from skilled OT to address above impairments and improve overall function.  MODIFICATION OR ASSISTANCE TO COMPLETE EVALUATION: {OT modification:25474}  OT OCCUPATIONAL PROFILE AND HISTORY: {OT PROFILE AND HISTORY:25484}  CLINICAL DECISION MAKING: {OT CDM:25475}  REHAB POTENTIAL: {rehabpotential:25112}  EVALUATION COMPLEXITY: {Evaluation complexity:25115}      PLAN:  OT FREQUENCY: {rehab frequency:25116}  OT DURATION: {rehab duration:25117}  PLANNED INTERVENTIONS: {OT Interventions:25467}  RECOMMENDED OTHER SERVICES: ***  CONSULTED AND AGREED WITH PLAN OF CARE: {CVE:93810}  PLAN FOR NEXT SESSION: ***   Keene Breath, OT 08/23/2022, 1:49 PM

## 2022-08-24 DIAGNOSIS — M5416 Radiculopathy, lumbar region: Secondary | ICD-10-CM | POA: Diagnosis not present

## 2022-08-24 DIAGNOSIS — M542 Cervicalgia: Secondary | ICD-10-CM | POA: Diagnosis not present

## 2022-08-24 DIAGNOSIS — M546 Pain in thoracic spine: Secondary | ICD-10-CM | POA: Diagnosis not present

## 2022-08-27 ENCOUNTER — Ambulatory Visit: Payer: Medicaid Other | Admitting: Family

## 2022-08-27 ENCOUNTER — Encounter: Payer: Self-pay | Admitting: Family

## 2022-08-27 VITALS — BP 107/71 | HR 56 | Temp 97.6°F | Ht 63.0 in | Wt 139.0 lb

## 2022-08-27 DIAGNOSIS — R630 Anorexia: Secondary | ICD-10-CM

## 2022-08-27 DIAGNOSIS — R634 Abnormal weight loss: Secondary | ICD-10-CM | POA: Diagnosis not present

## 2022-08-27 DIAGNOSIS — Z23 Encounter for immunization: Secondary | ICD-10-CM

## 2022-08-27 DIAGNOSIS — R110 Nausea: Secondary | ICD-10-CM

## 2022-08-27 MED ORDER — BOOST PLUS PO LIQD
237.0000 mL | Freq: Three times a day (TID) | ORAL | 3 refills | Status: DC
Start: 2022-08-27 — End: 2022-08-31

## 2022-08-27 MED ORDER — MEGESTROL ACETATE 400 MG/10ML PO SUSP
400.0000 mg | Freq: Every day | ORAL | 2 refills | Status: DC
Start: 2022-08-27 — End: 2022-11-27

## 2022-08-27 NOTE — Progress Notes (Signed)
Subjective:    Patient ID: [name], female    DOB: 02/11/1960, 62 y.o.   MRN: [id number]  Chief Complaint  Patient presents with   Weight Loss    Discuss wt loss for boost   PT presents to the office today to discuss boost. She reports decreased appetite, nausea. She reports over the last year losing 7 lbs.   She was taking Remeron 15 mg, but states this did not work.     08/27/2022   11:40 AM 07/13/2022   11:41 AM 06/28/2022   10:00 AM  Last 3 Weights  Weight (lbs) 139 lb 138 lb 140 lb 3.2 oz  Weight (kg) 63.05 kg 62.596 kg 63.594 kg     Depression        This is a chronic problem.  The current episode started more than 1 year ago.   The problem occurs intermittently.  Associated symptoms include fatigue.     Review of Systems  Constitutional:  Positive for fatigue.  Psychiatric/Behavioral:  Positive for depression.   All other systems reviewed and are negative.      Objective:   Physical Exam Vitals reviewed.  Constitutional:      General: She is not in acute distress.    Appearance: She is well-developed.  HENT:     Head: Normocephalic and atraumatic.     Right Ear: Tympanic membrane normal.     Left Ear: Tympanic membrane normal.  Eyes:     Pupils: Pupils are equal, round, and reactive to light.  Neck:     Thyroid: No thyromegaly.  Cardiovascular:     Rate and Rhythm: Normal rate and regular rhythm.     Heart sounds: Normal heart sounds. No murmur heard. Pulmonary:     Effort: Pulmonary effort is normal. No respiratory distress.     Breath sounds: Normal breath sounds. No wheezing.  Abdominal:     General: Bowel sounds are normal. There is no distension.     Palpations: Abdomen is soft.     Tenderness: There is no abdominal tenderness.  Musculoskeletal:        General: No tenderness. Normal range of motion.     Cervical back: Normal range of motion and neck supple.  Skin:    General: Skin is warm and dry.  Neurological:     Mental Status: She  is alert and oriented to person, place, and time.     Cranial Nerves: No cranial nerve deficit.     Deep Tendon Reflexes: Reflexes are normal and symmetric.  Psychiatric:        Behavior: Behavior normal.        Thought Content: Thought content normal.        Judgment: Judgment normal.          BP 107/71   Pulse (!) 56   Temp 97.6 F (36.4 C) (Temporal)   Ht 5\' 3"  (1.6 m)   Wt 139 lb (63 kg)   BMI 24.62 kg/m   Assessment & Plan:  [name] comes in today with chief complaint of Weight Loss (Discuss wt loss for boost)   Diagnosis and orders addressed:  1. Decreased appetite - megestrol (MEGACE) 400 MG/10ML suspension; Take 10 mLs (400 mg total) by mouth daily.  Dispense: 300 mL; Refill: 2 - lactose free nutrition (BOOST PLUS) LIQD; Take 237 mLs by mouth 3 (three) times daily with meals.  Dispense: 21330 mL; Refill: 3  2. Nausea - megestrol (MEGACE) 400  MG/10ML suspension; Take 10 mLs (400 mg total) by mouth daily.  Dispense: 300 mL; Refill: 2 - lactose free nutrition (BOOST PLUS) LIQD; Take 237 mLs by mouth 3 (three) times daily with meals.  Dispense: 21330 mL; Refill: 3  3. Weight loss - megestrol (MEGACE) 400 MG/10ML suspension; Take 10 mLs (400 mg total) by mouth daily.  Dispense: 300 mL; Refill: 2 - lactose free nutrition (BOOST PLUS) LIQD; Take 237 mLs by mouth 3 (three) times daily with meals.  Dispense: 21330 mL; Refill: 3   Labs pending Start Megace, discussed only short term Boost daily High protein diet  Refuses all SSRIs  Health Maintenance reviewed Diet and exercise encouraged  Follow up plan: 3 months    Jannifer Rodney, FNP

## 2022-08-27 NOTE — Patient Instructions (Signed)
High-Protein and High-Calorie Diet Eating high-protein and high-calorie foods can help you to gain weight, heal after an injury, and recover after an illness or surgery. The specific amount of daily protein and calories you need depends on: Your body weight. The reason this diet is recommended for you. Generally, a high-protein, high-calorie diet involves: Eating 250-500 extra calories each day. Making sure that you get enough of your daily calories from protein. Ask your health care provider how many of your calories should come from protein. Talk with a health care provider or a dietitian about how much protein and how many calories you need each day. Follow the diet as directed by your health care provider. What are tips for following this plan?  Reading food labels Check the nutrition facts label for calories, grams of fat and protein. Items with more than 4 grams of protein are high-protein foods. Preparing meals Add whole milk, half-and-half, or heavy cream to cereal, pudding, soup, or hot cocoa. Add whole milk to instant breakfast drinks. Add peanut butter to oatmeal or smoothies. Add powdered milk to baked goods, smoothies, or milkshakes. Add powdered milk, cream, or butter to mashed potatoes. Add cheese to cooked vegetables. Make whole-milk yogurt parfaits. Top them with granola, fruit, or nuts. Add cottage cheese to fruit. Add avocado, cheese, or both to sandwiches or salads. Add avocado to smoothies. Add meat, poultry, or seafood to rice, pasta, casseroles, salads, and soups. Use mayonnaise when making egg salad, chicken salad, or tuna salad. Use peanut butter as a dip for fruits and vegetables or as a topping for pretzels, celery, or crackers. Add beans to casseroles, dips, and spreads. Add pureed beans to sauces and soups. Replace calorie-free drinks with calorie-containing drinks, such as milk and fruit juice. Replace water with milk or heavy cream when making foods such as  oatmeal, pudding, or cocoa. Add oil or butter to cooked vegetables and grains. Add cream cheese to sandwiches or as a topping on crackers and bread. Make cream-based pastas and soups. General information Ask your health care provider if you should take a nutritional supplement. Try to eat six small meals each day instead of three large meals. A general goal is to eat every 2 to 3 hours. Eat a balanced diet. In each meal, include one food that is high in protein and one food with fat in it. Keep nutritious snacks available, such as nuts, trail mixes, dried fruit, and yogurt. If you have kidney disease or diabetes, talk with your health care provider about how much protein is safe for you. Too much protein may put extra stress on your kidneys. Drink your calories. Choose high-calorie drinks and have them after your meals. Consider setting a timer to remind you to eat. You will want to eat even if you do not feel very hungry. What high-protein foods should I eat?  Vegetables Soybeans. Peas. Grains Quinoa. Bulgur wheat. Buckwheat. Meats and other proteins Beef, pork, and poultry. Fish and seafood. Eggs. Tofu. Textured vegetable protein (TVP). Peanut butter. Nuts and seeds. Dried beans. Protein powders. Hummus. Dairy Whole milk. Whole-milk yogurt. Powdered milk. Cheese. Cottage Cheese. Eggnog. Beverages High-protein supplement drinks. Soy milk. Other foods Protein bars. The items listed above may not be a complete list of foods and beverages you can eat and drink. Contact a dietitian for more information. What high-calorie foods should I eat? Fruits Dried fruit. Fruit leather. Canned fruit in syrup. Fruit juice. Avocado. Vegetables Vegetables cooked in oil or butter. Fried potatoes. Grains   Pasta. Quick breads. Muffins. Pancakes. Ready-to-eat cereal. Meats and other proteins Peanut butter. Nuts and seeds. Dairy Heavy cream. Whipped cream. Cream cheese. Sour cream. Ice cream. Custard.  Pudding. Whole milk dairy products. Beverages Meal-replacement beverages. Nutrition shakes. Fruit juice. Seasonings and condiments Salad dressing. Mayonnaise. Alfredo sauce. Fruit preserves or jelly. Honey. Syrup. Sweets and desserts Cake. Cookies. Pie. Pastries. Candy bars. Chocolate. Fats and oils Butter or margarine. Oil. Gravy. Other foods Meal-replacement bars. The items listed above may not be a complete list of foods and beverages you can eat and drink. Contact a dietitian for more information. Summary A high-protein, high-calorie diet can help you gain weight or heal faster after an injury, illness, or surgery. To increase your protein and calories, add ingredients such as whole milk, peanut butter, cheese, beans, meat, or seafood to meal items. To get enough extra calories each day, include high-calorie foods and beverages at each meal. Adding a high-calorie drink or shake can be an easy way to help you get enough calories each day. Talk with your healthcare provider or dietitian about the best options for you. This information is not intended to replace advice given to you by your health care provider. Make sure you discuss any questions you have with your health care provider. Document Revised: 12/11/2019 Document Reviewed: 12/13/2019 Elsevier Patient Education  2024 [employer]  

## 2022-08-29 ENCOUNTER — Other Ambulatory Visit: Payer: Self-pay | Admitting: Family

## 2022-08-29 DIAGNOSIS — R630 Anorexia: Secondary | ICD-10-CM

## 2022-08-29 DIAGNOSIS — R110 Nausea: Secondary | ICD-10-CM

## 2022-08-29 DIAGNOSIS — R634 Abnormal weight loss: Secondary | ICD-10-CM

## 2022-08-29 NOTE — Telephone Encounter (Signed)
feeding supplement (BOOST HIGH PROTEIN) LIQD        Changed from: lactose free nutrition (BOOST PLUS) LIQD    Pharmacy comment: PRODUCT NOT AVAILABLE.

## 2022-09-03 ENCOUNTER — Ambulatory Visit: Payer: Medicaid Other | Attending: Sports Medicine | Admitting: Occupational Therapy

## 2022-09-03 DIAGNOSIS — R634 Abnormal weight loss: Secondary | ICD-10-CM | POA: Diagnosis not present

## 2022-09-04 DIAGNOSIS — M5416 Radiculopathy, lumbar region: Secondary | ICD-10-CM | POA: Diagnosis not present

## 2022-09-12 DIAGNOSIS — Z1211 Encounter for screening for malignant neoplasm of colon: Secondary | ICD-10-CM | POA: Diagnosis not present

## 2022-09-13 ENCOUNTER — Other Ambulatory Visit: Payer: Self-pay

## 2022-09-13 ENCOUNTER — Ambulatory Visit: Payer: Medicaid Other | Attending: Sports Medicine | Admitting: Occupational Therapy

## 2022-09-13 DIAGNOSIS — M6281 Muscle weakness (generalized): Secondary | ICD-10-CM | POA: Diagnosis not present

## 2022-09-13 DIAGNOSIS — M25541 Pain in joints of right hand: Secondary | ICD-10-CM | POA: Diagnosis not present

## 2022-09-13 DIAGNOSIS — M25542 Pain in joints of left hand: Secondary | ICD-10-CM | POA: Insufficient documentation

## 2022-09-13 DIAGNOSIS — M65331 Trigger finger, right middle finger: Secondary | ICD-10-CM | POA: Insufficient documentation

## 2022-09-13 DIAGNOSIS — R278 Other lack of coordination: Secondary | ICD-10-CM | POA: Insufficient documentation

## 2022-09-13 NOTE — Therapy (Addendum)
OUTPATIENT OCCUPATIONAL THERAPY ORTHO EVALUATION  Patient Name: [name] MRN: [id number] DOB:10/25/1960, 62 y.o., female Today's Date: 09/13/2022  [facility]P: [facility] REFERRING PROVIDER: Madelyn Brunner, DO  END OF SESSION:  OT End of Session - 09/13/22 1514     Visit Number 1    Number of Visits 9    Date for OT Re-Evaluation 11/09/22    Authorization Type [place] [employer] blue    OT Start Time 0804    OT Stop Time 0846    OT Time Calculation (min) 42 min             Past Medical History:  Diagnosis Date   Anxiety    Arthritis    Back pain    COPD (chronic obstructive pulmonary disease) (HCC)    Depression    Dyspnea    Hip pain    HSV-1 infection    Hyperlipidemia    Right leg pain    Sciatica    Tobacco user    Past Surgical History:  Procedure Laterality Date   BUNIONECTOMY     CATARACT EXTRACTION     CESAREAN SECTION     COLONOSCOPY W/ POLYPECTOMY     ELBOW SURGERY     NOVASURE ABLATION     RHYTIDECTOMY NECK / CHEEK / CHIN     TONSILLECTOMY     TRIGGER FINGER RELEASE Right 10/24/2020   Procedure: RELEASE TRIGGER RIGHT THUMB;  Surgeon: Marlyne Beards, MD;  Location: [facility];  Service: Orthopedics;  Laterality: Right;   TUBAL LIGATION     Patient Active Problem List   Diagnosis Date Noted   Trigger finger of all digits of both hands 07/13/2022   Depression, major, single episode, moderate (HCC) 07/13/2022   Primary osteoarthritis involving multiple joints 07/13/2022   Chronic pain of right ankle 06/28/2022   Vitamin D deficiency 07/20/2019   Aortic atherosclerosis (HCC) 03/28/2017   COPD (chronic obstructive pulmonary disease) (HCC) 12/10/2016   HSV-1 infection    Tobacco user    Anxiety    Depression    Hyperlipidemia    Sciatica    Back pain    Right leg pain    Hip pain    Postmenopausal atrophic vaginitis 04/15/2012   Pelvic pain in female 04/15/2012   HEMORRHAGE OF RECTUM AND ANUS  06/16/2009    ONSET DATE: referral date 08/08/22  REFERRING DIAG: M65.331 (ICD-10-CM) - Trigger finger, right middle finger   THERAPY DIAG:  Pain in joint of right hand  Pain in joint of left hand  Muscle weakness (generalized)  Other lack of coordination  Rationale for Evaluation and Treatment: Rehabilitation  SUBJECTIVE:   SUBJECTIVE STATEMENT: Pt reports noticing trigger fingers starting about a year and a half ago.  Has had cortisone shots in index and long finger on R, and trigger finger release on R thumb.  Reports pain and triggering increases as the day goes on and will be tight in the morning when she wakes up.  Pt accompanied by: self  PERTINENT HISTORY: COPD, Leukocytosis, Aortic atherosclerosis, Hyperlipidemia, back pain, depression, anxiety, arthritis  Past treatments tried:  -Right trigger thumb release in 2022 (successful), over-the-counter trigger finger splints for remainder of digits did not work, has tried anti-inflammatories in the past but had GI upset, warm water baths  PRECAUTIONS: None  RED FLAGS: None   WEIGHT BEARING RESTRICTIONS: No  PAIN:  Are you having pain? Yes: NPRS scale: 3/10 Pain location: hands Pain description: aching  Aggravating factors: repetitive movement, attempting to unlock Relieving factors: run hands under hot water  FALLS: Has patient fallen in last 6 months? No  LIVING ENVIRONMENT: Lives with: lives alone Lives in: Mobile home Stairs: Yes: External: 5-6 steps; bilateral but cannot reach both Has following equipment at home: None  PLOF: Independent and Independent with basic ADLs  PATIENT GOALS: to avoid surgery  NEXT MD VISIT: 09/14/22  OBJECTIVE:   HAND DOMINANCE: Right  ADLs: Overall ADLs: Mod I, does not wear items with buttons, wears slip on shoes.  Difficulty with opening containers Transfers/ambulation related to ADLs: Independent Eating: difficulty with opening bottles, difficulty with cutting  vegetables  FUNCTIONAL OUTCOME MEASURES: Quick Dash: 45% (higher number = increased disability)  UPPER EXTREMITY ROM:     Active ROM Right eval Left eval  Shoulder flexion ~150 ~150  Shoulder abduction    Shoulder adduction    Shoulder extension    Shoulder internal rotation [facility] WFL  Shoulder external rotation [facility]  Elbow flexion    Elbow extension    Wrist flexion    Wrist extension    Wrist ulnar deviation    Wrist radial deviation    Wrist pronation    Wrist supination    (Blank rows = not tested)  HAND FUNCTION: Intermittent locking in each finger with onset of pain  COORDINATION: 9 Hole Peg test: Right: 27.7 sec; Left: 30.5 sec Box and Blocks:  Right 46 blocks, Left 40 blocks  SENSATION: WFL  COGNITION: Overall cognitive status: Within functional limits for tasks assessed  OBSERVATIONS: Pt reports onset of trigger finger in all digits, except R thumb due to having successful trigger finger release.  Pt has attempted over the counter splints that did not decrease triggering or pain.    TODAY'S TREATMENT:                                                                                                                              DATE:  NA, eval only    PATIENT EDUCATION: Education details: Educated on role and purpose of OT as well as potential interventions and goals for therapy based on initial evaluation findings. Person educated: Patient Education method: Explanation Education comprehension: verbalized understanding and needs further education  HOME EXERCISE PROGRAM: Access Code: [email or web address] URL: [email or web address] Date: 09/13/2022 Prepared by: [facility] - Outpatient  Rehab - [facility]  Exercises - Seated Wrist Flexor Hook Fist Tendon Gliding  - 2-3 x daily - 15 reps  GOALS: Goals reviewed with patient? No  SHORT TERM GOALS: Target date: 10/12/22  Pt will be independent with gentle ROM and stretching HEP for trigger  finger. Baseline: no exercise plan currently Goal status: INITIAL  2.  Trigger finger splint(s) will be fabricated for daytime and night time wear. Baseline: has attempted over the counter splinting with no results Goal status: INITIAL  3.  Pt will report understanding of joint protection principles to manage trigger finger  symptoms and reduce repetition. Baseline: frequent trigger finger onset with functional tasks Goal status: INITIAL   LONG TERM GOALS: Target date: 11/09/22  Pt will be independent in splint wear and care for trigger finger. Baseline: no current splint Goal status: INITIAL  2.  Pt will demonstrate improved functional grip and use of BUE to open drink bottle/jar. Baseline: requires assistance to open drink bottles Goal status: INITIAL  3.  Pt will report decreased pain to <5/10 after sleep and <3/10 during day. Baseline: currently 3-5 during day and 10/10 after sleep Goal status: INITIAL  4.   Pt will improve functional ability by decreased impairment per Quick DASH assessment from 45% to 30% or better, for better quality of life.  Baseline: 45% Goal status: INITIAL   ASSESSMENT:  CLINICAL IMPRESSION: Patient is a 62 y.o. female who was seen today for occupational therapy evaluation for trigger finger in all digits, most concerning is dominant R hand index and long finger and ring finger on L hand.  Pt with h/o arthritis.  Pt lives alone in a mobile home with 5-6 steps to enter. Pt will benefit from skilled occupational therapy services to address strength and coordination, ROM, pain management, GM/FM control, cognition, introduction of compensatory strategies/AE prn, and implementation of an HEP to improve participation and safety during ADLs and IADLs.   PERFORMANCE DEFICITS: in functional skills including ADLs, IADLs, coordination, ROM, strength, pain, flexibility, Fine motor control, Gross motor control, and UE functional use and psychosocial skills  including environmental adaptation, habits, and routines and behaviors.   IMPAIRMENTS: are limiting patient from ADLs, IADLs, leisure, and social participation.   COMORBIDITIES: may have co-morbidities  that affects occupational performance. Patient will benefit from skilled OT to address above impairments and improve overall function.  MODIFICATION OR ASSISTANCE TO COMPLETE EVALUATION: No modification of tasks or assist necessary to complete an evaluation.  OT OCCUPATIONAL PROFILE AND HISTORY: Problem focused assessment: Including review of records relating to presenting problem.  CLINICAL DECISION MAKING: LOW - limited treatment options, no task modification necessary  REHAB POTENTIAL: Good  EVALUATION COMPLEXITY: Low      PLAN:  OT FREQUENCY: 1x/week  OT DURATION: 8 weeks  PLANNED INTERVENTIONS: self care/ADL training, therapeutic exercise, therapeutic activity, neuromuscular re-education, manual therapy, passive range of motion, splinting, ultrasound, iontophoresis, paraffin, fluidotherapy, compression bandaging, moist heat, cryotherapy, contrast bath, patient/family education, psychosocial skills training, coping strategies training, and DME and/or AE instructions  RECOMMENDED OTHER SERVICES: NA  CONSULTED AND AGREED WITH PLAN OF CARE: Patient  PLAN FOR NEXT SESSION: Complete more functional ROM as able, splint fabrication (day and night splint), add to exercise program as time allows.  Check all possible CPT codes: 78295 - OT Re-evaluation, 97110- Therapeutic Exercise, [phone]- Neuro Re-education, [phone] - Manual Therapy, 97530 - Therapeutic Activities, [phone] - Self Care, [phone] - Ultrasound, [phone] - Orthotic Fit, [id number] -  Paraffin, and [id number] - Subsequent splinting/medication    Check all conditions that are expected to impact treatment: {Conditions expected to impact treatment:Structural or anatomic abnormalities   If treatment provided at initial evaluation, no treatment  charged due to lack of authorization.        Rosalio Loud, OTR/L 09/13/2022, 3:17 PM

## 2022-09-14 ENCOUNTER — Ambulatory Visit: Payer: Medicaid Other | Admitting: Orthopedic Surgery

## 2022-09-21 ENCOUNTER — Ambulatory Visit: Payer: Medicaid Other | Admitting: Occupational Therapy

## 2022-09-26 ENCOUNTER — Ambulatory Visit: Payer: Medicaid Other | Attending: Sports Medicine | Admitting: Occupational Therapy

## 2022-09-26 DIAGNOSIS — M25542 Pain in joints of left hand: Secondary | ICD-10-CM | POA: Insufficient documentation

## 2022-09-26 DIAGNOSIS — R278 Other lack of coordination: Secondary | ICD-10-CM | POA: Insufficient documentation

## 2022-09-26 DIAGNOSIS — M25541 Pain in joints of right hand: Secondary | ICD-10-CM | POA: Insufficient documentation

## 2022-09-26 DIAGNOSIS — M6281 Muscle weakness (generalized): Secondary | ICD-10-CM | POA: Insufficient documentation

## 2022-10-04 ENCOUNTER — Ambulatory Visit: Payer: Medicaid Other | Admitting: Occupational Therapy

## 2022-10-04 DIAGNOSIS — R278 Other lack of coordination: Secondary | ICD-10-CM

## 2022-10-04 DIAGNOSIS — M25541 Pain in joints of right hand: Secondary | ICD-10-CM | POA: Diagnosis not present

## 2022-10-04 DIAGNOSIS — M6281 Muscle weakness (generalized): Secondary | ICD-10-CM | POA: Diagnosis not present

## 2022-10-04 DIAGNOSIS — M25542 Pain in joints of left hand: Secondary | ICD-10-CM | POA: Diagnosis not present

## 2022-10-04 DIAGNOSIS — R634 Abnormal weight loss: Secondary | ICD-10-CM | POA: Diagnosis not present

## 2022-10-04 NOTE — Therapy (Addendum)
 OUTPATIENT OCCUPATIONAL THERAPY ORTHO  Treatment Note  Patient Name: [name] MRN: [id number] DOB:31-Jan-1960, 62 y.o., female Today's Date: 10/04/2022  [facility] REFERRING PROVIDER: Madelyn Brunner, DO  END OF SESSION:  OT End of Session - 10/04/22 1114     Visit Number 2    Number of Visits 9    Date for OT Re-Evaluation 11/09/22    Authorization Type [place] Medicaid Healthy blue    Authorization Time Period approved 7 OT visits from 09/13/2022 - 11/11/2022    Authorization - Visit Number 1    Authorization - Number of Visits 7    OT Start Time 1020    OT Stop Time 1107    OT Time Calculation (min) 47 min              Past Medical History:  Diagnosis Date   Anxiety    Arthritis    Back pain    COPD (chronic obstructive pulmonary disease) (HCC)    Depression    Dyspnea    Hip pain    HSV-1 infection    Hyperlipidemia    Right leg pain    Sciatica    Tobacco user    Past Surgical History:  Procedure Laterality Date   BUNIONECTOMY     CATARACT EXTRACTION     CESAREAN SECTION     COLONOSCOPY W/ POLYPECTOMY     ELBOW SURGERY     NOVASURE ABLATION     RHYTIDECTOMY NECK / CHEEK / CHIN     TONSILLECTOMY     TRIGGER FINGER RELEASE Right 10/24/2020   Procedure: RELEASE TRIGGER RIGHT THUMB;  Surgeon: Marlyne Beards, MD;  Location: [facility];  Service: Orthopedics;  Laterality: Right;   TUBAL LIGATION     Patient Active Problem List   Diagnosis Date Noted   Trigger finger of all digits of both hands 07/13/2022   Depression, major, single episode, moderate (HCC) 07/13/2022   Primary osteoarthritis involving multiple joints 07/13/2022   Chronic pain of right ankle 06/28/2022   Vitamin D deficiency 07/20/2019   Aortic atherosclerosis (HCC) 03/28/2017   COPD (chronic obstructive pulmonary disease) (HCC) 12/10/2016   HSV-1 infection    Tobacco user    Anxiety    Depression    Hyperlipidemia    Sciatica    Back  pain    Right leg pain    Hip pain    Postmenopausal atrophic vaginitis 04/15/2012   Pelvic pain in female 04/15/2012   HEMORRHAGE OF RECTUM AND ANUS 06/16/2009    ONSET DATE: referral date 08/08/22  REFERRING DIAG: M65.331 (ICD-10-CM) - Trigger finger, right middle finger   THERAPY DIAG:  Pain in joint of right hand  Pain in joint of left hand  Other lack of coordination  Muscle weakness (generalized)  Rationale for Evaluation and Treatment: Rehabilitation  SUBJECTIVE:   SUBJECTIVE STATEMENT: Pt reports triggering in each finger. "Don't they make some sort of brass knuckles for this?" Pt accompanied by: self  PERTINENT HISTORY: COPD, Leukocytosis, Aortic atherosclerosis, Hyperlipidemia, back pain, depression, anxiety, arthritis  Past treatments tried:  -Right trigger thumb release in 2022 (successful), over-the-counter trigger finger splints for remainder of digits did not work, has tried anti-inflammatories in the past but had GI upset, warm water baths  PRECAUTIONS: None  RED FLAGS: None   WEIGHT BEARING RESTRICTIONS: No  PAIN:  Are you having pain? Yes: NPRS scale: 3/10 Pain location: hands Pain description: aching Aggravating factors: repetitive movement, attempting  to unlock Relieving factors: run hands under hot water  FALLS: Has patient fallen in last 6 months? No  LIVING ENVIRONMENT: Lives with: lives alone Lives in: Mobile home Stairs: Yes: External: 5-6 steps; bilateral but cannot reach both Has following equipment at home: None  PLOF: Independent and Independent with basic ADLs  PATIENT GOALS: to avoid surgery  NEXT MD VISIT: 09/14/22  OBJECTIVE:   HAND DOMINANCE: Right  ADLs: Overall ADLs: Mod I, does not wear items with buttons, wears slip on shoes.  Difficulty with opening containers Transfers/ambulation related to ADLs: Independent Eating: difficulty with opening bottles, difficulty with cutting vegetables  FUNCTIONAL OUTCOME  MEASURES: Quick Dash: 45% (higher number = increased disability)  UPPER EXTREMITY ROM:     Active ROM Right eval Left eval  Shoulder flexion ~150 ~150  Shoulder abduction    Shoulder adduction    Shoulder extension    Shoulder internal rotation [facility] WFL  Shoulder external rotation [facility]  Elbow flexion    Elbow extension    Wrist flexion    Wrist extension    Wrist ulnar deviation    Wrist radial deviation    Wrist pronation    Wrist supination    (Blank rows = not tested)  HAND FUNCTION: Intermittent locking in each finger with onset of pain  COORDINATION: 9 Hole Peg test: Right: 27.7 sec; Left: 30.5 sec Box and Blocks:  Right 46 blocks, Left 40 blocks  SENSATION: WFL  COGNITION: Overall cognitive status: Within functional limits for tasks assessed  OBSERVATIONS: Pt reports onset of trigger finger in all digits, except R thumb due to having successful trigger finger release.  Pt has attempted over the counter splints that did not decrease triggering or pain.    TODAY'S TREATMENT:                                                      DATE:  10/04/22 Splint fabrication: OT fabricated and fit thermoplastic splint for R long finger and L ring finger to decrease triggering for daytime as well as night tight splints for each digit, except B thumbs.   Self-care: educated on wear and care of splints and encouraging use during daytime functions on most concerning fingers and wearing night time splints at night and intermittently during day as desired.   PATIENT EDUCATION: Education details: splint wear and care Person educated: Patient Education method: Explanation Education comprehension: verbalized understanding and needs further education  HOME EXERCISE PROGRAM: Access Code: [email or web address] URL: [email or web address] Date: 09/13/2022 Prepared by: [facility] - Outpatient  Rehab - [facility]  Exercises - Seated Wrist Flexor Hook Fist Tendon Gliding  -  2-3 x daily - 15 reps  GOALS: Goals reviewed with patient? No  SHORT TERM GOALS: Target date: 10/12/22  Pt will be independent with gentle ROM and stretching HEP for trigger finger. Baseline: no exercise plan currently Goal status: IN PROGRESS  2.  Trigger finger splint(s) will be fabricated for daytime and night time wear. Baseline: has attempted over the counter splinting with no results Goal status:  IN PROGRESS  3.  Pt will report understanding of joint protection principles to manage trigger finger symptoms and reduce repetition. Baseline: frequent trigger finger onset with functional tasks Goal status:  IN PROGRESS   LONG TERM GOALS: Target date: 11/09/22  Pt will  be independent in splint wear and care for trigger finger. Baseline: no current splint Goal status:  IN PROGRESS  2.  Pt will demonstrate improved functional grip and use of BUE to open drink bottle/jar. Baseline: requires assistance to open drink bottles Goal status:  IN PROGRESS  3.  Pt will report decreased pain to <5/10 after sleep and <3/10 during day. Baseline: currently 3-5 during day and 10/10 after sleep Goal status:  IN PROGRESS  4.   Pt will improve functional ability by decreased impairment per Quick DASH assessment from 45% to 30% or better, for better quality of life.  Baseline: 45% Goal status:  IN PROGRESS   ASSESSMENT:  CLINICAL IMPRESSION: Pt to wear splints as directed as provided with daytime and nighttime splints.  Pt also educated on pre-fabricated ring splints that she can order or receive from pharmacy or medical supply store as an alternative to custom fabricated splints.  Pt reports unable to afford to come weekly, therefore planning to wear splints and then return in 2 weeks for modifications to orthoses and initiate ROM exercises.  PERFORMANCE DEFICITS: in functional skills including ADLs, IADLs, coordination, ROM, strength, pain, flexibility, Fine motor control, Gross motor  control, and UE functional use and psychosocial skills including environmental adaptation, habits, and routines and behaviors.     PLAN:  OT FREQUENCY: 1x/week  OT DURATION: 8 weeks  PLANNED INTERVENTIONS: self care/ADL training, therapeutic exercise, therapeutic activity, neuromuscular re-education, manual therapy, passive range of motion, splinting, ultrasound, iontophoresis, paraffin, fluidotherapy, compression bandaging, moist heat, cryotherapy, contrast bath, patient/family education, psychosocial skills training, coping strategies training, and DME and/or AE instructions  RECOMMENDED OTHER SERVICES: NA  CONSULTED AND AGREED WITH PLAN OF CARE: Patient  PLAN FOR NEXT SESSION: Complete more functional ROM as able, modify splints as needed, add to exercise program.        Rosalio Loud, OTR/L 10/04/2022, 11:15 AM   OCCUPATIONAL THERAPY DISCHARGE SUMMARY  Visits from Start of Care: 2  Current functional level related to goals / functional outcomes: Unsure as pt did not return for additional appts.  OT phoned pt on 10/25/22 to inform of missed appt and upcoming appt.  Pt did not show for next appt either, therefore due to cancel/no show policy future appts were canceled and pt encouraged to schedule one at a time and/or discharge from therapy.   Remaining deficits: Hand pain and limited ROM and function.   Education / Equipment: Educated on wear and care of orthoses and tendon gliding.   Patient agrees to discharge. Patient goals were not met. Patient is being discharged due to not returning since the last visit.  Rosalio Loud, OT 04/01/23

## 2022-10-11 ENCOUNTER — Ambulatory Visit: Payer: Medicaid Other | Admitting: Occupational Therapy

## 2022-10-18 ENCOUNTER — Ambulatory Visit: Payer: Medicaid Other | Admitting: Occupational Therapy

## 2022-10-18 NOTE — Therapy (Deleted)
OUTPATIENT OCCUPATIONAL THERAPY ORTHO  Treatment Note  Patient Name: [name] MRN: [id number] DOB:1960-09-05, 62 y.o., female Today's Date: 10/18/2022  [facility]P: [facility] REFERRING PROVIDER: Madelyn Brunner, DO  END OF SESSION:     Past Medical History:  Diagnosis Date   Anxiety    Arthritis    Back pain    COPD (chronic obstructive pulmonary disease) (HCC)    Depression    Dyspnea    Hip pain    HSV-1 infection    Hyperlipidemia    Right leg pain    Sciatica    Tobacco user    Past Surgical History:  Procedure Laterality Date   BUNIONECTOMY     CATARACT EXTRACTION     CESAREAN SECTION     COLONOSCOPY W/ POLYPECTOMY     ELBOW SURGERY     NOVASURE ABLATION     RHYTIDECTOMY NECK / CHEEK / CHIN     TONSILLECTOMY     TRIGGER FINGER RELEASE Right 10/24/2020   Procedure: RELEASE TRIGGER RIGHT THUMB;  Surgeon: Marlyne Beards, MD;  Location: [facility];  Service: Orthopedics;  Laterality: Right;   TUBAL LIGATION     Patient Active Problem List   Diagnosis Date Noted   Trigger finger of all digits of both hands 07/13/2022   Depression, major, single episode, moderate (HCC) 07/13/2022   Primary osteoarthritis involving multiple joints 07/13/2022   Chronic pain of right ankle 06/28/2022   Vitamin D deficiency 07/20/2019   Aortic atherosclerosis (HCC) 03/28/2017   COPD (chronic obstructive pulmonary disease) (HCC) 12/10/2016   HSV-1 infection    Tobacco user    Anxiety    Depression    Hyperlipidemia    Sciatica    Back pain    Right leg pain    Hip pain    Postmenopausal atrophic vaginitis 04/15/2012   Pelvic pain in female 04/15/2012   HEMORRHAGE OF RECTUM AND ANUS 06/16/2009    ONSET DATE: referral date 08/08/22  REFERRING DIAG: M65.331 (ICD-10-CM) - Trigger finger, right middle finger   THERAPY DIAG:  No diagnosis found.  Rationale for Evaluation and Treatment: Rehabilitation  SUBJECTIVE:   SUBJECTIVE  STATEMENT: Pt reports triggering in each finger. "Don't they make some sort of brass knuckles for this?" Pt accompanied by: self  PERTINENT HISTORY: COPD, Leukocytosis, Aortic atherosclerosis, Hyperlipidemia, back pain, depression, anxiety, arthritis  Past treatments tried:  -Right trigger thumb release in 2022 (successful), over-the-counter trigger finger splints for remainder of digits did not work, has tried anti-inflammatories in the past but had GI upset, warm water baths  PRECAUTIONS: None  RED FLAGS: None   WEIGHT BEARING RESTRICTIONS: No  PAIN:  Are you having pain? Yes: NPRS scale: 3/10 Pain location: hands Pain description: aching Aggravating factors: repetitive movement, attempting to unlock Relieving factors: run hands under hot water  FALLS: Has patient fallen in last 6 months? No  LIVING ENVIRONMENT: Lives with: lives alone Lives in: Mobile home Stairs: Yes: External: 5-6 steps; bilateral but cannot reach both Has following equipment at home: None  PLOF: Independent and Independent with basic ADLs  PATIENT GOALS: to avoid surgery  NEXT MD VISIT: 09/14/22  OBJECTIVE:   HAND DOMINANCE: Right  ADLs: Overall ADLs: Mod I, does not wear items with buttons, wears slip on shoes.  Difficulty with opening containers Transfers/ambulation related to ADLs: Independent Eating: difficulty with opening bottles, difficulty with cutting vegetables  FUNCTIONAL OUTCOME MEASURES: Quick Dash: 45% (higher number = increased disability)  UPPER  EXTREMITY ROM:     Active ROM Right eval Left eval  Shoulder flexion ~150 ~150  Shoulder abduction    Shoulder adduction    Shoulder extension    Shoulder internal rotation [facility] WFL  Shoulder external rotation [facility]  Elbow flexion    Elbow extension    Wrist flexion    Wrist extension    Wrist ulnar deviation    Wrist radial deviation    Wrist pronation    Wrist supination    (Blank rows = not tested)  HAND  FUNCTION: Intermittent locking in each finger with onset of pain  COORDINATION: 9 Hole Peg test: Right: 27.7 sec; Left: 30.5 sec Box and Blocks:  Right 46 blocks, Left 40 blocks  SENSATION: WFL  COGNITION: Overall cognitive status: Within functional limits for tasks assessed  OBSERVATIONS: Pt reports onset of trigger finger in all digits, except R thumb due to having successful trigger finger release.  Pt has attempted over the counter splints that did not decrease triggering or pain.    TODAY'S TREATMENT:                                                      DATE:  10/18/22 How are splints going? ROM/stretches    10/04/22 Splint fabrication: OT fabricated and fit thermoplastic splint for R long finger and L ring finger to decrease triggering for daytime as well as night tight splints for each digit, except B thumbs.   Self-care: educated on wear and care of splints and encouraging use during daytime functions on most concerning fingers and wearing night time splints at night and intermittently during day as desired.   PATIENT EDUCATION: Education details: splint wear and care Person educated: Patient Education method: Explanation Education comprehension: verbalized understanding and needs further education  HOME EXERCISE PROGRAM: Access Code: [email or web address] URL: [email or web address] Date: 09/13/2022 Prepared by: [facility] - Outpatient  Rehab - [facility]  Exercises - Seated Wrist Flexor Hook Fist Tendon Gliding  - 2-3 x daily - 15 reps  GOALS: Goals reviewed with patient? No  SHORT TERM GOALS: Target date: 10/12/22  Pt will be independent with gentle ROM and stretching HEP for trigger finger. Baseline: no exercise plan currently Goal status: IN PROGRESS  2.  Trigger finger splint(s) will be fabricated for daytime and night time wear. Baseline: has attempted over the counter splinting with no results Goal status:  IN PROGRESS  3.  Pt will report  understanding of joint protection principles to manage trigger finger symptoms and reduce repetition. Baseline: frequent trigger finger onset with functional tasks Goal status:  IN PROGRESS   LONG TERM GOALS: Target date: 11/09/22  Pt will be independent in splint wear and care for trigger finger. Baseline: no current splint Goal status:  IN PROGRESS  2.  Pt will demonstrate improved functional grip and use of BUE to open drink bottle/jar. Baseline: requires assistance to open drink bottles Goal status:  IN PROGRESS  3.  Pt will report decreased pain to <5/10 after sleep and <3/10 during day. Baseline: currently 3-5 during day and 10/10 after sleep Goal status:  IN PROGRESS  4.   Pt will improve functional ability by decreased impairment per Quick DASH assessment from 45% to 30% or better, for better quality of life.  Baseline: 45% Goal status:  IN  PROGRESS   ASSESSMENT:  CLINICAL IMPRESSION: Pt to wear splints as directed as provided with daytime and nighttime splints.  Pt also educated on pre-fabricated ring splints that she can order or receive from pharmacy or medical supply store as an alternative to custom fabricated splints.  Pt reports unable to afford to come weekly, therefore planning to wear splints and then return in 2 weeks for modifications to orthoses and initiate ROM exercises.  PERFORMANCE DEFICITS: in functional skills including ADLs, IADLs, coordination, ROM, strength, pain, flexibility, Fine motor control, Gross motor control, and UE functional use and psychosocial skills including environmental adaptation, habits, and routines and behaviors.     PLAN:  OT FREQUENCY: 1x/week  OT DURATION: 8 weeks  PLANNED INTERVENTIONS: self care/ADL training, therapeutic exercise, therapeutic activity, neuromuscular re-education, manual therapy, passive range of motion, splinting, ultrasound, iontophoresis, paraffin, fluidotherapy, compression bandaging, moist heat,  cryotherapy, contrast bath, patient/family education, psychosocial skills training, coping strategies training, and DME and/or AE instructions  RECOMMENDED OTHER SERVICES: NA  CONSULTED AND AGREED WITH PLAN OF CARE: Patient  PLAN FOR NEXT SESSION: Complete more functional ROM as able, modify splints as needed, add to exercise program.        Rosalio Loud, OTR/L 10/18/2022, 7:57 AM

## 2022-10-21 ENCOUNTER — Other Ambulatory Visit: Payer: Self-pay | Admitting: Family

## 2022-10-25 ENCOUNTER — Telehealth: Payer: Self-pay | Admitting: Occupational Therapy

## 2022-10-25 ENCOUNTER — Ambulatory Visit: Payer: Medicaid Other | Attending: Sports Medicine | Admitting: Occupational Therapy

## 2022-10-25 NOTE — Telephone Encounter (Signed)
OT phoned pt to inform of No Show for appt today.  Pt reports having family in town and forgot about appt.  Therapist reminded pt of attendance policy and reminded about appointment Oct 10 at 9:30.  Pt reports that she plans to attend upcoming appointment.  Taneal Sonntag, OTR/L

## 2022-11-01 ENCOUNTER — Ambulatory Visit: Payer: Medicaid Other | Admitting: Occupational Therapy

## 2022-11-02 DIAGNOSIS — R634 Abnormal weight loss: Secondary | ICD-10-CM | POA: Diagnosis not present

## 2022-11-06 DIAGNOSIS — R319 Hematuria, unspecified: Secondary | ICD-10-CM | POA: Diagnosis not present

## 2022-11-06 DIAGNOSIS — R350 Frequency of micturition: Secondary | ICD-10-CM | POA: Diagnosis not present

## 2022-11-06 DIAGNOSIS — M549 Dorsalgia, unspecified: Secondary | ICD-10-CM | POA: Diagnosis not present

## 2022-11-06 DIAGNOSIS — M810 Age-related osteoporosis without current pathological fracture: Secondary | ICD-10-CM | POA: Diagnosis not present

## 2022-11-06 DIAGNOSIS — M438X4 Other specified deforming dorsopathies, thoracic region: Secondary | ICD-10-CM | POA: Diagnosis not present

## 2022-11-08 ENCOUNTER — Ambulatory Visit: Payer: Medicaid Other | Admitting: Occupational Therapy

## 2022-11-12 ENCOUNTER — Ambulatory Visit: Payer: Medicaid Other | Admitting: Family

## 2022-11-12 ENCOUNTER — Encounter: Payer: Self-pay | Admitting: Family

## 2022-11-12 VITALS — BP 128/71 | HR 61 | Temp 97.8°F | Resp 20 | Ht 63.0 in | Wt 149.5 lb

## 2022-11-12 DIAGNOSIS — Z09 Encounter for follow-up examination after completed treatment for conditions other than malignant neoplasm: Secondary | ICD-10-CM | POA: Diagnosis not present

## 2022-11-12 DIAGNOSIS — N3001 Acute cystitis with hematuria: Secondary | ICD-10-CM

## 2022-11-12 DIAGNOSIS — K047 Periapical abscess without sinus: Secondary | ICD-10-CM

## 2022-11-12 DIAGNOSIS — M545 Low back pain, unspecified: Secondary | ICD-10-CM

## 2022-11-12 MED ORDER — AMOXICILLIN-POT CLAVULANATE 875-125 MG PO TABS: 1.0000 | ORAL_TABLET | Freq: Two times a day (BID) | ORAL | 0 refills | Status: DC

## 2022-11-12 NOTE — Patient Instructions (Signed)
Acute Back Pain, Adult Acute back pain is sudden and usually short-lived. It is often caused by an injury to the muscles and tissues in the back. The injury may result from: A muscle, tendon, or ligament getting overstretched or torn. Ligaments are tissues that connect bones to each other. Lifting something improperly can cause a back strain. Wear and tear (degeneration) of the spinal disks. Spinal disks are circular tissue that provide cushioning between the bones of the spine (vertebrae). Twisting motions, such as while playing sports or doing yard work. A hit to the back. Arthritis. You may have a physical exam, lab tests, and imaging tests to find the cause of your pain. Acute back pain usually goes away with rest and home care. Follow these instructions at home: Managing pain, stiffness, and swelling Take over-the-counter and prescription medicines only as told by your health care provider. Treatment may include medicines for pain and inflammation that are taken by mouth or applied to the skin, or muscle relaxants. Your health care provider may recommend applying ice during the first 24-48 hours after your pain starts. To do this: Put ice in a plastic bag. Place a towel between your skin and the bag. Leave the ice on for 20 minutes, 2-3 times a day. Remove the ice if your skin turns bright red. This is very important. If you cannot feel pain, heat, or cold, you have a greater risk of damage to the area. If directed, apply heat to the affected area as often as told by your health care provider. Use the heat source that your health care provider recommends, such as a moist heat pack or a heating pad. Place a towel between your skin and the heat source. Leave the heat on for 20-30 minutes. Remove the heat if your skin turns bright red. This is especially important if you are unable to feel pain, heat, or cold. You have a greater risk of getting burned. Activity  Do not stay in bed. Staying in  bed for more than 1-2 days can delay your recovery. Sit up and stand up straight. Avoid leaning forward when you sit or hunching over when you stand. If you work at a desk, sit close to it so you do not need to lean over. Keep your chin tucked in. Keep your neck drawn back, and keep your elbows bent at a 90-degree angle (right angle). Sit high and close to the steering wheel when you drive. Add lower back (lumbar) support to your car seat, if needed. Take short walks on even surfaces as soon as you are able. Try to increase the length of time you walk each day. Do not sit, drive, or stand in one place for more than 30 minutes at a time. Sitting or standing for long periods of time can put stress on your back. Do not drive or use heavy machinery while taking prescription pain medicine. Use proper lifting techniques. When you bend and lift, use positions that put less stress on your back: Bend your knees. Keep the load close to your body. Avoid twisting. Exercise regularly as told by your health care provider. Exercising helps your back heal faster and helps prevent back injuries by keeping muscles strong and flexible. Work with a physical therapist to make a safe exercise program, as recommended by your health care provider. Do any exercises as told by your physical therapist. Lifestyle Maintain a healthy weight. Extra weight puts stress on your back and makes it difficult to have good   posture. Avoid activities or situations that make you feel anxious or stressed. Stress and anxiety increase muscle tension and can make back pain worse. Learn ways to manage anxiety and stress, such as through exercise. General instructions Sleep on a firm mattress in a comfortable position. Try lying on your side with your knees slightly bent. If you lie on your back, put a pillow under your knees. Keep your head and neck in a straight line with your spine (neutral position) when using electronic equipment like  smartphones or pads. To do this: Raise your smartphone or pad to look at it instead of bending your head or neck to look down. Put the smartphone or pad at the level of your face while looking at the screen. Follow your treatment plan as told by your health care provider. This may include: Cognitive or behavioral therapy. Acupuncture or massage therapy. Meditation or yoga. Contact a health care provider if: You have pain that is not relieved with rest or medicine. You have increasing pain going down into your legs or buttocks. Your pain does not improve after 2 weeks. You have pain at night. You lose weight without trying. You have a fever or chills. You develop nausea or vomiting. You develop abdominal pain. Get help right away if: You develop new bowel or bladder control problems. You have unusual weakness or numbness in your arms or legs. You feel faint. These symptoms may represent a serious problem that is an emergency. Do not wait to see if the symptoms will go away. Get medical help right away. Call your local emergency services (911 in the [place] Do not drive yourself to the hospital. Summary Acute back pain is sudden and usually short-lived. Use proper lifting techniques. When you bend and lift, use positions that put less stress on your back. Take over-the-counter and prescription medicines only as told by your health care provider, and apply heat or ice as told. This information is not intended to replace advice given to you by your health care provider. Make sure you discuss any questions you have with your health care provider. Document Revised: 04/01/2020 Document Reviewed: 04/01/2020 Elsevier Patient Education  2024 [employer]vier Inc.  

## 2022-11-12 NOTE — Progress Notes (Signed)
Subjective:    Patient ID: [name], female    DOB: 07-17-1960, 62 y.o.   MRN: [id number]  Chief Complaint  Patient presents with   ER follow up   PT presents to the office today for ED follow up. She went to the ED on 11/06/22 with urinary frequency and back pain. Diagnosed with UTI and back pain. She was started on Macrobid and prednisone. Her symptoms have improved.   She is complaining of right upper tooth pain that started a few months ago. She is followed by dentist and needs to get tooth extracted. Reports mild swelling and pain of 2 out 10, but worse when she is eating.  Back Pain This is a new problem. The current episode started 1 to 4 weeks ago. The problem occurs intermittently. The pain is present in the lumbar spine (right flank). The quality of the pain is described as aching. The pain is at a severity of 8/10. The pain is mild. The symptoms are aggravated by twisting and standing. She has tried bed rest, home exercises and muscle relaxant (prednisone) for the symptoms. The treatment provided mild relief.      Review of Systems  Musculoskeletal:  Positive for back pain.       Objective:   Physical Exam Vitals reviewed.  Constitutional:      General: She is not in acute distress.    Appearance: She is well-developed.  HENT:     Head: Normocephalic and atraumatic.     Comments: Mild swelling in right upper gum Eyes:     Pupils: Pupils are equal, round, and reactive to light.  Neck:     Thyroid: No thyromegaly.  Cardiovascular:     Rate and Rhythm: Normal rate and regular rhythm.     Heart sounds: Normal heart sounds. No murmur heard. Pulmonary:     Effort: Pulmonary effort is normal. No respiratory distress.     Breath sounds: Normal breath sounds. No wheezing.  Abdominal:     General: Bowel sounds are normal. There is no distension.     Palpations: Abdomen is soft.     Tenderness: There is no abdominal tenderness.  Musculoskeletal:        General:  No tenderness. Normal range of motion.     Cervical back: Normal range of motion and neck supple.  Skin:    General: Skin is warm and dry.  Neurological:     Mental Status: She is alert and oriented to person, place, and time.     Cranial Nerves: No cranial nerve deficit.     Deep Tendon Reflexes: Reflexes are normal and symmetric.  Psychiatric:        Behavior: Behavior normal.        Thought Content: Thought content normal.        Judgment: Judgment normal.       BP 128/71   Pulse 61   Temp 97.8 F (36.6 C) (Oral)   Resp 20   Ht 5\' 3"  (1.6 m)   Wt 149 lb 8 oz (67.8 kg)   SpO2 96%   BMI 26.48 kg/m      Assessment & Plan:  [name] comes in today with chief complaint of ER follow up   Diagnosis and orders addressed:  1. Acute right-sided low back pain without sciatica Continue prednisone , pain medication, and muscle relaxer Rest ROM exercises    2. Acute cystitis with hematuria She will completed Macrobid today Force fluids  3. Hospital discharge follow-up Reviewed  4. Dental abscess Will given rx of Augmentin, she will hold off on starting medication today. Will call dentist to make an appointment to have tooth removed, States will not make an appointment without being on antibiotics.   - amoxicillin-clavulanate (AUGMENTIN) 875-125 MG tablet; Take 1 tablet by mouth 2 (two) times daily.  Dispense: 14 tablet; Refill: 0    Jannifer Rodney, FNP

## 2022-11-23 ENCOUNTER — Telehealth: Payer: Self-pay | Admitting: Family

## 2022-11-23 NOTE — Telephone Encounter (Signed)
The Augmentin that I gave her for her tooth should also help with her UTI.

## 2022-11-23 NOTE — Telephone Encounter (Signed)
Patient aware and verbalized understanding. °

## 2022-11-27 ENCOUNTER — Other Ambulatory Visit: Payer: Self-pay | Admitting: Family

## 2022-11-27 DIAGNOSIS — R110 Nausea: Secondary | ICD-10-CM

## 2022-11-27 DIAGNOSIS — R630 Anorexia: Secondary | ICD-10-CM

## 2022-11-27 DIAGNOSIS — R634 Abnormal weight loss: Secondary | ICD-10-CM

## 2022-11-29 DIAGNOSIS — M546 Pain in thoracic spine: Secondary | ICD-10-CM | POA: Diagnosis not present

## 2022-11-29 DIAGNOSIS — M542 Cervicalgia: Secondary | ICD-10-CM | POA: Diagnosis not present

## 2022-11-29 DIAGNOSIS — M5416 Radiculopathy, lumbar region: Secondary | ICD-10-CM | POA: Diagnosis not present

## 2022-12-03 DIAGNOSIS — R634 Abnormal weight loss: Secondary | ICD-10-CM | POA: Diagnosis not present

## 2022-12-07 ENCOUNTER — Other Ambulatory Visit: Payer: Self-pay | Admitting: Family

## 2022-12-25 ENCOUNTER — Other Ambulatory Visit: Payer: Self-pay | Admitting: Family

## 2022-12-28 ENCOUNTER — Telehealth: Payer: Self-pay

## 2022-12-28 MED ORDER — BENZONATATE 100 MG PO CAPS
100.0000 mg | ORAL_CAPSULE | Freq: Two times a day (BID) | ORAL | 0 refills | Status: DC | PRN
Start: 2022-12-28 — End: 2023-04-22

## 2022-12-28 MED ORDER — PROMETHAZINE-DM 6.25-15 MG/5ML PO SYRP
5.0000 mL | ORAL_SOLUTION | Freq: Four times a day (QID) | ORAL | 0 refills | Status: DC | PRN
Start: 2022-12-28 — End: 2023-04-22

## 2022-12-28 NOTE — Telephone Encounter (Signed)
Please advise 

## 2022-12-28 NOTE — Telephone Encounter (Signed)
Copied from CRM [id number]. Topic: Clinical - Medication Question >> Dec 28, 2022  3:52 PM Joanette Gula wrote: Reason for CRM: Pt says that her pharmacy let her know that  promethazine-dextromethorphan (PROMETHAZINE-DM) 6.25-15 MG/5ML syrup isn't covered by her insurance.. Please call [phone] to further advse.

## 2022-12-28 NOTE — Telephone Encounter (Signed)
Tessalon perles was sent to pharmacy. Can not doe codeine cough medicine unless seen face to face

## 2022-12-28 NOTE — Telephone Encounter (Signed)
Copied from CRM [id number]. Topic: Clinical - Medical Advice >> Dec 28, 2022  2:06 PM Lars Mage H wrote: Reason for CRM: Patient has been coughing x 2-3 days - nothing is coming up - patient took musinex and that has not helped - patient would like to know if they could get some cough syrup called in or if they need to come in for an appointment.

## 2022-12-28 NOTE — Addendum Note (Signed)
Addended by: Bennie Pierini on: 12/28/2022 04:34 PM   Modules accepted: Orders

## 2022-12-28 NOTE — Telephone Encounter (Signed)
Cough meds sent to pharmacy

## 2022-12-28 NOTE — Telephone Encounter (Signed)
Patient notified and said she decided to pick up the original rx because it was 13.00.

## 2022-12-31 DIAGNOSIS — R634 Abnormal weight loss: Secondary | ICD-10-CM | POA: Diagnosis not present

## 2023-01-22 ENCOUNTER — Other Ambulatory Visit: Payer: Self-pay | Admitting: Family

## 2023-01-22 DIAGNOSIS — R110 Nausea: Secondary | ICD-10-CM

## 2023-01-22 DIAGNOSIS — R634 Abnormal weight loss: Secondary | ICD-10-CM

## 2023-01-22 DIAGNOSIS — R630 Anorexia: Secondary | ICD-10-CM

## 2023-01-28 DIAGNOSIS — R634 Abnormal weight loss: Secondary | ICD-10-CM | POA: Diagnosis not present

## 2023-01-30 ENCOUNTER — Other Ambulatory Visit: Payer: Self-pay | Admitting: Family

## 2023-02-09 ENCOUNTER — Other Ambulatory Visit: Payer: Self-pay | Admitting: Hematology

## 2023-02-14 ENCOUNTER — Other Ambulatory Visit: Payer: Self-pay

## 2023-02-14 DIAGNOSIS — Z122 Encounter for screening for malignant neoplasm of respiratory organs: Secondary | ICD-10-CM

## 2023-02-14 DIAGNOSIS — Z87891 Personal history of nicotine dependence: Secondary | ICD-10-CM

## 2023-02-27 DIAGNOSIS — R634 Abnormal weight loss: Secondary | ICD-10-CM | POA: Diagnosis not present

## 2023-03-04 ENCOUNTER — Other Ambulatory Visit: Payer: Self-pay | Admitting: Family

## 2023-03-04 DIAGNOSIS — E785 Hyperlipidemia, unspecified: Secondary | ICD-10-CM

## 2023-03-07 ENCOUNTER — Ambulatory Visit: Payer: Self-pay | Admitting: Family

## 2023-03-07 NOTE — Telephone Encounter (Signed)
Chief Complaint: blistering rash Symptoms: swelling redness blistering rashes on face and scalp Frequency: x 1 month Pertinent Negatives: Patient denies itching, hives, fever, vomiting, diarrhea Disposition: [] ED /[x] Urgent Care (no appt availability in office) / [] Appointment(In office/virtual)/ []  [facility]/ [] Home Care/ [] Refused Recommended Disposition /[] [facility]/ []  Follow-up with PCP Additional Notes: Patient called and advised that she had a breakout on her face that she said is not a rash She states that she also has it in her scalp in a couple of places.  She said it looks like blister.  She said she has had something similar before and isn't sure what it is.  She states it is from the corner of her mouth to her chin and under it some.  She said its not a rash and it does not itch. Patient states that one time she came to see [facility] and was given an antibiotic for it. Patient states she has always had skin problems and it isn't like a smooth rash.  Patient states that she is also seeing "floaters" all day every day like little "lightning streaks".  She states she sees what looks like black dots. Patient states that she would see the floaters that had been going on for years.  Now patient states that she is seeing black specks that are present in both eyes. She denies any vision changes. Patient states she has talked to her PCP about these floaters and patient assumed it was anxiety.   Patient also added that she forgot to mention for the past week she has been having episodes every day multiple times a day.  Patient is encouraged to get checked out in the next few hours.  She states that she has not had any loss of consciousness but she just felt light headed.  Patient also added that she a lot of blood in her urine the last time she saw her PCP and she didn't get another urine to check after finishing antibiotics for something else. Patient agreed to go to  an urgent care to get checked out for these multiple symptoms.  Patient is advised that if anything gets worse she can call 911 if needed.  Patient verbalized understanding and states she is going to go to the urgent care.   Reason for Disposition  Localized rash present > 7 days  Many floaters in the eye  (Exception: Floater(s) are a chronic symptom and this is unchanged from patient's baseline pattern.)  [1] MODERATE dizziness (e.g., interferes with normal activities) AND [2] has NOT been evaluated by doctor (or NP/PA) for this  (Exception: Dizziness caused by heat exposure, sudden standing, or poor fluid intake.)  Answer Assessment - Initial Assessment Questions 1. DESCRIPTION: "Describe your dizziness."     Light headed like she might pass out.  She said it happens a couple of times a day every day for the past week 2. LIGHTHEADED: "Do you feel lightheaded?" (e.g., somewhat faint, woozy, weak upon standing)     Not at this time during Triage 3. VERTIGO: "Do you feel like either you or the room is spinning or tilting?" (i.e. vertigo)     No 4. SEVERITY: "How bad is it?"  "Do you feel like you are going to faint?" "Can you stand and walk?"   - MILD: Feels slightly dizzy, but walking normally.   - MODERATE: Feels unsteady when walking, but not falling; interferes with normal activities (e.g., school, work).   - SEVERE: Unable  to walk without falling, or requires assistance to walk without falling; feels like passing out now.      Not at this moment but it is severe for a minute 5. ONSET:  "When did the dizziness begin?"     A week ago 6. AGGRAVATING FACTORS: "Does anything make it worse?" (e.g., standing, change in head position)     unknown 7. HEART RATE: "Can you tell me your heart rate?" "How many beats in 15 seconds?"  (Note: not all patients can do this)       unknown 8. CAUSE: "What do you think is causing the dizziness?"     unknown 9. RECURRENT SYMPTOM: "Have you had dizziness  before?" If Yes, ask: "When was the last time?" "What happened that time?"     Earlier today patient states that  10. OTHER SYMPTOMS: "Do you have any other symptoms?" (e.g., fever, chest pain, vomiting, diarrhea, bleeding)       no  Protocols used: Rash or Redness - Localized-A-AH, Vision Loss or Change-A-AH, Dizziness - Lightheadedness-A-AH

## 2023-03-18 ENCOUNTER — Ambulatory Visit: Payer: Self-pay | Admitting: Family

## 2023-03-18 NOTE — Telephone Encounter (Signed)
 Appt made

## 2023-03-18 NOTE — Telephone Encounter (Signed)
 Copied from CRM [id number]. Topic: Clinical - Red Word Triage >> Mar 18, 2023  3:37 PM Gildardo Pounds wrote: Red Word that prompted transfer to Nurse Triage: Blisters on face, legs and buttocks possible allergic reaction to washing powder. Getting worse. Callback number is [phone] Reason for Disposition  Mild widespread rash  (Exception: Heat rash lasting 3 days or less.)  Answer Assessment - Initial Assessment Questions 1. APPEARANCE of RASH: "Describe the rash." (e.g., spots, blisters, raised areas, skin peeling, scaly)     I have blisters from washing powders.   I thought I could try it one more time.   I bought some Gain with a scent and tried it.   At first I got a blister at my hairline.   I've blisters in my head but they are going away.    They hurt and burned on my scalp.  Then on my face, my eyelid but it's down now.   I took a pin and popped it.   It's lloking muchbetter.     It's on my face.   The scar looks red like a scar after it goes away.   One side of my face is swollen.   I went to urgent care but left because they were so crowded.   I've been putting all kinds of stuff on the rash. It crusted up like poison oak and drained clear fluid.   I wash it with hot water and soap.   It's on my butt cheeks.   6 of them.   Front and back of my legs and inside legs.   2. SIZE: "How big are the spots?" (e.g., tip of pen, eraser, coin; inches, centimeters)     Blisters ike 3. LOCATION: "Where is the rash located?"     See above 4. COLOR: "What color is the rash?" (Note: It is difficult to assess rash color in people with darker-colored skin. When this situation occurs, simply ask the caller to describe what they see.)     Red and scared looking and red. 5. ONSET: "When did the rash begin?"     A month ago 6. FEVER: "Do you have a fever?" If Yes, ask: "What is your temperature, how was it measured, and when did it start?"     No 7. ITCHING: "Does the rash itch?" If Yes, ask: "How bad is the  itch?" (Scale 1-10; or mild, moderate, severe)     Yes   I pop the blisters with a pin.   fThen they stop itching and I look awful. 8. CAUSE: "What do you think is causing the rash?"     Washing powder with scent in it.   I don't usually use scented washing powders because I'm allergic to it.   9. MEDICINE FACTORS: "Have you started any new medicines within the last 2 weeks?" (e.g., antibiotics)      I take hydroxyzine for anxiety.     10. OTHER SYMPTOMS: "Do you have any other symptoms?" (e.g., dizziness, headache, sore throat, joint pain)       See above  11. PREGNANCY: "Is there any chance you are pregnant?" "When was your last menstrual period?"       N/A  Protocols used: Rash or Redness - Widespread-A-AH  Chief Complaint: Rash various area from using scented washing powders.   Symptoms: crusty at some spots, red scared looking in other places, itching Frequency: For a month now Pertinent Negatives: Patient denies using the washing powders now.  Disposition: [] ED /[] Urgent Care (no appt availability in office) / [x] Appointment(In office/virtual)/ []  [facility]/ [] Home Care/ [] Refused Recommended Disposition /[] [facility]/ []  Follow-up with PCP Additional Notes: Agent made her an appt for this coming Friday due to her keeping her grand children.    I instructed her to please come to her appt.   She assured me she would.   Gave ED precaution too.   Wheezing, shortness of breath which she stated she has all the time.

## 2023-03-19 DIAGNOSIS — M5416 Radiculopathy, lumbar region: Secondary | ICD-10-CM | POA: Diagnosis not present

## 2023-03-19 DIAGNOSIS — M546 Pain in thoracic spine: Secondary | ICD-10-CM | POA: Diagnosis not present

## 2023-03-19 DIAGNOSIS — M542 Cervicalgia: Secondary | ICD-10-CM | POA: Diagnosis not present

## 2023-03-22 ENCOUNTER — Ambulatory Visit: Payer: Medicaid Other | Admitting: Family

## 2023-03-22 ENCOUNTER — Encounter: Payer: Self-pay | Admitting: Family

## 2023-03-22 VITALS — BP 136/85 | HR 63 | Temp 97.5°F | Ht 63.0 in | Wt 159.0 lb

## 2023-03-22 DIAGNOSIS — L739 Follicular disorder, unspecified: Secondary | ICD-10-CM | POA: Diagnosis not present

## 2023-03-22 MED ORDER — CEPHALEXIN 500 MG PO CAPS: 500.0000 mg | ORAL_CAPSULE | Freq: Three times a day (TID) | ORAL | 0 refills | Status: DC

## 2023-03-22 MED ORDER — MUPIROCIN 2 % EX OINT
1.0000 | TOPICAL_OINTMENT | Freq: Two times a day (BID) | CUTANEOUS | 0 refills | Status: DC
Start: 2023-03-22 — End: 2023-04-22

## 2023-03-22 MED ORDER — CHLORHEXIDINE GLUCONATE 4 % EX SOLN: Freq: Every day | CUTANEOUS | 1 refills | Status: DC | PRN

## 2023-03-22 NOTE — Progress Notes (Signed)
 Subjective:    Patient ID: [name], female    DOB: 07-03-1960, 63 y.o.   MRN: [id number]  Chief Complaint  Patient presents with   Allergic Reaction   Pt presents to the office today with a rash on face, head, ear, buttocks, and legs. Reports she will noticed a blister that itches then will be filled with 'pus" and then will scab over. Noticed three weeks ago, and  is worsening. Reports she used some new washing detergent.  Allergic Reaction Associated symptoms include a rash.  Rash This is a new problem. The current episode started 1 to 4 weeks ago. The problem has been gradually worsening since onset. The affected locations include the head, face, left buttock, left lower leg, right lower leg and right upper leg. The rash is characterized by itchiness, redness and blistering. She was exposed to a new detergent/soap. Past treatments include anti-itch cream. The treatment provided mild relief.      Review of Systems  Skin:  Positive for rash.  All other systems reviewed and are negative.   Social History   Socioeconomic History   Marital status: Divorced    Spouse name: Not on file   Number of children: 2   Years of education: Not on file   Highest education level: Not on file  Occupational History   Occupation: disabled  Tobacco Use   Smoking status: Every Day    Current packs/day: 1.00    Average packs/day: 1 pack/day for 47.2 years (47.2 ttl pk-yrs)    Types: Cigarettes    Start date: 01/23/1976   Smokeless tobacco: Never  Vaping Use   Vaping status: Never Used  Substance and Sexual Activity   Alcohol use: No   Drug use: No   Sexual activity: Yes  Other Topics Concern   Not on file  Social History Narrative   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    Social Drivers of Corporate investment banker Strain: Not on file  Food Insecurity: Not on file  Transportation Needs: Not on file  Physical Activity: Not on  file  Stress: Not on file  Social Connections: Not on file   Family History  Problem Relation Age of Onset   Brain cancer Mother    Cancer - Other Father        Abdominal    Colon cancer Sister    Colon cancer Paternal Grandmother    Breast cancer Neg Hx         Objective:   Physical Exam Vitals reviewed.  Constitutional:      General: She is not in acute distress.    Appearance: She is well-developed.  HENT:     Head: Normocephalic and atraumatic.  Eyes:     Pupils: Pupils are equal, round, and reactive to light.  Neck:     Thyroid: No thyromegaly.  Cardiovascular:     Rate and Rhythm: Normal rate and regular rhythm.     Heart sounds: Normal heart sounds. No murmur heard. Pulmonary:     Effort: Pulmonary effort is normal. No respiratory distress.     Breath sounds: Normal breath sounds. No wheezing.  Abdominal:     General: Bowel sounds are normal. There is no distension.     Palpations: Abdomen is soft.     Tenderness: There is no abdominal tenderness.  Musculoskeletal:        General: No tenderness.  Normal range of motion.     Cervical back: Normal range of motion and neck supple.  Skin:    General: Skin is warm and dry.     Findings: Rash present.          Comments: Scabbed lesion on face, scalp, pubic, buttocks, and lower legs  Neurological:     Mental Status: She is alert and oriented to person, place, and time.     Cranial Nerves: No cranial nerve deficit.     Deep Tendon Reflexes: Reflexes are normal and symmetric.  Psychiatric:        Behavior: Behavior normal.        Thought Content: Thought content normal.        Judgment: Judgment normal.       BP 136/85   Pulse 63   Temp (!) 97.5 F (36.4 C) (Temporal)   Ht 5\' 3"  (1.6 m)   Wt 159 lb (72.1 kg)   SpO2 96%   BMI 28.17 kg/m      Assessment & Plan:  [name] comes in today with chief complaint of Allergic Reaction   Diagnosis and orders addressed:  1. Folliculitis  (Primary) Keep clean and dry Avoid scratching  Do not shave area  Follow up if symptoms worsen or do not improve  - cephALEXin (KEFLEX) 500 MG capsule; Take 1 capsule (500 mg total) by mouth 3 (three) times daily.  Dispense: 21 capsule; Refill: 0 - mupirocin ointment (BACTROBAN) 2 %; Apply 1 Application topically 2 (two) times daily.  Dispense: 22 g; Refill: 0     Jannifer Rodney, FNP

## 2023-03-22 NOTE — Patient Instructions (Signed)
Folliculitis  Folliculitis occurs when hair follicles become inflamed. A hair follicle is a tiny opening in your skin where your hair grows from. This condition often occurs on the scalp, thighs, legs, back, and buttocks but can happen anywhere on the body. What are the causes? A common cause of this condition is an infection from bacteria. The type of folliculitis caused by bacteria can last a long time or go away and come back. The bacteria can live anywhere on your skin. They are often found in the nostrils. Other causes may include: An infection from a fungus. An infection from a virus. Your skin touching some chemicals, such as oils and tars. Shaving or waxing. Greasy ointments or creams put on the skin. What increases the risk? You are more likely to develop this condition if: Your body has a weak disease-fighting system (immune system). You have diabetes. You are obese. What are the signs or symptoms? Symptoms of this condition include: Redness. Soreness. Swelling. Itching. Small white or yellow, itchy spots filled with pus (pustules) that appear over a red area. If the infection goes deep into the follicle, these may turn into a boil (furuncle). A group of boils (carbuncle). These tend to form in hairy, sweaty areas of the body. How is this diagnosed? This condition is diagnosed with a skin exam. Your health care provider may take a sample of one of the pustules or boils to test in a lab. How is this treated? This condition may be treated by: Putting a warm, wet cloth (warm compress) on the affected areas. Taking antibiotics or applying them to the skin. Applying or bathing with a solution that kills germs (antiseptic). Taking an over-the-counter medicine. This can help with itching. Having a procedure to drain pustules or boils. This may be done if a pustule or boil contains a lot of pus or fluid. Having laser hair removal. This may be done when the condition lasts for a  long time. Follow these instructions at home: Managing pain and swelling  If directed, apply heat to the affected area as often as told by your health care provider. Use the heat source that your health care provider recommends, such as a moist heat pack or a heating pad. Place a towel between your skin and the heat source. Leave the heat on for 20-30 minutes. If your skin turns bright red, remove the heat right away to prevent burns. The risk of burns is higher if you cannot feel pain, heat, or cold. General instructions Take over-the-counter and prescription medicines only as told by your health care provider. If you were prescribed antibiotics, take or apply them as told by your health care provider. Do not stop using the antibiotic even if you start to feel better. Check your irritated area every day for signs of infection. Check for: More redness, swelling, or pain. Fluid or blood. Warmth. Pus or a bad smell. Do not shave irritated skin. Keep all follow-up visits. Your health care provider will check if the treatments are helping. Contact a health care provider if: You have a fever. You have any signs of infection. Red streaks are spreading from the affected area. This information is not intended to replace advice given to you by your health care provider. Make sure you discuss any questions you have with your health care provider. Document Revised: 06/13/2021 Document Reviewed: 06/13/2021 Elsevier Patient Education  2024 [employer].

## 2023-03-30 ENCOUNTER — Other Ambulatory Visit: Payer: Self-pay | Admitting: Family

## 2023-03-30 DIAGNOSIS — E785 Hyperlipidemia, unspecified: Secondary | ICD-10-CM

## 2023-04-01 MED ORDER — ROSUVASTATIN CALCIUM 5 MG PO TABS: 5.0000 mg | ORAL_TABLET | Freq: Every day | ORAL | 0 refills | Status: DC

## 2023-04-01 NOTE — Addendum Note (Signed)
 Addended by: Julious Payer D on: 04/01/2023 09:36 AM   Modules accepted: Orders

## 2023-04-01 NOTE — Telephone Encounter (Signed)
 Christy pt NTBS 30-d given 03/04/23

## 2023-04-01 NOTE — Telephone Encounter (Signed)
 Appt 04-22-2023 with [name]

## 2023-04-02 DIAGNOSIS — R634 Abnormal weight loss: Secondary | ICD-10-CM | POA: Diagnosis not present

## 2023-04-15 ENCOUNTER — Ambulatory Visit (HOSPITAL_COMMUNITY): Payer: Medicaid Other

## 2023-04-19 ENCOUNTER — Other Ambulatory Visit: Payer: Self-pay | Admitting: Family

## 2023-04-19 DIAGNOSIS — R634 Abnormal weight loss: Secondary | ICD-10-CM

## 2023-04-19 DIAGNOSIS — R110 Nausea: Secondary | ICD-10-CM

## 2023-04-19 DIAGNOSIS — R630 Anorexia: Secondary | ICD-10-CM

## 2023-04-22 ENCOUNTER — Other Ambulatory Visit: Payer: Self-pay | Admitting: Family

## 2023-04-22 ENCOUNTER — Encounter: Payer: Self-pay | Admitting: Family

## 2023-04-22 ENCOUNTER — Ambulatory Visit: Admitting: Family

## 2023-04-22 VITALS — BP 108/72 | HR 65 | Temp 98.5°F | Ht 63.0 in | Wt 157.2 lb

## 2023-04-22 DIAGNOSIS — F321 Major depressive disorder, single episode, moderate: Secondary | ICD-10-CM | POA: Diagnosis not present

## 2023-04-22 DIAGNOSIS — J410 Simple chronic bronchitis: Secondary | ICD-10-CM | POA: Diagnosis not present

## 2023-04-22 DIAGNOSIS — E538 Deficiency of other specified B group vitamins: Secondary | ICD-10-CM

## 2023-04-22 DIAGNOSIS — Z720 Tobacco use: Secondary | ICD-10-CM

## 2023-04-22 DIAGNOSIS — I700 Atherosclerosis of aorta: Secondary | ICD-10-CM | POA: Diagnosis not present

## 2023-04-22 DIAGNOSIS — K5900 Constipation, unspecified: Secondary | ICD-10-CM | POA: Insufficient documentation

## 2023-04-22 DIAGNOSIS — F419 Anxiety disorder, unspecified: Secondary | ICD-10-CM

## 2023-04-22 DIAGNOSIS — R630 Anorexia: Secondary | ICD-10-CM

## 2023-04-22 DIAGNOSIS — R634 Abnormal weight loss: Secondary | ICD-10-CM | POA: Diagnosis not present

## 2023-04-22 DIAGNOSIS — M5430 Sciatica, unspecified side: Secondary | ICD-10-CM

## 2023-04-22 DIAGNOSIS — M150 Primary generalized (osteo)arthritis: Secondary | ICD-10-CM | POA: Diagnosis not present

## 2023-04-22 DIAGNOSIS — E785 Hyperlipidemia, unspecified: Secondary | ICD-10-CM | POA: Diagnosis not present

## 2023-04-22 DIAGNOSIS — Z0001 Encounter for general adult medical examination with abnormal findings: Secondary | ICD-10-CM

## 2023-04-22 DIAGNOSIS — Z0000 Encounter for general adult medical examination without abnormal findings: Secondary | ICD-10-CM | POA: Diagnosis not present

## 2023-04-22 DIAGNOSIS — R110 Nausea: Secondary | ICD-10-CM

## 2023-04-22 LAB — LIPID PANEL

## 2023-04-22 MED ORDER — FOLIC ACID 400 MCG PO TABS: 400.0000 ug | ORAL_TABLET | Freq: Every day | ORAL | 3 refills | Status: AC

## 2023-04-22 MED ORDER — HYDROXYZINE HCL 25 MG PO TABS: 25.0000 mg | ORAL_TABLET | Freq: Four times a day (QID) | ORAL | 0 refills | Status: DC | PRN

## 2023-04-22 MED ORDER — GABAPENTIN 300 MG PO CAPS: 300.0000 mg | ORAL_CAPSULE | Freq: Two times a day (BID) | ORAL | 1 refills | Status: DC

## 2023-04-22 MED ORDER — ALBUTEROL SULFATE (2.5 MG/3ML) 0.083% IN NEBU: 2.5000 mg | INHALATION_SOLUTION | Freq: Four times a day (QID) | RESPIRATORY_TRACT | 1 refills | Status: AC | PRN

## 2023-04-22 MED ORDER — DULERA 200-5 MCG/ACT IN AERO: 2.0000 | INHALATION_SPRAY | Freq: Every day | RESPIRATORY_TRACT | 2 refills | Status: DC

## 2023-04-22 MED ORDER — ROSUVASTATIN CALCIUM 5 MG PO TABS: 5.0000 mg | ORAL_TABLET | Freq: Every day | ORAL | 0 refills | Status: DC

## 2023-04-22 MED ORDER — MEGESTROL ACETATE 40 MG/ML PO SUSP
ORAL | 2 refills | Status: DC
Start: 2023-04-22 — End: 2023-07-15

## 2023-04-22 NOTE — Patient Instructions (Signed)

## 2023-04-22 NOTE — Telephone Encounter (Signed)
 Name from pharmacy: GABAPENTIN 300 MG CAPSULE        Will file in chart as: gabapentin (NEURONTIN) 300 MG capsule   Sig: TAKE 1 CAPSULE BY MOUTH 2 (TWO) TIMES DAILY. TAKE 1 CAPSULE BY MOUTH THREE TIMES A DAY   Pharmacy comment: Script Clarification:IS THIS 2 OR 3 TIMES DAILY.

## 2023-04-22 NOTE — Progress Notes (Signed)
 Subjective:    Patient ID: [name], female    DOB: Nov 29, 1960, 63 y.o.   MRN: [id number]  Chief Complaint  Patient presents with   Medical Management of Chronic Issues   Pt presents to the office today  CPE and chronic follow up.    She is followed by Pain Clinic 2 months for chronic back pain.     She has COPD and is a  1/2 - 1 pack smoker for the last 30 years. She reports she has intermittent SOB. She using dulera.    She has aortic atherosclerosis and hyperlipidemia. She is taking Crestor.    She saw [facility], but did not like them and stopped.   PT takes megace for appetite for weight loss.      04/22/2023    8:01 AM 03/22/2023   10:41 AM 11/12/2022    8:44 AM  Last 3 Weights  Weight (lbs) 157 lb 3.2 oz 159 lb 149 lb 8 oz  Weight (kg) 71.305 kg 72.122 kg 67.813 kg     Back Pain This is a chronic problem. The current episode started more than 1 year ago. The problem occurs intermittently. The problem has been waxing and waning since onset. The pain is present in the lumbar spine. The quality of the pain is described as aching. The pain is at a severity of 3/10. The pain is moderate. The symptoms are aggravated by twisting and bending. She has tried analgesics for the symptoms. The treatment provided moderate relief.  Depression        This is a chronic problem.  The current episode started more than 1 year ago.   The problem occurs intermittently.  Associated symptoms include restlessness and sad.  Associated symptoms include no helplessness and no hopelessness.  Past medical history includes anxiety.   Anxiety Presents for follow-up visit. Symptoms include excessive worry, nervous/anxious behavior, obsessions, palpitations and restlessness. Symptoms occur most days. The severity of symptoms is moderate.    Nicotine Dependence Presents for follow-up visit. The symptoms have been stable. She smokes < 1/2 a pack of cigarettes per day.  Arthritis Presents  for follow-up visit. She complains of pain and stiffness. Affected locations include the right MCP and right hip. Her pain is at a severity of 1/10.  Constipation This is a chronic problem. The current episode started more than 1 year ago. The problem has been resolved since onset. Her stool frequency is 1 time per day. Associated symptoms include back pain. She has tried laxatives (miralax) for the symptoms. The treatment provided moderate relief.      Review of Systems  Cardiovascular:  Positive for palpitations.  Gastrointestinal:  Positive for constipation.  Musculoskeletal:  Positive for back pain and stiffness.  Psychiatric/Behavioral:  The patient is nervous/anxious.   All other systems reviewed and are negative.  Family History  Problem Relation Age of Onset   Brain cancer Mother    Cancer - Other Father        Abdominal    Colon cancer Sister    Colon cancer Paternal Grandmother    Breast cancer Neg Hx    Social History   Socioeconomic History   Marital status: Divorced    Spouse name: Not on file   Number of children: 2   Years of education: Not on file   Highest education level: Not on file  Occupational History   Occupation: disabled  Tobacco Use   Smoking status: Every Day  Current packs/day: 1.00    Average packs/day: 1 pack/day for 47.2 years (47.2 ttl pk-yrs)    Types: Cigarettes    Start date: 01/23/1976   Smokeless tobacco: Never  Vaping Use   Vaping status: Never Used  Substance and Sexual Activity   Alcohol use: No   Drug use: No   Sexual activity: Yes  Other Topics Concern   Not on file  Social History Narrative   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    Social Drivers of Corporate investment banker Strain: Not on file  Food Insecurity: No Food Insecurity (04/22/2023)   Hunger Vital Sign    Worried About Running Out of Food in the Last Year: Never true    Ran Out of Food in the Last Year: Never  true  Transportation Needs: Not on file  Physical Activity: Not on file  Stress: Not on file  Social Connections: Not on file        Objective:   Physical Exam Vitals reviewed.  Constitutional:      General: She is not in acute distress.    Appearance: She is well-developed.  HENT:     Head: Normocephalic and atraumatic.     Right Ear: Tympanic membrane normal.     Left Ear: Tympanic membrane normal.  Eyes:     Pupils: Pupils are equal, round, and reactive to light.  Neck:     Thyroid: No thyromegaly.  Cardiovascular:     Rate and Rhythm: Normal rate and regular rhythm.     Heart sounds: Normal heart sounds. No murmur heard. Pulmonary:     Effort: Pulmonary effort is normal. No respiratory distress.     Breath sounds: Normal breath sounds. No wheezing.  Abdominal:     General: Bowel sounds are normal. There is no distension.     Palpations: Abdomen is soft.     Tenderness: There is no abdominal tenderness.  Musculoskeletal:        General: No tenderness.     Cervical back: Normal range of motion and neck supple.  Skin:    General: Skin is warm and dry.  Neurological:     Mental Status: She is alert and oriented to person, place, and time.     Cranial Nerves: No cranial nerve deficit.     Deep Tendon Reflexes: Reflexes are normal and symmetric.  Psychiatric:        Mood and Affect: Mood is anxious. Affect is not tearful.        Behavior: Behavior normal.        Thought Content: Thought content normal.        Judgment: Judgment normal.     BP 108/72   Pulse 65   Temp 98.5 F (36.9 C) (Temporal)   Ht 5\' 3"  (1.6 m)   Wt 157 lb 3.2 oz (71.3 kg)   SpO2 94%   BMI 27.85 kg/m        Assessment & Plan:  [name] comes in today with chief complaint of Medical Management of Chronic Issues   Diagnosis and orders addressed:  1. Folic acid deficiency - folic acid (FOLVITE) 400 MCG tablet; Take 1 tablet (400 mcg total) by mouth daily.  Dispense: 90 tablet;  Refill: 3 - CMP14+EGFR - CBC with Differential/Platelet  2. Anxiety - gabapentin (NEURONTIN) 300 MG capsule; Take 1 capsule (300 mg total) by mouth 2 (two) times daily.  TAKE 1 CAPSULE BY MOUTH THREE TIMES A DAY  Dispense: 180 capsule; Refill: 1 - CMP14+EGFR - CBC with Differential/Platelet - TSH  3. Sciatica, unspecified laterality - gabapentin (NEURONTIN) 300 MG capsule; Take 1 capsule (300 mg total) by mouth 2 (two) times daily. TAKE 1 CAPSULE BY MOUTH THREE TIMES A DAY  Dispense: 180 capsule; Refill: 1 - CMP14+EGFR - CBC with Differential/Platelet  4. Decreased appetite - megestrol (MEGACE) 40 MG/ML suspension; TAKE 10 MLS (400 MG TOTAL) BY MOUTH DAILY.  Dispense: 300 mL; Refill: 2 - CMP14+EGFR - CBC with Differential/Platelet  5. Nausea - megestrol (MEGACE) 40 MG/ML suspension; TAKE 10 MLS (400 MG TOTAL) BY MOUTH DAILY.  Dispense: 300 mL; Refill: 2 - CMP14+EGFR - CBC with Differential/Platelet  6. Weight loss  - megestrol (MEGACE) 40 MG/ML suspension; TAKE 10 MLS (400 MG TOTAL) BY MOUTH DAILY.  Dispense: 300 mL; Refill: 2 - CMP14+EGFR - CBC with Differential/Platelet  7. Hyperlipidemia, unspecified hyperlipidemia type - rosuvastatin (CRESTOR) 5 MG tablet; Take 1 tablet (5 mg total) by mouth daily.  Dispense: 90 tablet; Refill: 0 - CMP14+EGFR - CBC with Differential/Platelet  8. Annual physical exam (Primary) - CMP14+EGFR - CBC with Differential/Platelet - Lipid panel - TSH  9. Aortic atherosclerosis (HCC)  - CMP14+EGFR - CBC with Differential/Platelet - Lipid panel  10. Simple chronic bronchitis (HCC)  - CMP14+EGFR - CBC with Differential/Platelet  11. Depression, major, single episode, moderate (HCC) - CMP14+EGFR - CBC with Differential/Platelet  12. Primary osteoarthritis involving multiple joints - CMP14+EGFR - CBC with Differential/Platelet  13. Tobacco user - CMP14+EGFR - CBC with Differential/Platelet  14. Constipation, unspecified  constipation type - CMP14+EGFR - CBC with Differential/Platelet - TSH  Continue current medications  Keep specialists follow up Labs pending Health Maintenance reviewed Diet and exercise encouraged  Follow up plan: 3 months    Jannifer Rodney, FNP

## 2023-04-23 LAB — CBC WITH DIFFERENTIAL/PLATELET
Basophils Absolute: 0 10*3/uL (ref 0.0–0.2)
Basos: 0 %
EOS (ABSOLUTE): 0.1 10*3/uL (ref 0.0–0.4)
Eos: 1 %
Hematocrit: 41.7 % (ref 34.0–46.6)
Hemoglobin: 14.1 g/dL (ref 11.1–15.9)
Immature Grans (Abs): 0.1 10*3/uL (ref 0.0–0.1)
Immature Granulocytes: 1 %
Lymphocytes Absolute: 4.4 10*3/uL — ABNORMAL HIGH (ref 0.7–3.1)
Lymphs: 42 %
MCH: 31.9 pg (ref 26.6–33.0)
MCHC: 33.8 g/dL (ref 31.5–35.7)
MCV: 94 fL (ref 79–97)
Monocytes Absolute: 0.7 10*3/uL (ref 0.1–0.9)
Monocytes: 6 %
Neutrophils Absolute: 5.3 10*3/uL (ref 1.4–7.0)
Neutrophils: 50 %
Platelets: 368 10*3/uL (ref 150–450)
RBC: 4.42 x10E6/uL (ref 3.77–5.28)
RDW: 12.5 % (ref 11.7–15.4)
WBC: 10.5 10*3/uL (ref 3.4–10.8)

## 2023-04-23 LAB — CMP14+EGFR
ALT: 12 IU/L (ref 0–32)
AST: 17 IU/L (ref 0–40)
Albumin: 4.2 g/dL (ref 3.9–4.9)
Alkaline Phosphatase: 71 IU/L (ref 44–121)
BUN/Creatinine Ratio: 8 — ABNORMAL LOW (ref 12–28)
BUN: 7 mg/dL — ABNORMAL LOW (ref 8–27)
Bilirubin Total: 0.5 mg/dL (ref 0.0–1.2)
CO2: 21 mmol/L (ref 20–29)
Calcium: 9.6 mg/dL (ref 8.7–10.3)
Chloride: 103 mmol/L (ref 96–106)
Creatinine, Ser: 0.91 mg/dL (ref 0.57–1.00)
Globulin, Total: 2.1 g/dL (ref 1.5–4.5)
Glucose: 79 mg/dL (ref 70–99)
Potassium: 4 mmol/L (ref 3.5–5.2)
Sodium: 139 mmol/L (ref 134–144)
Total Protein: 6.3 g/dL (ref 6.0–8.5)
eGFR: 71 mL/min/{1.73_m2} (ref >59)

## 2023-04-23 LAB — LIPID PANEL
Cholesterol, Total: 126 mg/dL (ref 100–199)
HDL: 30 mg/dL — ABNORMAL LOW (ref >39)
LDL CALC COMMENT:: 4.2 ratio (ref 0.0–4.4)
LDL Chol Calc (NIH): 76 mg/dL (ref 0–99)
Triglycerides: 104 mg/dL (ref 0–149)
VLDL Cholesterol Cal: 20 mg/dL (ref 5–40)

## 2023-04-23 LAB — TSH: TSH: 2.12 u[IU]/mL (ref 0.450–4.500)

## 2023-04-25 ENCOUNTER — Telehealth: Payer: Self-pay | Admitting: Family Medicine

## 2023-04-25 NOTE — Telephone Encounter (Signed)
 Appt scheduled for 05/21/2023

## 2023-05-02 DIAGNOSIS — R634 Abnormal weight loss: Secondary | ICD-10-CM | POA: Diagnosis not present

## 2023-05-07 ENCOUNTER — Ambulatory Visit (HOSPITAL_COMMUNITY)
Admission: RE | Admit: 2023-05-07 | Discharge: 2023-05-07 | Disposition: A | Discharge status: Home / Self Care | Source: Ambulatory Visit | Attending: Oncology | Admitting: Oncology

## 2023-05-07 DIAGNOSIS — Z87891 Personal history of nicotine dependence: Secondary | ICD-10-CM | POA: Diagnosis not present

## 2023-05-07 DIAGNOSIS — Z122 Encounter for screening for malignant neoplasm of respiratory organs: Secondary | ICD-10-CM | POA: Diagnosis not present

## 2023-05-07 DIAGNOSIS — F17210 Nicotine dependence, cigarettes, uncomplicated: Secondary | ICD-10-CM | POA: Diagnosis not present

## 2023-05-21 ENCOUNTER — Encounter: Admitting: Family

## 2023-05-28 DIAGNOSIS — R634 Abnormal weight loss: Secondary | ICD-10-CM | POA: Diagnosis not present

## 2023-06-12 DIAGNOSIS — M546 Pain in thoracic spine: Secondary | ICD-10-CM | POA: Diagnosis not present

## 2023-06-12 DIAGNOSIS — M542 Cervicalgia: Secondary | ICD-10-CM | POA: Diagnosis not present

## 2023-06-12 DIAGNOSIS — M5416 Radiculopathy, lumbar region: Secondary | ICD-10-CM | POA: Diagnosis not present

## 2023-06-24 DIAGNOSIS — R634 Abnormal weight loss: Secondary | ICD-10-CM | POA: Diagnosis not present

## 2023-07-15 ENCOUNTER — Other Ambulatory Visit: Payer: Self-pay | Admitting: Family

## 2023-07-15 DIAGNOSIS — R110 Nausea: Secondary | ICD-10-CM

## 2023-07-15 DIAGNOSIS — R634 Abnormal weight loss: Secondary | ICD-10-CM

## 2023-07-15 DIAGNOSIS — R630 Anorexia: Secondary | ICD-10-CM

## 2023-07-22 ENCOUNTER — Encounter: Payer: Self-pay | Admitting: *Deleted

## 2023-07-22 NOTE — Progress Notes (Signed)
Patient notified via mail of LDCT l ung cancer screening results with recommendations to follow up in 12 months.  Also notified of incidental findings and need to follow up with PCP.  Patient's referring provider was sent a copy of results.    IMPRESSION: Lung-RADS 2, benign appearance or behavior. Continue annual screening with low-dose chest CT without contrast in 12 months.  Aortic Atherosclerosis (ICD10-I70.0) and Emphysema (ICD10-J43.9).   

## 2023-07-23 DIAGNOSIS — R634 Abnormal weight loss: Secondary | ICD-10-CM | POA: Diagnosis not present

## 2023-07-29 ENCOUNTER — Ambulatory Visit: Payer: Self-pay

## 2023-07-29 NOTE — Telephone Encounter (Signed)
 Spoke with pt offered her to schedule an appt. Pt is aware that if she is seen health insurance may not cover this appt since it is dental related, she stated he has in the past I informed her that we can file insurance but if they did not pay she would be responsible out of pocket. I advised her to call her dentist. Pt said she would and if she did need an appt with [facility] she would call back

## 2023-07-29 NOTE — Telephone Encounter (Signed)
 PCP: Wants antibiotic called in for tooth pain.  States has been given antibiotic for this before.  Declined appointment.     Copied from CRM [id number]. Topic: Clinical - Red Word Triage >> Jul 29, 2023  2:16 PM Elle L wrote: Red Word that prompted transfer to Nurse Triage: The patient is experiencing severe pain from a tooth infection and is requesting amoxicillin  or penicillin be sent in for her. Reason for Disposition  [1] SEVERE pain (e.g., excruciating, unable to eat, unable to do any normal activities) AND [2] not improved 2 hours after pain medicine  Answer Assessment - Initial Assessment Questions 1. LOCATION: Which tooth is hurting?  (e.g., right-side/left-side, upper/lower, front/back)     Right side molar, states crown is falling off. 2. ONSET: When did the toothache start?  (e.g., hours, days)      Three days ago 3. SEVERITY: How bad is the toothache?  (Scale 1-10; mild, moderate or severe)   - MILD (1-3): doesn't interfere with chewing    - MODERATE (4-7): interferes with chewing, interferes with normal activities, awakens from sleep     - SEVERE (8-10): unable to eat, unable to do any normal activities, excruciating pain        Throbbing, severe 4. SWELLING: Is there any visible swelling of your face?     Swelling, red 5. OTHER SYMPTOMS: Do you have any other symptoms? (e.g., fever)     no 6. PREGNANCY: Is there any chance you are pregnant? When was your last menstrual period?     na  Protocols used: Toothache-A-AH

## 2023-08-21 ENCOUNTER — Ambulatory Visit: Payer: Self-pay

## 2023-08-21 NOTE — Telephone Encounter (Signed)
 I called and spoke with patient and got her scheduled to see DOD Alston Lunger) tomorrow.

## 2023-08-21 NOTE — Telephone Encounter (Signed)
 No sooner visits available, pt requesting sooner appt and/or antibiotic. Also requesting new Epi Pen (bee allergy), unable to pend.   FYI Only or Action Required?: Action required by provider: request for appointment, medication refill request, and clinical question for provider.  Patient was last seen in primary care on 04/22/2023 by Lavell Bari LABOR, FNP.  Called Nurse Triage reporting Folliculitis.  Symptoms began several months ago.  Interventions attempted: Prescription medications: See visit Feb 2025.  Symptoms are: unchanged.  Triage Disposition: See Physician Within 24 Hours  Patient/caregiver understands and will follow disposition?:  Reason for Disposition  [1] Treatment (antibiotic, I&D, or moist heat) > 24 hours AND [2] symptoms WORSE (e.g., pain, spreading redness, swelling)  Answer Assessment - Initial Assessment Questions States lesions often produce clear discharge and when clear up, leave a scar. States no improvement since visit in February. States saw dermatologist I don't know when and was advised to try diet changes, was unhappy with care there.  APPEARANCE: What does the boil (abscess) look like?      Lump that turns into a scar  LOCATION: Where is the boil located?      Legs, ears, buttox, scalp, face, arms, thighs  NUMBER: How many boils are there?      Many  ONSET: When did the boil start?     Several months before Feb 2025 appointment  FEVER:     Denies  Protocols used: Boil (Skin Abscess) on Treatment Follow-up Call-A-AH Copied from CRM #[id number]. Topic: Clinical - Medical Advice >> Aug 21, 2023  9:24 AM Tonda B wrote: Reason for CRM: patient has blister on her body that christy hawks has seen her before for and pt has a appointment 09/2023 she wants to know if she can get a rx for it until her appointment please call [phone]

## 2023-08-22 ENCOUNTER — Telehealth: Payer: Self-pay

## 2023-08-22 ENCOUNTER — Encounter: Payer: Self-pay | Admitting: Nurse Practitioner

## 2023-08-22 ENCOUNTER — Ambulatory Visit: Admitting: Nurse Practitioner

## 2023-08-22 VITALS — BP 109/74 | HR 90 | Temp 98.1°F | Ht 63.0 in | Wt 152.0 lb

## 2023-08-22 DIAGNOSIS — R21 Rash and other nonspecific skin eruption: Secondary | ICD-10-CM | POA: Diagnosis not present

## 2023-08-22 DIAGNOSIS — B958 Unspecified staphylococcus as the cause of diseases classified elsewhere: Secondary | ICD-10-CM

## 2023-08-22 MED ORDER — CIPROFLOXACIN HCL 500 MG PO TABS: 500.0000 mg | ORAL_TABLET | Freq: Two times a day (BID) | ORAL | 0 refills | Status: DC

## 2023-08-22 NOTE — Progress Notes (Signed)
 Subjective:    Patient ID: [name], female    DOB: October 08, 1960, 63 y.o.   MRN: [id number]   Chief Complaint: Red areas to face and legs   HPI  Patient comes in c/o red areas on face and legs. She has seen dermatologist but that did not really dx anything in particular. The rash is on her chin and lower ext. They are intermittently popping up all over her body. Dont really itch all that much. They start out as  looking like a bruise then get blistery .  Patient Active Problem List   Diagnosis Date Noted   Constipation 04/22/2023   Trigger finger of all digits of both hands 07/13/2022   Depression, major, single episode, moderate (HCC) 07/13/2022   Primary osteoarthritis involving multiple joints 07/13/2022   Chronic pain of right ankle 06/28/2022   Vitamin D  deficiency 07/20/2019   Aortic atherosclerosis (HCC) 03/28/2017   COPD (chronic obstructive pulmonary disease) (HCC) 12/10/2016   HSV-1 infection    Tobacco user    Anxiety    Hyperlipidemia    Sciatica    Back pain    Right leg pain    Hip pain    Postmenopausal atrophic vaginitis 04/15/2012   Pelvic pain in female 04/15/2012   HEMORRHAGE OF RECTUM AND ANUS 06/16/2009       Review of Systems  Constitutional:  Negative for diaphoresis.  Eyes:  Negative for pain.  Respiratory:  Negative for shortness of breath.   Cardiovascular:  Negative for chest pain, palpitations and leg swelling.  Gastrointestinal:  Negative for abdominal pain.  Endocrine: Negative for polydipsia.  Skin:  Negative for rash.  Neurological:  Negative for dizziness, weakness and headaches.  Hematological:  Does not bruise/bleed easily.  All other systems reviewed and are negative.      Objective:   Physical Exam Cardiovascular:     Rate and Rhythm: Normal rate and regular rhythm.     Heart sounds: Normal heart sounds.  Pulmonary:     Breath sounds: Normal breath sounds.  Skin:    General: Skin is warm.         Comments:  Scattered lesions on chin and lower ext. Some spots are macular bluish- erythematous- vesicles the scabbed over areas.  Neurological:     General: No focal deficit present.     Mental Status: She is oriented to person, place, and time.  Psychiatric:        Mood and Affect: Mood normal.        Behavior: Behavior normal.    BP 109/74   Pulse 90   Temp 98.1 F (36.7 C) (Temporal)   Ht 5' 3 (1.6 m)   Wt 152 lb (68.9 kg)   SpO2 97%   BMI 26.93 kg/m         Assessment & Plan:   [name] in today with chief complaint of Red areas to face and legs   1. Staph infection (Primary) Betadine body wash Avoid scratching or picking at lesions RTO prn if not improving - ciprofloxacin  (CIPRO ) 500 MG tablet; Take 1 tablet (500 mg total) by mouth 2 (two) times daily.  Dispense: 20 tablet; Refill: 0    The above assessment and management plan was discussed with the patient. The patient verbalized understanding of and has agreed to the management plan. Patient is aware to call the clinic if symptoms persist or worsen. Patient is aware when to return to the clinic for a follow-up  visit. Patient educated on when it is appropriate to go to the emergency department.   Mary-Margaret Gladis, FNP

## 2023-08-22 NOTE — Telephone Encounter (Signed)
 Copied from CRM [id number]. Topic: Clinical - Medication Question >> Aug 22, 2023 12:55 PM Santiya F wrote: Reason for CRM: Patient is calling in because she was seen in office and says she was told she was going to be prescribed betadine wash but the pharmacy gave her iodine, patient wants to know is she supposed to use that instead. She also says she was under the impression the provider was sending in a cream as well. Please advise.

## 2023-08-22 NOTE — Telephone Encounter (Signed)
 I spoke to pt and advised MMM sent in an oral antibiotic for her to take and she can find otc betadine body wash or a hibiclens  body wash and to use as directed and pt voiced understanding.

## 2023-08-23 DIAGNOSIS — R634 Abnormal weight loss: Secondary | ICD-10-CM | POA: Diagnosis not present

## 2023-09-16 DIAGNOSIS — M5416 Radiculopathy, lumbar region: Secondary | ICD-10-CM | POA: Diagnosis not present

## 2023-09-24 ENCOUNTER — Other Ambulatory Visit: Payer: Self-pay | Admitting: Family

## 2023-09-26 DIAGNOSIS — R634 Abnormal weight loss: Secondary | ICD-10-CM | POA: Diagnosis not present

## 2023-09-27 ENCOUNTER — Other Ambulatory Visit: Payer: Self-pay | Admitting: Family

## 2023-09-27 ENCOUNTER — Ambulatory Visit: Admitting: Family

## 2023-09-27 DIAGNOSIS — E785 Hyperlipidemia, unspecified: Secondary | ICD-10-CM

## 2023-10-01 ENCOUNTER — Encounter: Payer: Self-pay | Admitting: Family

## 2023-10-04 ENCOUNTER — Encounter: Payer: Self-pay | Admitting: Family

## 2023-10-04 ENCOUNTER — Ambulatory Visit: Admitting: Family

## 2023-10-04 ENCOUNTER — Other Ambulatory Visit: Payer: Self-pay

## 2023-10-04 VITALS — BP 113/69 | HR 55 | Temp 97.4°F | Ht 63.0 in | Wt 155.2 lb

## 2023-10-04 DIAGNOSIS — K5903 Drug induced constipation: Secondary | ICD-10-CM | POA: Diagnosis not present

## 2023-10-04 DIAGNOSIS — M7712 Lateral epicondylitis, left elbow: Secondary | ICD-10-CM

## 2023-10-04 DIAGNOSIS — M5442 Lumbago with sciatica, left side: Secondary | ICD-10-CM

## 2023-10-04 DIAGNOSIS — M5441 Lumbago with sciatica, right side: Secondary | ICD-10-CM | POA: Diagnosis not present

## 2023-10-04 DIAGNOSIS — R1084 Generalized abdominal pain: Secondary | ICD-10-CM | POA: Diagnosis not present

## 2023-10-04 DIAGNOSIS — G8929 Other chronic pain: Secondary | ICD-10-CM

## 2023-10-04 DIAGNOSIS — K589 Irritable bowel syndrome without diarrhea: Secondary | ICD-10-CM | POA: Diagnosis not present

## 2023-10-04 DIAGNOSIS — F419 Anxiety disorder, unspecified: Secondary | ICD-10-CM

## 2023-10-04 DIAGNOSIS — M5430 Sciatica, unspecified side: Secondary | ICD-10-CM

## 2023-10-04 MED ORDER — DICYCLOMINE HCL 10 MG PO CAPS: 10.0000 mg | ORAL_CAPSULE | Freq: Three times a day (TID) | ORAL | 1 refills | Status: DC

## 2023-10-04 MED ORDER — GABAPENTIN 300 MG PO CAPS: 300.0000 mg | ORAL_CAPSULE | Freq: Two times a day (BID) | ORAL | 1 refills | Status: DC

## 2023-10-04 MED ORDER — DICLOFENAC SODIUM 75 MG PO TBEC: 75.0000 mg | DELAYED_RELEASE_TABLET | Freq: Two times a day (BID) | ORAL | 2 refills | Status: DC

## 2023-10-04 NOTE — Progress Notes (Signed)
 Subjective:    Patient ID: [name], female    DOB: [birth year], 63 y.o.   MRN: [id number]  Chief Complaint  Patient presents with   DIVERTICULITIS FLARE UP   Elbow Pain    LEFT   HPI  Pt presents to the office today with generalized abdominal pain when she has to have a bowel movement. Reports episodes diarrhea then constipation.   Reports muscle spasms in her rectum when trying to have a bowel movements. Then will have back pain and its hard to sit on the toilet.    Complaining of left lateral elbow that started three days. Denies any repetitive motions. Reports aching pain of 4 out 10.   Review of Systems  All other systems reviewed and are negative.   Social History   Socioeconomic History   Marital status: Divorced    Spouse name: Not on file   Number of children: 2   Years of education: Not on file   Highest education level: Not on file  Occupational History   Occupation: disabled  Tobacco Use   Smoking status: Every Day    Current packs/day: 1.00    Average packs/day: 1 pack/day for 47.7 years (47.7 ttl pk-yrs)    Types: Cigarettes    Start date: 01/23/1976   Smokeless tobacco: Never  Vaping Use   Vaping status: Never Used  Substance and Sexual Activity   Alcohol use: No   Drug use: No   Sexual activity: Yes  Other Topics Concern   Not on file  Social History Narrative   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    Social Drivers of Corporate investment banker Strain: Not on file  Food Insecurity: No Food Insecurity (04/22/2023)   Hunger Vital Sign    Worried About Running Out of Food in the Last Year: Never true    Ran Out of Food in the Last Year: Never true  Transportation Needs: Not on file  Physical Activity: Not on file  Stress: Not on file  Social Connections: Not on file   Family History  Problem Relation Age of Onset   Brain cancer Mother    Cancer - Other Father        Abdominal    Colon  cancer Sister    Colon cancer Paternal Grandmother    Breast cancer Neg Hx         Objective:   Physical Exam Vitals reviewed.  Constitutional:      General: She is not in acute distress.    Appearance: She is well-developed.  HENT:     Head: Normocephalic and atraumatic.  Eyes:     Pupils: Pupils are equal, round, and reactive to light.  Neck:     Thyroid : No thyromegaly.  Cardiovascular:     Rate and Rhythm: Normal rate and regular rhythm.     Heart sounds: Normal heart sounds. No murmur heard. Pulmonary:     Effort: Pulmonary effort is normal. No respiratory distress.     Breath sounds: Normal breath sounds. No wheezing.  Abdominal:     General: Bowel sounds are normal. There is no distension.     Palpations: Abdomen is soft.     Tenderness: There is no abdominal tenderness.  Musculoskeletal:        General: No tenderness. Normal range of motion.     Cervical back: Normal range of motion and neck supple.  Comments: Full ROM of left elbow, pain in lateral with flexion  Skin:    General: Skin is warm and dry.  Neurological:     Mental Status: She is alert and oriented to person, place, and time.     Cranial Nerves: No cranial nerve deficit.     Deep Tendon Reflexes: Reflexes are normal and symmetric.  Psychiatric:        Behavior: Behavior normal.        Thought Content: Thought content normal.        Judgment: Judgment normal.       BP 113/69   Pulse (!) 55   Temp (!) 97.4 F (36.3 C)   Ht 5' 3 (1.6 m)   Wt 155 lb 3.2 oz (70.4 kg)   SpO2 98%   BMI 27.49 kg/m      Assessment & Plan:  MOON BUDDE comes in today with chief complaint of DIVERTICULITIS FLARE UP and Elbow Pain (LEFT)   Diagnosis and orders addressed:  1. Generalized abdominal pain (Primary) - Ambulatory referral to Gastroenterology - CBC with Differential/Platelet  2. Drug-induced constipation - Ambulatory referral to Gastroenterology - CBC with Differential/Platelet  3.  Chronic bilateral low back pain with bilateral sciatica - CBC with Differential/Platelet  4. Spasm of bowel - dicyclomine  (BENTYL ) 10 MG capsule; Take 1 capsule (10 mg total) by mouth 4 (four) times daily -  before meals and at bedtime.  Dispense: 120 capsule; Refill: 1 - CBC with Differential/Platelet   5. Lateral epicondylitis of left elbow Rest Ice ROM exercises Start diclofenac  BID with food, no other NSAID's - diclofenac  (VOLTAREN ) 75 MG EC tablet; Take 1 tablet (75 mg total) by mouth 2 (two) times daily.  Dispense: 60 tablet; Refill: 2   Start Bentyl   Referral to GI pending  Continue miralax   Healthy diet encouraged Start Diclofenac  BID for 7 days for elbow, no other NSAID's CBC pending to rule out infection Follow up if symptoms worsen or do not improve        Bari Learn, FNP

## 2023-10-04 NOTE — Patient Instructions (Signed)
Irritable Bowel Syndrome, Adult  Irritable bowel syndrome (IBS) is a group of symptoms that affects the organs responsible for digestion (gastrointestinal tract, or GI tract). IBS is not one specific disease. To regulate how the GI tract works, the body sends signals back and forth between the intestines and the brain. If you have IBS, there may be a problem with these signals. As a result, the GI tract does not function normally. The intestines may become more sensitive and overreact to certain things. This may be especially true when you eat certain foods or when you are under stress. There are four main types of IBS. These may be determined based on the consistency of your stool (feces): IBS with mostly (predominance of) diarrhea. IBS with predominance of constipation. IBS with mixed bowel habits. This includes both diarrhea and constipation. IBS unclassified. This includes IBS that cannot be categorized into one of the other three main types. It is important to know which type of IBS you have. Certain treatments are more likely to be helpful for certain types of IBS. What are the causes? The exact cause of IBS is not known. What increases the risk? You may have a higher risk for IBS if you: Are female. Are younger than 40 years. Have a family history of IBS. Have a mental health condition, such as depression, anxiety, or post-traumatic stress disorder. Have had a bacterial infection of your GI tract. What are the signs or symptoms? Symptoms of IBS vary from person to person. The main symptom is abdominal pain or discomfort. Other symptoms usually include one or more of the following: Diarrhea, constipation, or both. Swelling or bloating in the abdomen. Feeling full after eating a small or regular-sized meal. Frequent gas. Mucus in the stool. A feeling of having more stool left after a bowel movement. Symptoms tend to come and go. They may be triggered by stress, mental health  conditions, or certain foods. How is this diagnosed? This condition may be diagnosed based on a physical exam, your medical history, and your symptoms. You may have tests, such as: Blood tests. Stool test. Colonoscopy. This is a procedure in which your GI tract is viewed with a long, thin, flexible tube. How is this treated? There is no cure for IBS, but treatment can help relieve symptoms. Treatment depends on the type of IBS you have, and may include: Changes to your diet, such as: Avoiding foods that cause symptoms. Drinking more water. Following a low-FODMAP (fermentable oligosaccharides, disaccharides, monosaccharides, and polyols) diet for up to 6 weeks, or as told by your health care provider. FODMAPs are sugars that are hard for some people to digest. Eating more fiber. Eating small meals at the same times every day. Medicines. These may include: Fiber supplements, if you have constipation. Medicine to control diarrhea (antidiarrheal medicines). Medicine to help control muscle tightening (spasms) in your GI tract (antispasmodic medicines). Medicines to help with mental health conditions, such as antidepressants. Talk therapy or counseling. Working with a dietitian to help create a food plan that is right for you. Managing your stress. Follow these instructions at home: Eating and drinking  Eat a healthy diet. Eat 5-6 small meals a day. Try to eat meals at about the same times each day. Do not eat large meals. Gradually eat more fiber-rich foods. These include whole grains, fruits, and vegetables. This may be especially helpful if you have IBS with constipation. Eat a diet low in FODMAPs. You may need to avoid foods such as   citrus fruits, cabbage, garlic, and onions. Drink enough fluid to keep your urine pale yellow. Keep a journal of foods that seem to trigger symptoms. Avoid foods and drinks that: Contain added sugar. Make your symptoms worse. These may include dairy  products, caffeinated drinks, and carbonated drinks. Alcohol use Do not drink alcohol if: Your health care provider tells you not to drink. You are pregnant, may be pregnant, or are planning to become pregnant. If you drink alcohol: Limit how much you have to: 0-1 drink a day for women. 0-2 drinks a day for men. Know how much alcohol is in your drink. In the [place] one drink equals one 12 oz bottle of beer (355 mL), one 5 oz glass of wine (148 mL), or one 1 oz glass of hard liquor (44 mL) General instructions Take over-the-counter and prescription medicines only as told by your health care provider. This includes supplements. Get enough exercise. Do at least 150 minutes of moderate-intensity exercise each week. Manage your stress. Getting enough sleep and exercise can help you manage stress. Keep all follow-up visits. This is important. This includes all visits with your health care provider and therapist. Where to find more information International Foundation for Functional Gastrointestinal Disorders: aboutibs.org National Institute of Diabetes and Digestive and Kidney Diseases: niddk.nih.gov Contact a health care provider if: You have constant pain. You lose weight. You have diarrhea that gets worse. You have bleeding from the rectum. You vomit often. You have a fever. Get help right away if: You have severe abdominal pain. You have diarrhea with symptoms of dehydration, such as dizziness or dry mouth. You have bloody or black stools. You have severe abdominal bloating. You have vomiting that does not stop. You have blood in your vomit. Summary Irritable bowel syndrome (IBS) is not one specific disease. It is a group of symptoms that affects digestion. Your intestines may become more sensitive and overreact to certain things. This may be especially true when you eat certain foods or when you are under stress. There is no cure for IBS, but treatment can help relieve  symptoms. This information is not intended to replace advice given to you by your health care provider. Make sure you discuss any questions you have with your health care provider. Document Revised: 12/21/2020 Document Reviewed: 12/21/2020 Elsevier Patient Education  2024 Elsevier Inc.  

## 2023-10-08 ENCOUNTER — Other Ambulatory Visit

## 2023-10-27 ENCOUNTER — Other Ambulatory Visit: Payer: Self-pay | Admitting: Family

## 2023-10-27 DIAGNOSIS — K589 Irritable bowel syndrome without diarrhea: Secondary | ICD-10-CM

## 2023-11-04 ENCOUNTER — Other Ambulatory Visit: Payer: Self-pay | Admitting: Family

## 2023-11-04 DIAGNOSIS — K5900 Constipation, unspecified: Secondary | ICD-10-CM

## 2023-11-25 DIAGNOSIS — R634 Abnormal weight loss: Secondary | ICD-10-CM | POA: Diagnosis not present

## 2023-12-09 DIAGNOSIS — M542 Cervicalgia: Secondary | ICD-10-CM | POA: Diagnosis not present

## 2023-12-09 DIAGNOSIS — M546 Pain in thoracic spine: Secondary | ICD-10-CM | POA: Diagnosis not present

## 2023-12-09 DIAGNOSIS — M5416 Radiculopathy, lumbar region: Secondary | ICD-10-CM | POA: Diagnosis not present

## 2023-12-23 DIAGNOSIS — R634 Abnormal weight loss: Secondary | ICD-10-CM | POA: Diagnosis not present

## 2023-12-30 ENCOUNTER — Other Ambulatory Visit: Payer: Self-pay | Admitting: Family

## 2023-12-30 DIAGNOSIS — E785 Hyperlipidemia, unspecified: Secondary | ICD-10-CM

## 2024-01-20 DIAGNOSIS — R634 Abnormal weight loss: Secondary | ICD-10-CM | POA: Diagnosis not present

## 2024-01-25 ENCOUNTER — Other Ambulatory Visit: Payer: Self-pay | Admitting: Family

## 2024-01-25 DIAGNOSIS — E785 Hyperlipidemia, unspecified: Secondary | ICD-10-CM

## 2024-01-27 DIAGNOSIS — E785 Hyperlipidemia, unspecified: Secondary | ICD-10-CM

## 2024-01-27 MED ORDER — ROSUVASTATIN CALCIUM 5 MG PO TABS: 5.0000 mg | ORAL_TABLET | Freq: Every day | ORAL | 0 refills | Status: DC

## 2024-01-27 NOTE — Telephone Encounter (Signed)
 Christy pt NTBS 30-d given 12/30/23

## 2024-01-27 NOTE — Addendum Note (Signed)
 Addended by: INA RAMP D on: 01/27/2024 11:57 AM   Modules accepted: Orders

## 2024-01-27 NOTE — Telephone Encounter (Signed)
 I called pt & made her an appt on 02-03-2024 w/[facility] for Med refill.

## 2024-01-29 ENCOUNTER — Other Ambulatory Visit: Payer: Self-pay | Admitting: Family

## 2024-01-29 DIAGNOSIS — K589 Irritable bowel syndrome without diarrhea: Secondary | ICD-10-CM

## 2024-01-31 ENCOUNTER — Other Ambulatory Visit: Payer: Self-pay

## 2024-01-31 DIAGNOSIS — F17210 Nicotine dependence, cigarettes, uncomplicated: Secondary | ICD-10-CM

## 2024-01-31 DIAGNOSIS — Z87891 Personal history of nicotine dependence: Secondary | ICD-10-CM

## 2024-01-31 DIAGNOSIS — Z122 Encounter for screening for malignant neoplasm of respiratory organs: Secondary | ICD-10-CM

## 2024-02-03 ENCOUNTER — Encounter: Payer: Self-pay | Admitting: Family

## 2024-02-03 ENCOUNTER — Ambulatory Visit: Admitting: Family

## 2024-02-03 VITALS — BP 108/71 | HR 56 | Temp 97.5°F | Ht 63.0 in | Wt 145.4 lb

## 2024-02-03 DIAGNOSIS — Z716 Tobacco abuse counseling: Secondary | ICD-10-CM

## 2024-02-03 DIAGNOSIS — I700 Atherosclerosis of aorta: Secondary | ICD-10-CM

## 2024-02-03 DIAGNOSIS — Z720 Tobacco use: Secondary | ICD-10-CM | POA: Diagnosis not present

## 2024-02-03 DIAGNOSIS — F321 Major depressive disorder, single episode, moderate: Secondary | ICD-10-CM | POA: Diagnosis not present

## 2024-02-03 DIAGNOSIS — M150 Primary generalized (osteo)arthritis: Secondary | ICD-10-CM

## 2024-02-03 DIAGNOSIS — M5442 Lumbago with sciatica, left side: Secondary | ICD-10-CM | POA: Diagnosis not present

## 2024-02-03 DIAGNOSIS — M5441 Lumbago with sciatica, right side: Secondary | ICD-10-CM

## 2024-02-03 DIAGNOSIS — F419 Anxiety disorder, unspecified: Secondary | ICD-10-CM | POA: Diagnosis not present

## 2024-02-03 DIAGNOSIS — J410 Simple chronic bronchitis: Secondary | ICD-10-CM

## 2024-02-03 DIAGNOSIS — K5903 Drug induced constipation: Secondary | ICD-10-CM

## 2024-02-03 DIAGNOSIS — E785 Hyperlipidemia, unspecified: Secondary | ICD-10-CM

## 2024-02-03 DIAGNOSIS — G8929 Other chronic pain: Secondary | ICD-10-CM | POA: Diagnosis not present

## 2024-02-03 DIAGNOSIS — R110 Nausea: Secondary | ICD-10-CM | POA: Diagnosis not present

## 2024-02-03 DIAGNOSIS — R630 Anorexia: Secondary | ICD-10-CM | POA: Diagnosis not present

## 2024-02-03 DIAGNOSIS — R634 Abnormal weight loss: Secondary | ICD-10-CM

## 2024-02-03 MED ORDER — LIDOCAINE 5 % EX PTCH: 1.0000 | MEDICATED_PATCH | CUTANEOUS | 2 refills | Status: AC

## 2024-02-03 MED ORDER — DULERA 200-5 MCG/ACT IN AERO: 2.0000 | INHALATION_SPRAY | Freq: Every day | RESPIRATORY_TRACT | 2 refills | Status: AC

## 2024-02-03 MED ORDER — DICLOFENAC SODIUM 75 MG PO TBEC: 75.0000 mg | DELAYED_RELEASE_TABLET | Freq: Two times a day (BID) | ORAL | 2 refills | Status: AC

## 2024-02-03 MED ORDER — GABAPENTIN 300 MG PO CAPS: 300.0000 mg | ORAL_CAPSULE | Freq: Two times a day (BID) | ORAL | 1 refills | Status: AC

## 2024-02-03 MED ORDER — ROSUVASTATIN CALCIUM 5 MG PO TABS: 5.0000 mg | ORAL_TABLET | Freq: Every day | ORAL | 2 refills | Status: AC

## 2024-02-03 MED ORDER — NICOTINE 21 MG/24HR TD PT24: 21.0000 mg | MEDICATED_PATCH | Freq: Every day | TRANSDERMAL | 2 refills | Status: AC

## 2024-02-03 MED ORDER — MEGESTROL ACETATE 40 MG/ML PO SUSP: ORAL | 1 refills | Status: AC

## 2024-02-03 MED ORDER — HYDROXYZINE HCL 25 MG PO TABS: 25.0000 mg | ORAL_TABLET | Freq: Four times a day (QID) | ORAL | 1 refills | Status: AC | PRN

## 2024-02-03 NOTE — Patient Instructions (Signed)

## 2024-02-03 NOTE — Progress Notes (Signed)
 "  Subjective:    Patient ID: [name], female    DOB: [birth year], 64 y.o.   MRN: [id number]  Chief Complaint  Patient presents with   Medical Management of Chronic Issues   Pt presents to the office today chronic follow up.    She is followed by Pain Clinic 2 months for chronic back pain.     She has COPD and is a  1/2 - 1 pack smoker for the last 30 years. She reports she has intermittent SOB. She using dulera . Wants to start nicotine  patches.    She has aortic atherosclerosis and hyperlipidemia. She is taking Crestor .    She saw [facility], but did not like them and stopped.   PT takes megace  for appetite for weight loss.      02/03/2024   10:57 AM 10/04/2023    8:29 AM 08/22/2023   10:43 AM  Last 3 Weights  Weight (lbs) 145 lb 6.4 oz 155 lb 3.2 oz 152 lb  Weight (kg) 65.953 kg 70.398 kg 68.947 kg     Back Pain This is a chronic problem. The current episode started more than 1 year ago. The problem occurs intermittently. The problem has been waxing and waning since onset. The pain is present in the lumbar spine. The quality of the pain is described as aching. The pain is at a severity of 5/10. The pain is moderate. The symptoms are aggravated by twisting and bending. She has tried analgesics for the symptoms. The treatment provided moderate relief.  Depression        This is a chronic problem.  The current episode started more than 1 year ago.   The problem occurs intermittently.  Associated symptoms include restlessness.  Associated symptoms include no helplessness, no hopelessness and not sad.  Past treatments include nothing.  Past medical history includes anxiety.   Anxiety Presents for follow-up visit. Symptoms include excessive worry, nervous/anxious behavior, obsessions, palpitations and restlessness. Symptoms occur most days. The severity of symptoms is moderate.    Nicotine  Dependence Presents for follow-up visit. The symptoms have been stable. She smokes  1 pack of cigarettes per day.  Arthritis Presents for follow-up visit. She complains of pain and stiffness. Affected locations include the right hip and left hip. Her pain is at a severity of 4/10.  Constipation This is a chronic problem. The current episode started more than 1 year ago. The problem has been resolved since onset. Her stool frequency is 1 time per day. Associated symptoms include back pain. She has tried laxatives (miralax ) for the symptoms. The treatment provided moderate relief.      Review of Systems  Cardiovascular:  Positive for palpitations.  Gastrointestinal:  Positive for constipation.  Musculoskeletal:  Positive for back pain and stiffness.  Psychiatric/Behavioral:  The patient is nervous/anxious.   All other systems reviewed and are negative.  Family History  Problem Relation Age of Onset   Brain cancer Mother    Cancer - Other Father        Abdominal    Colon cancer Sister    Colon cancer Paternal Grandmother    Breast cancer Neg Hx    Social History   Socioeconomic History   Marital status: Divorced    Spouse name: Not on file   Number of children: 2   Years of education: Not on file   Highest education level: Not on file  Occupational History   Occupation: disabled  Tobacco Use   Smoking  status: Every Day    Current packs/day: 1.00    Average packs/day: 1 pack/day for 48.0 years (48.0 ttl pk-yrs)    Types: Cigarettes    Start date: 01/23/1976   Smokeless tobacco: Never  Vaping Use   Vaping status: Never Used  Substance and Sexual Activity   Alcohol use: No   Drug use: No   Sexual activity: Yes  Other Topics Concern   Not on file  Social History Narrative   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    Social Drivers of Health   Tobacco Use: High Risk (02/03/2024)   Patient History    Smoking Tobacco Use: Every Day    Smokeless Tobacco Use: Never    Passive Exposure: Not on file  Financial  Resource Strain: Not on file  Food Insecurity: No Food Insecurity (04/22/2023)   Hunger Vital Sign    Worried About Running Out of Food in the Last Year: Never true    Ran Out of Food in the Last Year: Never true  Transportation Needs: Not on file  Physical Activity: Not on file  Stress: Not on file  Social Connections: Not on file  Depression (PHQ2-9): High Risk (02/03/2024)   Depression (PHQ2-9)    PHQ-2 Score: 13  Alcohol Screen: Not on file  Housing: Unknown (04/22/2023)   Housing Stability Vital Sign    Unable to Pay for Housing in the Last Year: No    Number of Times Moved in the Last Year: Not on file    Homeless in the Last Year: No  Utilities: Not on file  Health Literacy: Not on file        Objective:   Physical Exam Vitals reviewed.  Constitutional:      General: She is not in acute distress.    Appearance: She is well-developed.  HENT:     Head: Normocephalic and atraumatic.     Right Ear: Tympanic membrane normal.     Left Ear: Tympanic membrane normal.  Eyes:     Pupils: Pupils are equal, round, and reactive to light.  Neck:     Thyroid : No thyromegaly.  Cardiovascular:     Rate and Rhythm: Normal rate and regular rhythm.     Heart sounds: Normal heart sounds. No murmur heard. Pulmonary:     Effort: Pulmonary effort is normal. No respiratory distress.     Breath sounds: Rhonchi present. No wheezing.  Abdominal:     General: Bowel sounds are normal. There is no distension.     Palpations: Abdomen is soft.     Tenderness: There is no abdominal tenderness.  Musculoskeletal:        General: No tenderness.     Cervical back: Normal range of motion and neck supple.  Skin:    General: Skin is warm and dry.  Neurological:     Mental Status: She is alert and oriented to person, place, and time.     Cranial Nerves: No cranial nerve deficit.     Deep Tendon Reflexes: Reflexes are normal and symmetric.  Psychiatric:        Mood and Affect: Mood is anxious.  Affect is not tearful.        Behavior: Behavior normal.        Thought Content: Thought content normal.        Judgment: Judgment normal.     BP 108/71   Pulse (!) 56  Temp (!) 97.5 F (36.4 C) (Temporal)   Ht 5' 3 (1.6 m)   Wt 145 lb 6.4 oz (66 kg)   SpO2 96%   BMI 25.76 kg/m        Assessment & Plan:  [name] comes in today with chief complaint of Medical Management of Chronic Issues   Diagnosis and orders addressed:  1. Anxiety - gabapentin  (NEURONTIN ) 300 MG capsule; Take 1 capsule (300 mg total) by mouth 2 (two) times daily.  Dispense: 180 capsule; Refill: 1 - hydrOXYzine  (ATARAX ) 25 MG tablet; Take 1 tablet (25 mg total) by mouth every 6 (six) hours as needed.  Dispense: 90 tablet; Refill: 1 - CMP14+EGFR  2. Decreased appetite - megestrol  (MEGACE ) 40 MG/ML suspension; TAKE 10 MLS (400 MG TOTAL) BY MOUTH DAILY.  Dispense: 300 mL; Refill: 1 - CMP14+EGFR  3. Nausea - megestrol  (MEGACE ) 40 MG/ML suspension; TAKE 10 MLS (400 MG TOTAL) BY MOUTH DAILY.  Dispense: 300 mL; Refill: 1 - CMP14+EGFR  4. Weight loss - megestrol  (MEGACE ) 40 MG/ML suspension; TAKE 10 MLS (400 MG TOTAL) BY MOUTH DAILY.  Dispense: 300 mL; Refill: 1 - CMP14+EGFR  5. Hyperlipidemia, unspecified hyperlipidemia type - rosuvastatin  (CRESTOR ) 5 MG tablet; Take 1 tablet (5 mg total) by mouth daily.  Dispense: 90 tablet; Refill: 2 - CMP14+EGFR  6. Tobacco user - CMP14+EGFR - nicotine  (NICODERM CQ  - DOSED IN MG/24 HOURS) 21 mg/24hr patch; Place 1 patch (21 mg total) onto the skin daily.  Dispense: 28 patch; Refill: 2  7. Primary osteoarthritis involving multiple joints - CMP14+EGFR  8. Simple chronic bronchitis (HCC) - CMP14+EGFR - nicotine  (NICODERM CQ  - DOSED IN MG/24 HOURS) 21 mg/24hr patch; Place 1 patch (21 mg total) onto the skin daily.  Dispense: 28 patch; Refill: 2  9. Aortic atherosclerosis - CMP14+EGFR  10. Depression, major, single episode, moderate (HCC) -  CMP14+EGFR  11. Drug-induced constipation - CMP14+EGFR  12. Chronic bilateral low back pain with bilateral sciatica (Primary) - diclofenac  (VOLTAREN ) 75 MG EC tablet; Take 1 tablet (75 mg total) by mouth 2 (two) times daily.  Dispense: 60 tablet; Refill: 2 - CMP14+EGFR - lidocaine  (LIDODERM ) 5 %; Place 1 patch onto the skin daily. Remove & Discard patch within 12 hours or as directed by MD  Dispense: 30 patch; Refill: 2  13. Encounter for smoking cessation counseling Start Nicotine  patch 21 mg for at least 1-2 months. Then we can decrease to 14 mg.  Smoking cessation discussed Stress management  Education given, handout given also. Approx 3 mins spent on education.  - CMP14+EGFR - nicotine  (NICODERM CQ  - DOSED IN MG/24 HOURS) 21 mg/24hr patch; Place 1 patch (21 mg total) onto the skin daily.  Dispense: 28 patch; Refill: 2   Continue current medications  Keep specialists follow up Labs pending Health Maintenance reviewed Diet and exercise encouraged  Follow up plan: 3 months    Bari Learn, FNP   "

## 2024-02-04 ENCOUNTER — Other Ambulatory Visit

## 2024-05-05 ENCOUNTER — Ambulatory Visit: Admitting: Family
